# Patient Record
Sex: Male | Born: 1945 | Race: White | Hispanic: No | Smoking: Current every day smoker
Health system: Southern US, Community
[De-identification: ages and names within clinical notes are randomized; demographics above are authoritative.]

## PROBLEM LIST (undated history)

## (undated) DIAGNOSIS — R200 Anesthesia of skin: Secondary | ICD-10-CM

## (undated) DIAGNOSIS — I10 Essential (primary) hypertension: Secondary | ICD-10-CM

## (undated) DIAGNOSIS — F329 Major depressive disorder, single episode, unspecified: Secondary | ICD-10-CM

## (undated) DIAGNOSIS — I639 Cerebral infarction, unspecified: Secondary | ICD-10-CM

## (undated) DIAGNOSIS — J45909 Unspecified asthma, uncomplicated: Secondary | ICD-10-CM

## (undated) DIAGNOSIS — F32A Depression, unspecified: Secondary | ICD-10-CM

## (undated) DIAGNOSIS — X838XXA Intentional self-harm by other specified means, initial encounter: Secondary | ICD-10-CM

## (undated) HISTORY — PX: SHOULDER ARTHROSCOPY: SHX128

## (undated) HISTORY — PX: APPENDECTOMY: SHX54

## (undated) HISTORY — PX: TONSILLECTOMY: SUR1361

---

## 2005-05-19 ENCOUNTER — Ambulatory Visit (HOSPITAL_COMMUNITY): Admission: RE | Admit: 2005-05-19 | Discharge: 2005-05-19 | Payer: Self-pay | Admitting: Orthopedic Surgery

## 2005-05-19 ENCOUNTER — Ambulatory Visit (HOSPITAL_BASED_OUTPATIENT_CLINIC_OR_DEPARTMENT_OTHER): Admission: RE | Admit: 2005-05-19 | Discharge: 2005-05-19 | Payer: Self-pay | Admitting: Orthopedic Surgery

## 2010-07-29 ENCOUNTER — Inpatient Hospital Stay (HOSPITAL_COMMUNITY)
Admission: EM | Admit: 2010-07-29 | Discharge: 2010-08-04 | Disposition: A | Discharge status: Other Acute Inpatient Hospital | Payer: Self-pay | Source: Home / Self Care | Admitting: Emergency Medicine

## 2010-07-30 DIAGNOSIS — F329 Major depressive disorder, single episode, unspecified: Secondary | ICD-10-CM

## 2010-07-31 DIAGNOSIS — F329 Major depressive disorder, single episode, unspecified: Secondary | ICD-10-CM

## 2010-12-08 LAB — DIFFERENTIAL
Basophils Absolute: 0.1 10*3/uL (ref 0.0–0.1)
Eosinophils Relative: 3 % (ref 0–5)
Lymphocytes Relative: 27 % (ref 12–46)
Lymphs Abs: 3 10*3/uL (ref 0.7–4.0)
Neutro Abs: 7 10*3/uL (ref 1.7–7.7)
Neutrophils Relative %: 62 % (ref 43–77)

## 2010-12-08 LAB — MRSA CULTURE

## 2010-12-08 LAB — COMPREHENSIVE METABOLIC PANEL
ALT: 32 U/L (ref 0–53)
ALT: 33 U/L (ref 0–53)
ALT: 43 U/L (ref 0–53)
AST: 30 U/L (ref 0–37)
AST: 45 U/L — ABNORMAL HIGH (ref 0–37)
Albumin: 3.1 g/dL — ABNORMAL LOW (ref 3.5–5.2)
Alkaline Phosphatase: 67 U/L (ref 39–117)
Alkaline Phosphatase: 68 U/L (ref 39–117)
Alkaline Phosphatase: 80 U/L (ref 39–117)
BUN: 7 mg/dL (ref 6–23)
BUN: 9 mg/dL (ref 6–23)
BUN: 9 mg/dL (ref 6–23)
CO2: 24 mEq/L (ref 19–32)
CO2: 25 mEq/L (ref 19–32)
CO2: 29 mEq/L (ref 19–32)
CO2: 29 mEq/L (ref 19–32)
Calcium: 8.2 mg/dL — ABNORMAL LOW (ref 8.4–10.5)
Calcium: 8.9 mg/dL (ref 8.4–10.5)
Chloride: 102 mEq/L (ref 96–112)
Chloride: 104 mEq/L (ref 96–112)
Creatinine, Ser: 1.02 mg/dL (ref 0.4–1.5)
Creatinine, Ser: 1.05 mg/dL (ref 0.4–1.5)
GFR calc Af Amer: >60 mL/min (ref >60)
GFR calc Af Amer: >60 mL/min (ref >60)
GFR calc non Af Amer: >60 mL/min (ref >60)
GFR calc non Af Amer: >60 mL/min (ref >60)
GFR calc non Af Amer: >60 mL/min (ref >60)
Glucose, Bld: 141 mg/dL — ABNORMAL HIGH (ref 70–99)
Glucose, Bld: 168 mg/dL — ABNORMAL HIGH (ref 70–99)
Potassium: 3.8 mEq/L (ref 3.5–5.1)
Potassium: 4.5 mEq/L (ref 3.5–5.1)
Sodium: 137 mEq/L (ref 135–145)
Sodium: 138 mEq/L (ref 135–145)
Total Bilirubin: 0.4 mg/dL (ref 0.3–1.2)
Total Bilirubin: 0.8 mg/dL (ref 0.3–1.2)
Total Bilirubin: 0.9 mg/dL (ref 0.3–1.2)
Total Protein: 6 g/dL (ref 6.0–8.3)
Total Protein: 6.4 g/dL (ref 6.0–8.3)

## 2010-12-08 LAB — GLUCOSE, CAPILLARY
Glucose-Capillary: 154 mg/dL — ABNORMAL HIGH (ref 70–99)
Glucose-Capillary: 178 mg/dL — ABNORMAL HIGH (ref 70–99)
Glucose-Capillary: 215 mg/dL — ABNORMAL HIGH (ref 70–99)

## 2010-12-08 LAB — PROTIME-INR
INR: 0.97 (ref 0.00–1.49)
INR: 1.05 (ref 0.00–1.49)
INR: 1.13 (ref 0.00–1.49)
Prothrombin Time: 13 seconds (ref 11.6–15.2)
Prothrombin Time: 13.1 seconds (ref 11.6–15.2)
Prothrombin Time: 14.7 seconds (ref 11.6–15.2)

## 2010-12-08 LAB — HEPATIC FUNCTION PANEL
ALT: 35 U/L (ref 0–53)
AST: 28 U/L (ref 0–37)
Albumin: 3 g/dL — ABNORMAL LOW (ref 3.5–5.2)
Total Protein: 6.1 g/dL (ref 6.0–8.3)

## 2010-12-08 LAB — CBC
HCT: 45.5 % (ref 39.0–52.0)
Hemoglobin: 15.9 g/dL (ref 13.0–17.0)
Hemoglobin: 17.1 g/dL — ABNORMAL HIGH (ref 13.0–17.0)
MCH: 33.8 pg (ref 26.0–34.0)
MCHC: 35.3 g/dL (ref 30.0–36.0)
MCV: 95.9 fL (ref 78.0–100.0)
RBC: 4.76 MIL/uL (ref 4.22–5.81)
RDW: 12.2 % (ref 11.5–15.5)
WBC: 11.3 10*3/uL — ABNORMAL HIGH (ref 4.0–10.5)

## 2010-12-08 LAB — URINALYSIS, ROUTINE W REFLEX MICROSCOPIC
Bilirubin Urine: NEGATIVE
Glucose, UA: NEGATIVE mg/dL
Hgb urine dipstick: NEGATIVE
Specific Gravity, Urine: 1.019 (ref 1.005–1.030)
Urobilinogen, UA: 0.2 mg/dL (ref 0.0–1.0)
pH: 5.5 (ref 5.0–8.0)

## 2010-12-08 LAB — RAPID URINE DRUG SCREEN, HOSP PERFORMED
Barbiturates: NOT DETECTED
Opiates: POSITIVE — AB

## 2010-12-08 LAB — ACETAMINOPHEN LEVEL
Acetaminophen (Tylenol), Serum: 106.4 ug/mL — ABNORMAL HIGH (ref 10–30)
Acetaminophen (Tylenol), Serum: 90.9 ug/mL — ABNORMAL HIGH (ref 10–30)
Acetaminophen (Tylenol), Serum: <10 ug/mL — ABNORMAL LOW (ref 10–30)
Acetaminophen (Tylenol), Serum: <10 ug/mL — ABNORMAL LOW (ref 10–30)

## 2010-12-08 LAB — BASIC METABOLIC PANEL
CO2: 22 mEq/L (ref 19–32)
Calcium: 8.5 mg/dL (ref 8.4–10.5)
Creatinine, Ser: 1.03 mg/dL (ref 0.4–1.5)
GFR calc non Af Amer: >60 mL/min (ref >60)
Glucose, Bld: 157 mg/dL — ABNORMAL HIGH (ref 70–99)

## 2010-12-08 LAB — MRSA PCR SCREENING

## 2010-12-08 LAB — SALICYLATE LEVEL: Salicylate Lvl: <4 mg/dL (ref 2.8–20.0)

## 2010-12-08 LAB — TSH: TSH: 3.379 u[IU]/mL (ref 0.350–4.500)

## 2011-02-12 NOTE — Op Note (Signed)
NAMEDRAEDYN, WEIDINGER                ACCOUNT NO.:  [id number]   MEDICAL RECORD NO.:  [id number]          PATIENT TYPE:  AMB   LOCATION:  DSC                          FACILITY:  [facility]   PHYSICIAN:  Feliberto Gottron. Turner Daniels, M.D.   DATE OF BIRTH:  [birth year]   DATE OF PROCEDURE:  05/19/2005  DATE OF DISCHARGE:                                 OPERATIVE REPORT   PREOPERATIVE DIAGNOSIS:  Right shoulder impingement syndrome,  acromioclavicular joint arthritis, possible labral tear and possible  chondromalacia, also possible rotator cuff tear.   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   PROCEDURE:  Right shoulder arthroscopic anteroinferior acromioplasty, distal  clavicle excision, debridement of internal leaflet of rotator cuff tear,  debridement of partial biceps tear and labral tear, and debridement of  chondromalacia of the glenoid.   SURGEON:  Feliberto Gottron. Turner Daniels, M.D.   FIRST ASSISTANT:  Skip Mayer, PA-C.   ANESTHETIC:  Interscalene block with general endotracheal.   ESTIMATED BLOOD LOSS:  Minimal.   FLUID REPLACEMENT:  800 mL of crystalloid.   DRAINS PLACED:  None.   TOURNIQUET TIME:  None.   INDICATIONS FOR PROCEDURE:  [age]-year-old man who was injured at work  I believe a little over a year ago, did well initially with conservative  treatment for right shoulder impingement syndrome and unfortunately has had  a recurrence of the impingement pain over the last few months.  X-rays show  a type 3 subacromial spur about 8 mm in size, a subclavicular spur, also  quite sharp, AC joint arthritis and his symptoms are consistent with  impingement syndrome, AC joint arthritis, as well as possible labral tear or  articular cartilage tear of the right shoulder.   Risks and benefits of  surgical decompression are discussed with the patient as well as debridement  and he is prepared for surgical intervention.   DESCRIPTION OF PROCEDURE:  Patient identified by armband, taken the  operating room at [facility], appropriate anesthetic monitors  were attached and interscalene block anesthesia induced to the right upper  extremity.  This was followed by general endotracheal anesthesia and the  patient was placed in the beach chair position and the right upper extremity  prepped and draped in the usual sterile fashion from the wrist to the  hemithorax.  Using a #11 blade, we then made standard portals 1.5 cm  anterior to the [facility] joint, lateral to the junction of middle and posterior  thirds of the acromion and posterior to the posterolateral corner of the  acromion process.  The inflow was placed anteriorly, the arthroscope  laterally and a 4.2 great white sucker shaver posteriorly, allowing  subacromial bursectomy, debridement of some minor tearing of the insertion  of the rotator cuff.  Underwater hook Bovie was used to achieve coagulation  and we outlined the subacromial spur  after first releasing the CA ligament.  The spur was then removed  with a 4.5 hooded Vortex bur.  It was a sharp type  III  spur and required 2 full-thickness passes.  We also removed the  subclavicular spur, noted the [facility] joint was down to bare bone and proceeded  with a distal clavicle excision, bringing the hooded Vortex bur in  anteriorly, the scope posteriorly and the inflow laterally, allowing [place] to  remove the distal centimeter of the clavicle.  The arthroscope was then  repositioned into the glenohumeral joint using the posterior portal, where  we visualized some significant fraying and tearing of the superior labrum  and grade 3 chondromalacia with flap tears down to grade 4 in the inferior  glenoid; the labrum was intact down in this region.  The patient also  had  grade 2-3 humeral chondromalacia as well as flap tears that were removed.  The infraspinatus was noted to be intact.  The supraspinatus had some  internal leaflet fraying and was debrided, but no full-thickness tear was  noted.  At this point, the shoulder was irrigated out with normal saline  solution, the arthroscopic instruments were removed and a dressing of  Xeroform, 4x4 dressing sponges and Hypafix tape applied, followed by a  sling.  The patient was then laid supine, awakened and taken to the recovery  room without difficulty.      Feliberto Gottron. Turner Daniels, M.D.  Electronically Signed     FJR/MEDQ  D:  05/19/2005  T:  05/20/2005  Job:  [id number]

## 2011-03-15 ENCOUNTER — Emergency Department (HOSPITAL_COMMUNITY): Payer: Non-veteran care

## 2011-03-15 ENCOUNTER — Emergency Department (HOSPITAL_COMMUNITY)
Admission: EM | Admit: 2011-03-15 | Discharge: 2011-03-15 | Discharge status: Against Medical Advice | Payer: Non-veteran care | Attending: Emergency Medicine | Admitting: Emergency Medicine

## 2011-03-15 DIAGNOSIS — I10 Essential (primary) hypertension: Secondary | ICD-10-CM | POA: Insufficient documentation

## 2011-03-15 DIAGNOSIS — Z79899 Other long term (current) drug therapy: Secondary | ICD-10-CM | POA: Insufficient documentation

## 2011-03-15 DIAGNOSIS — F17200 Nicotine dependence, unspecified, uncomplicated: Secondary | ICD-10-CM | POA: Insufficient documentation

## 2011-03-15 DIAGNOSIS — R079 Chest pain, unspecified: Secondary | ICD-10-CM | POA: Insufficient documentation

## 2011-03-15 DIAGNOSIS — R209 Unspecified disturbances of skin sensation: Secondary | ICD-10-CM | POA: Insufficient documentation

## 2011-03-15 LAB — URINALYSIS, ROUTINE W REFLEX MICROSCOPIC
Hgb urine dipstick: NEGATIVE
Nitrite: NEGATIVE
Specific Gravity, Urine: 1.01 (ref 1.005–1.030)
Urobilinogen, UA: 0.2 mg/dL (ref 0.0–1.0)
pH: 5.5 (ref 5.0–8.0)

## 2011-03-15 LAB — PROTIME-INR: Prothrombin Time: 12.6 seconds (ref 11.6–15.2)

## 2011-03-15 LAB — COMPREHENSIVE METABOLIC PANEL
BUN: 12 mg/dL (ref 6–23)
Calcium: 9.2 mg/dL (ref 8.4–10.5)
Creatinine, Ser: 0.8 mg/dL (ref 0.50–1.35)
GFR calc Af Amer: >60 mL/min (ref >60)
Glucose, Bld: 93 mg/dL (ref 70–99)
Total Protein: 7.3 g/dL (ref 6.0–8.3)

## 2011-03-15 LAB — CBC
Hemoglobin: 18.3 g/dL — ABNORMAL HIGH (ref 13.0–17.0)
Platelets: 172 10*3/uL (ref 150–400)
RBC: 5.55 MIL/uL (ref 4.22–5.81)
WBC: 10 10*3/uL (ref 4.0–10.5)

## 2011-03-15 LAB — CK TOTAL AND CKMB (NOT AT ARMC)
CK, MB: 9.5 ng/mL (ref 0.3–4.0)
Total CK: 208 U/L (ref 7–232)

## 2011-04-19 NOTE — H&P (Signed)
Vincent [name], Vincent [name]                ACCOUNT NO.:  [id number]  MEDICAL RECORD NO.:  [id number]  LOCATION:  MCED                         FACILITY:  [facility]  PHYSICIAN:  Pearlean Brownie, M.D.DATE OF BIRTH:  28-Mar-1946  DATE OF ADMISSION:  03/15/2011 DATE OF DISCHARGE:  03/15/2011                             HISTORY & PHYSICAL   PRIMARY CARE PHYSICIAN:  [facility].  CHIEF COMPLAINTS:  Chest pain.  HISTORY OF PRESENT ILLNESS:  Left-sided stabbing chest pain started 1 week ago, nonradiating, nonexertional, or pleuritic or pleuritic associated with foods, typical, for 5-10 minutes, resolved spontaneously.  Also notes left arm decreased sensation from the elbow down.  No weakness, clumsiness.  No slurred speech, no tingling.  No radicular symptoms.  No cough, no fevers, no chills.  No belly pain.  No vomiting, no diarrhea.  Feels well otherwise.  PAST MEDICAL HISTORY:  Only notes hypertension and smoking.  ALLERGIES:  No known drug allergies.  MEDICATIONS:  Lisinopril, takes 20 mg a day.  SURGICAL HISTORY:  No chest surgeries.  SOCIAL HISTORY:  Smokes a pack a day.  Drinks a beer a day.  No drugs. Works as a Electrical engineer, was retired.  FAMILY HISTORY:  Father died at age 10 years' old due to kidney cancer. Mother died in her 32s due to ovarian cancer.  REVIEW OF SYSTEMS:  Please see HPI, otherwise normal.  PHYSICAL EXAMINATION:  VITAL SIGNS:  Temperature 98.5, heart rate 71, respiratory rate 18, blood pressure 140 to 158 over 78 to 88, satting 90% on room air. GENERAL:  No acute distress. HEENT:  Flat neck veins.  Extraocular motion intact.  Pupils equal, round, reactive to light. LUNGS:  Lungs are clear to auscultation bilaterally with normal work of breathing. HEART:  Regular rate and rhythm.  No murmurs, rubs, or gallops. ABDOMEN:  Soft, nontender, nondistended.  Normoactive bowel sounds. BACK:  Nontender. EXTREMITIES:  Nonedematous. NEURO:  Alert and  oriented x3.  Cranial nerves II-XII are normal. Sensation in the left arm from the elbow down is reduced compared to the right subjectively.  Strength is 5/5 bilaterally.  Reflexes are equal bilaterally.  Negative Romberg, negative pronator drift.  LAB AND STUDIES:  White count is 10, hemoglobin is 18.3, platelets are 172.  Basic metabolic panel is normal, normal values.  Troponin is negative.  Urinalysis is normal appearing.  Chest x-ray shows nothing acute.  EKG shows normal sinus rhythm at 96 with a partial fascicular block, no ST changes.  Not much change from prior study.  ASSESSMENT/PLAN:  This is a 65 year old smoker with chest pain and left arm numbness. 1. Chest pain, atypical.  EKG unchanged.  Cardiac enzymes are     negative.  I think acute coronary syndrome is unlikely, but we will     cycle enzymes and follow telemetry and order EKG in the morning.     We will obtain a fasting lipid panel and TSH as well.  We will     follow. 2. Arm numbness.  Think this may be a stroke as it has been present     inconsistent, not typical for radicular neck issues.  We will get  a     noncontrast head CT scan to rule out a bleed and evaluate for old     infarct.  We will also evaluate labs above and with carotid     Dopplers and an echocardiogram.  We will hold on an MRI at this     time.  We will decide tomorrow if we need it, may be able to follow     this as an outpatient. 3. Hypertension, elevated.  I doubt his home lisinopril is sufficient     to adequately control his blood pressure.  Plan to continue his     lisinopril and hydrochlorothiazide, 20 and 25 will be continued     will be a convenient dose with accommodation pill at discharge if     this blood pressure is doing okay on this combo.  We will  follow. 4. Polycythemia, likely due to smoking.  We advised tobacco cessation.     We will check liver panel to treat any liver disease, may be this     might be due to  hemochromatosis. 5. Smoking.  Tobacco cessation counseling ordered and the patient     declines nicotine patch at this time. 6. Prophylaxis use heparin and diet. 7. Code, full code. 8. Disposition.  When he is ruled out, he may go home.     Clementeen Graham, MD   ______________________________ Pearlean Brownie, M.D.    EC/MEDQ  D:  03/15/2011  T:  03/16/2011  Job:  [id number]  Electronically Signed by Clementeen Graham  on 04/05/2011 07:53:25 AM Electronically Signed by Pearlean Brownie M.D. on 04/19/2011 01:58:50 PM

## 2011-04-28 DIAGNOSIS — X838XXA Intentional self-harm by other specified means, initial encounter: Secondary | ICD-10-CM

## 2011-04-28 HISTORY — DX: Intentional self-harm by other specified means, initial encounter: X83.8XXA

## 2011-05-29 ENCOUNTER — Emergency Department (HOSPITAL_COMMUNITY)
Admission: EM | Admit: 2011-05-29 | Discharge: 2011-05-30 | Disposition: A | Discharge status: Other Acute Inpatient Hospital | Payer: Non-veteran care | Attending: Emergency Medicine | Admitting: Emergency Medicine

## 2011-05-29 DIAGNOSIS — S66909A Unspecified injury of unspecified muscle, fascia and tendon at wrist and hand level, unspecified hand, initial encounter: Secondary | ICD-10-CM | POA: Insufficient documentation

## 2011-05-29 DIAGNOSIS — F329 Major depressive disorder, single episode, unspecified: Secondary | ICD-10-CM | POA: Insufficient documentation

## 2011-05-29 DIAGNOSIS — I10 Essential (primary) hypertension: Secondary | ICD-10-CM | POA: Insufficient documentation

## 2011-05-29 DIAGNOSIS — Z8673 Personal history of transient ischemic attack (TIA), and cerebral infarction without residual deficits: Secondary | ICD-10-CM | POA: Insufficient documentation

## 2011-05-29 DIAGNOSIS — F3289 Other specified depressive episodes: Secondary | ICD-10-CM | POA: Insufficient documentation

## 2011-05-29 DIAGNOSIS — X789XXA Intentional self-harm by unspecified sharp object, initial encounter: Secondary | ICD-10-CM | POA: Insufficient documentation

## 2011-05-29 DIAGNOSIS — S61509A Unspecified open wound of unspecified wrist, initial encounter: Secondary | ICD-10-CM | POA: Insufficient documentation

## 2011-05-29 LAB — COMPREHENSIVE METABOLIC PANEL
ALT: 31 U/L (ref 0–53)
AST: 25 U/L (ref 0–37)
CO2: 25 mEq/L (ref 19–32)
Calcium: 9.1 mg/dL (ref 8.4–10.5)
Chloride: 99 mEq/L (ref 96–112)
GFR calc non Af Amer: >60 mL/min (ref >60)
Potassium: 3.6 mEq/L (ref 3.5–5.1)
Sodium: 136 mEq/L (ref 135–145)
Total Bilirubin: 0.5 mg/dL (ref 0.3–1.2)

## 2011-05-29 LAB — DIFFERENTIAL
Basophils Absolute: 0 10*3/uL (ref 0.0–0.1)
Eosinophils Absolute: 0.3 10*3/uL (ref 0.0–0.7)
Lymphocytes Relative: 25 % (ref 12–46)
Lymphs Abs: 2.5 10*3/uL (ref 0.7–4.0)
Neutrophils Relative %: 64 % (ref 43–77)

## 2011-05-29 LAB — CBC
HCT: 44.6 % (ref 39.0–52.0)
MCV: 90.1 fL (ref 78.0–100.0)
Platelets: 192 10*3/uL (ref 150–400)
RBC: 4.95 MIL/uL (ref 4.22–5.81)
RDW: 12.5 % (ref 11.5–15.5)
WBC: 9.9 10*3/uL (ref 4.0–10.5)

## 2011-05-29 LAB — RAPID URINE DRUG SCREEN, HOSP PERFORMED
Barbiturates: NOT DETECTED
Tetrahydrocannabinol: NOT DETECTED

## 2011-05-30 ENCOUNTER — Inpatient Hospital Stay (HOSPITAL_COMMUNITY)
Admission: RE | Admit: 2011-05-30 | Discharge: 2011-06-02 | DRG: 897 | Disposition: A | Discharge status: Home / Self Care | Payer: PRIVATE HEALTH INSURANCE | Source: Ambulatory Visit | Attending: Psychiatry | Admitting: Psychiatry

## 2011-05-30 DIAGNOSIS — S61509A Unspecified open wound of unspecified wrist, initial encounter: Secondary | ICD-10-CM

## 2011-05-30 DIAGNOSIS — F329 Major depressive disorder, single episode, unspecified: Secondary | ICD-10-CM

## 2011-05-30 DIAGNOSIS — Z5900 Homelessness unspecified: Secondary | ICD-10-CM

## 2011-05-30 DIAGNOSIS — F1994 Other psychoactive substance use, unspecified with psychoactive substance-induced mood disorder: Secondary | ICD-10-CM

## 2011-05-30 DIAGNOSIS — X838XXA Intentional self-harm by other specified means, initial encounter: Secondary | ICD-10-CM

## 2011-05-30 DIAGNOSIS — Z91012 Allergy to eggs: Secondary | ICD-10-CM

## 2011-05-30 DIAGNOSIS — F3289 Other specified depressive episodes: Secondary | ICD-10-CM

## 2011-05-30 DIAGNOSIS — R0789 Other chest pain: Secondary | ICD-10-CM

## 2011-05-30 DIAGNOSIS — F17200 Nicotine dependence, unspecified, uncomplicated: Secondary | ICD-10-CM

## 2011-05-30 DIAGNOSIS — F1020 Alcohol dependence, uncomplicated: Principal | ICD-10-CM

## 2011-05-30 DIAGNOSIS — I10 Essential (primary) hypertension: Secondary | ICD-10-CM

## 2011-05-31 DIAGNOSIS — F329 Major depressive disorder, single episode, unspecified: Secondary | ICD-10-CM

## 2011-06-03 NOTE — Assessment & Plan Note (Signed)
Vincent [name], Vincent [name]                ACCOUNT NO.:  [id number]  MEDICAL RECORD NO.:  [id number]  LOCATION:  0305                          [facility]ACILITY:  [facility]  PHYSICIAN:  Orson Aloe, MD       DATE OF BIRTH:  04-28-1946  DATE OF ADMISSION:  05/30/2011 DATE OF DISCHARGE:                      PSYCHIATRIC ADMISSION ASSESSMENT   IDENTIFICATION:  This is a 65 year old male who was voluntarily admitted on May 30, 2011.  HISTORY OF PRESENT ILLNESS:  The patient presents with a history of a self-inflicted injury.  He cut himself with a hunting knife.  Both lacerations did require suturing.  He states he did this due to an accumulation of events and was hoping to "end his problem," reporting "that was the plan".  He was at his ex-wife's home, and she had called EMS and the patient was further assessed in the ED.  The patient reports significant stressors of being homeless.  He has been recently living in his car.  He is having some medical issues and problems with employment and finances.  He has been sleeping well and his has been satisfactory. He denies any psychotic symptoms and reports he does drink, drinking some beers on a daily basis but does not feel alcohol is a problem.  PAST PSYCHIATRIC HISTORY:  First admission to the [facility].  He has overdosed about 6 months ago and was hospitalized at the [place].  He has no current outpatient mental health treatment.  SOCIAL HISTORY:  The patient has been residing in [place].  He has 3 adult children and one 47 year old and has been married 3 times.  FAMILY HISTORY:  None.  ALCOHOL/DRUG HISTORY:  The patient smokes and drinks beer most days and denies any seizures or withdrawal symptoms.  [facility]  MEDICAL PROBLEMS: 1. History of hypertension. 2. He recently was in the hospital for atypical chest pain.  MEDICATIONS:  The patient lists Proventil as needed for dyspnea and lisinopril 20 mg daily for  blood pressure.  DRUG ALLERGIES:  Flu shot.  The patient reports allergic to eggs.  PHYSICAL EXAM:  Physical exam was done at [facility].  This is a middle-aged male.  He appears well-nourished. His physical exam was reviewed.  The patient had 5 cm and 7 cm lacerations to both wrists that were sutured.  He received a tetanus injection.  LABORATORY DATA:  His glucose was 121.  His alcohol level at that time was 103.  Urine drug screen was negative.  MENTAL STATUS EXAM:  He is fully alert and cooperative.  He is dressed in his own clothing.  He is casually dressed.  Intermittent eye contact. His speech is clear, normal pace and tone, not rapid and not pressured. His mood is euthymic.  He has constricted affect and seems to have minimal insight in regards to alcohol use and medical issues.  His thought processes overall though are coherent and goal-directed.  No evidence any psychotic symptoms and does not appear to be responding to internal stimuli.  Cognitive function is intact.  Memory is intact. Judgment and insight are fair.  DIAGNOSES:  Axis I:  Depressive disorder, not otherwise specified.  Rule out alcohol abuse. Axis II:  Deferred. Axis III:  Hypertension and atypical chest pain. Axis IV:  Financial issues, occupational problems, and possible problems related to housing. Axis V:  35.  PLAN:  Our plan is to continue with the patient's lisinopril and his Proventil inhaler.  We will have Librium available on a p.r.n. basis. The patient is encouraged to attend groups.  We will continue to assess comorbidities and assist the patient with aftercare.  His tentative length of stay at this time is 3-4 days.     Landry Corporal, N.P.   ______________________________ Orson Aloe, MD    JO/MEDQ  D:  05/31/2011  T:  05/31/2011  Job:  [id number]  Electronically Signed by Limmie PatriciaP. on 05/31/2011 02:55:48 PM Electronically Signed by Orson Aloe  on  06/03/2011 04:31:51 PM

## 2011-06-04 ENCOUNTER — Emergency Department (HOSPITAL_COMMUNITY)
Admission: EM | Admit: 2011-06-04 | Discharge: 2011-06-05 | Disposition: A | Discharge status: Home / Self Care | Payer: Non-veteran care | Attending: Emergency Medicine | Admitting: Emergency Medicine

## 2011-06-04 DIAGNOSIS — Z5900 Homelessness unspecified: Secondary | ICD-10-CM | POA: Insufficient documentation

## 2011-06-04 DIAGNOSIS — Z8673 Personal history of transient ischemic attack (TIA), and cerebral infarction without residual deficits: Secondary | ICD-10-CM | POA: Insufficient documentation

## 2011-06-04 DIAGNOSIS — F329 Major depressive disorder, single episode, unspecified: Secondary | ICD-10-CM | POA: Insufficient documentation

## 2011-06-04 DIAGNOSIS — F3289 Other specified depressive episodes: Secondary | ICD-10-CM | POA: Insufficient documentation

## 2011-06-04 DIAGNOSIS — R45851 Suicidal ideations: Secondary | ICD-10-CM | POA: Insufficient documentation

## 2011-06-04 DIAGNOSIS — I10 Essential (primary) hypertension: Secondary | ICD-10-CM | POA: Insufficient documentation

## 2011-06-04 LAB — CBC
HCT: 46.2 % (ref 39.0–52.0)
Hemoglobin: 16.5 g/dL (ref 13.0–17.0)
MCV: 91.1 fL (ref 78.0–100.0)
RDW: 12.6 % (ref 11.5–15.5)
WBC: 10.4 10*3/uL (ref 4.0–10.5)

## 2011-06-04 LAB — URINALYSIS, ROUTINE W REFLEX MICROSCOPIC
Bilirubin Urine: NEGATIVE
Ketones, ur: NEGATIVE mg/dL
Nitrite: NEGATIVE
Protein, ur: NEGATIVE mg/dL
pH: 6 (ref 5.0–8.0)

## 2011-06-04 LAB — ETHANOL: Alcohol, Ethyl (B): <11 mg/dL (ref 0–11)

## 2011-06-04 LAB — COMPREHENSIVE METABOLIC PANEL
Alkaline Phosphatase: 98 U/L (ref 39–117)
BUN: 14 mg/dL (ref 6–23)
CO2: 29 mEq/L (ref 19–32)
Chloride: 102 mEq/L (ref 96–112)
Creatinine, Ser: 1.05 mg/dL (ref 0.50–1.35)
GFR calc Af Amer: >60 mL/min (ref >60)
GFR calc non Af Amer: >60 mL/min (ref >60)
Glucose, Bld: 162 mg/dL — ABNORMAL HIGH (ref 70–99)
Potassium: 4 mEq/L (ref 3.5–5.1)
Total Bilirubin: 0.4 mg/dL (ref 0.3–1.2)

## 2011-06-04 LAB — DIFFERENTIAL
Basophils Absolute: 0.1 10*3/uL (ref 0.0–0.1)
Eosinophils Relative: 3 % (ref 0–5)
Lymphocytes Relative: 24 % (ref 12–46)
Lymphs Abs: 2.4 10*3/uL (ref 0.7–4.0)
Neutro Abs: 6.8 10*3/uL (ref 1.7–7.7)

## 2011-06-04 LAB — RAPID URINE DRUG SCREEN, HOSP PERFORMED
Amphetamines: NOT DETECTED
Barbiturates: NOT DETECTED
Benzodiazepines: NOT DETECTED
Cocaine: NOT DETECTED

## 2011-06-04 LAB — URINE MICROSCOPIC-ADD ON

## 2011-06-05 DIAGNOSIS — F329 Major depressive disorder, single episode, unspecified: Secondary | ICD-10-CM

## 2011-06-07 NOTE — Consult Note (Signed)
  Vincent [name], Vincent [name]                ACCOUNT NO.:  [id number]  MEDICAL RECORD NO.:  [id number]  LOCATION:  WLED                         [facility]ACILITY:  [facility]  PHYSICIAN:  Eulogio Ditch, MD DATE OF BIRTH:  [birth year]  DATE OF CONSULTATION: DATE OF DISCHARGE:                                CONSULTATION   HISTORY OF PRESENT ILLNESS:  I saw the patient and reviewed the clinical assessment.  Briefly, 65 year old Caucasian male, who was recently discharged from [facility] was feeling frustrated as the plan where he was to go and made him fall through on the same day.  The patient also stated that they asked him for the credit card information and he has a zero history of credit card because he never borrowed any money.  He always buy things on the cash, but the patient now wants to go and stay with the aunt.  The patient denied any suicidal or homicidal ideations. He is very logical and goal directed, not hallucinating or delusional. Cognition:  Alert, awake, oriented x3.  Memory:  Immediate, recent, remote fair.  Attention and concentration good.  Abstraction ability good.  Insight and judgment intact.  The patient has appointment at [facility] on June 13, 2011.  I again asked the patient that if he need inpatient admission.  I can admit him, but the patient stated that he is feeling much better.  He is not feeling suicidal and he is in control of himself and can stay with the aunt for temporarily until he gets another place.  DIAGNOSES:  At the time of discharge: AXIS I:  Nondepressive disorder, not otherwise specified. AXIS II:  Deferred. AXIS III:  Hypertension. AXIS IV:  Psychosocial stressors. AXIS V:  60  RECOMMENDATIONS:  The patient can be discharged to follow up in the outpatient setting.  At this time, the patient does not meet criteria to be admitted on IVC.     Eulogio Ditch, MD     SA/MEDQ  D:  06/05/2011  T:  06/05/2011  Job:  [id number]  Electronically Signed by  Eulogio Ditch  on 06/07/2011 01:05:08 PM

## 2011-06-09 ENCOUNTER — Emergency Department (HOSPITAL_COMMUNITY)
Admission: EM | Admit: 2011-06-09 | Discharge: 2011-06-09 | Disposition: A | Discharge status: Home / Self Care | Payer: Non-veteran care | Attending: Emergency Medicine | Admitting: Emergency Medicine

## 2011-06-09 DIAGNOSIS — Z4802 Encounter for removal of sutures: Secondary | ICD-10-CM | POA: Insufficient documentation

## 2011-06-25 NOTE — Discharge Summary (Signed)
Vincent [name], Vincent [name]                ACCOUNT NO.:  [id number]  MEDICAL RECORD NO.:  [id number]  LOCATION:  0302                          [facility]ACILITY:  [facility]  PHYSICIAN:  Orson Aloe, MD       DATE OF BIRTH:  Feb 25, 1946  DATE OF ADMISSION:  05/30/2011 DATE OF DISCHARGE:  06/02/2011                              DISCHARGE SUMMARY   IDENTIFYING INFORMATION:  This is a 65 year old male.  This is a voluntary admission.  HISTORY OF PRESENT ILLNESS:  This was the first admission for Vincent [name] who presented with a history of cutting himself with a hunting knife and required suturing.  He said that it was due to an accumulation of events and was hoping to "end his problems."  He had been living out of his car, also had some medical issues and problems with employment and finances.  He reported that he had been sleeping well, was drinking beers on a daily basis but did not feel as alcohol was a problem.  He had a history of previous overdose 6 months prior and was hospitalized in [place] and was currently receiving no outpatient mental health treatment.  MEDICAL EVALUATION:  He was [place] in the [facility] Emergency Department.  He is a normally developed, well nourished male in no acute physical distress other than 5 cm and 7 cm lacerations to both wrists requiring sutures.  He was also given tetanus prophylaxis at that time.  Alcohol level was noted to be 103.  Other diagnostic studies were unremarkable.  COURSE OF HOSPITALIZATION:  He was admitted to our dual diagnosis unit given a provisional diagnosis of depressive disorder not otherwise specified, and rule out alcohol abuse.  We continued the patient's lisinopril and Proventil inhaler which he took at home.  He was also started on a Librium detox protocol due to his history of daily alcohol use.  In discharge planning, he reported that he was going to live with his brother when he left and would not return to the  [facility] where he had previously been staying.  He said his depression had gotten worse over the past week and that he had been self-medicating by drinking.  He was intoxicated when he attempted to cut himself.  He requested outpatient treatment so he could return to work.  By the 5th he was rating his depression and anxiety zero on a scale of 1-10 if 10 was the worst depression.  [facility].  DISCHARGE/PLAN:  Follow up at the [facility] on June 15, 2011 at 9:00 a.m.  DISCHARGE DIAGNOSES:  Axis I: 1. Alcohol dependence with recent relapse. 2. Substance-induced mood disorder. Axis II:  Deferred. Axis III:  Self-inflicted lacerations bilateral wrists, hypertension. Axis IV:  Moderate psychosocial stressors. Axis V:  Current 55.  DISCHARGE CONDITION:  Is improved.  DISCHARGE MEDICATIONS:  None.     Margaret A. Lorin Picket, N.P.   ______________________________ Orson Aloe, MD    MAS/MEDQ  D:  06/24/2011  T:  06/24/2011  Job:  [id number]  Electronically Signed by Kari Baars N.P. on 06/24/2011 04:54:09  PM Electronically Signed by Orson Aloe  on 06/25/2011 11:38:27 AM

## 2012-06-17 ENCOUNTER — Encounter (HOSPITAL_COMMUNITY): Payer: Self-pay | Admitting: Emergency Medicine

## 2012-06-17 ENCOUNTER — Emergency Department (HOSPITAL_COMMUNITY)
Admission: EM | Admit: 2012-06-17 | Discharge: 2012-06-18 | Disposition: A | Discharge status: Home / Self Care | Payer: Medicare Other | Attending: Emergency Medicine | Admitting: Emergency Medicine

## 2012-06-17 DIAGNOSIS — Z8673 Personal history of transient ischemic attack (TIA), and cerebral infarction without residual deficits: Secondary | ICD-10-CM | POA: Insufficient documentation

## 2012-06-17 DIAGNOSIS — F329 Major depressive disorder, single episode, unspecified: Secondary | ICD-10-CM | POA: Insufficient documentation

## 2012-06-17 DIAGNOSIS — E119 Type 2 diabetes mellitus without complications: Secondary | ICD-10-CM | POA: Insufficient documentation

## 2012-06-17 DIAGNOSIS — Z9089 Acquired absence of other organs: Secondary | ICD-10-CM | POA: Insufficient documentation

## 2012-06-17 DIAGNOSIS — I10 Essential (primary) hypertension: Secondary | ICD-10-CM | POA: Insufficient documentation

## 2012-06-17 DIAGNOSIS — F3289 Other specified depressive episodes: Secondary | ICD-10-CM | POA: Insufficient documentation

## 2012-06-17 HISTORY — DX: Cerebral infarction, unspecified: I63.9

## 2012-06-17 HISTORY — DX: Major depressive disorder, single episode, unspecified: F32.9

## 2012-06-17 HISTORY — DX: Essential (primary) hypertension: I10

## 2012-06-17 HISTORY — DX: Depression, unspecified: F32.A

## 2012-06-17 HISTORY — DX: Intentional self-harm by other specified means, initial encounter: X83.8XXA

## 2012-06-17 LAB — COMPREHENSIVE METABOLIC PANEL
AST: 29 U/L (ref 0–37)
Albumin: 4.2 g/dL (ref 3.5–5.2)
Calcium: 9.7 mg/dL (ref 8.4–10.5)
Creatinine, Ser: 1.04 mg/dL (ref 0.50–1.35)
Sodium: 137 mEq/L (ref 135–145)

## 2012-06-17 LAB — RAPID URINE DRUG SCREEN, HOSP PERFORMED
Amphetamines: NOT DETECTED
Benzodiazepines: NOT DETECTED
Cocaine: NOT DETECTED
Opiates: NOT DETECTED

## 2012-06-17 LAB — CBC WITH DIFFERENTIAL/PLATELET
Basophils Absolute: 0.1 10*3/uL (ref 0.0–0.1)
Basophils Relative: 0 % (ref 0–1)
Eosinophils Relative: 2 % (ref 0–5)
HCT: 52.7 % — ABNORMAL HIGH (ref 39.0–52.0)
MCHC: 34.9 g/dL (ref 30.0–36.0)
MCV: 93.8 fL (ref 78.0–100.0)
Monocytes Absolute: 0.9 10*3/uL (ref 0.1–1.0)
Neutro Abs: 12.1 10*3/uL — ABNORMAL HIGH (ref 1.7–7.7)
Platelets: 242 10*3/uL (ref 150–400)
RDW: 12.9 % (ref 11.5–15.5)

## 2012-06-17 LAB — ACETAMINOPHEN LEVEL: Acetaminophen (Tylenol), Serum: <15 ug/mL (ref 10–30)

## 2012-06-17 MED ORDER — ONDANSETRON HCL 4 MG PO TABS: 4.0000 mg | ORAL_TABLET | Freq: Three times a day (TID) | ORAL | Status: DC | PRN

## 2012-06-17 MED ORDER — LORAZEPAM 1 MG PO TABS: 1.0000 mg | ORAL_TABLET | Freq: Three times a day (TID) | ORAL | Status: DC | PRN

## 2012-06-17 MED ORDER — IBUPROFEN 600 MG PO TABS: 600.0000 mg | ORAL_TABLET | Freq: Three times a day (TID) | ORAL | Status: DC | PRN

## 2012-06-17 MED ORDER — ZOLPIDEM TARTRATE 5 MG PO TABS: 5.0000 mg | ORAL_TABLET | Freq: Every evening | ORAL | Status: DC | PRN

## 2012-06-17 MED ORDER — LISINOPRIL 20 MG PO TABS
20.0000 mg | ORAL_TABLET | Freq: Every day | ORAL | Status: DC
  Administered 2012-06-18: 20 mg via ORAL
  Filled 2012-06-17 (×3): qty 1

## 2012-06-17 MED ORDER — ALUM & MAG HYDROXIDE-SIMETH 200-200-20 MG/5ML PO SUSP: 30.0000 mL | ORAL | Status: DC | PRN

## 2012-06-17 MED ORDER — NICOTINE 21 MG/24HR TD PT24
21.0000 mg | MEDICATED_PATCH | Freq: Every day | TRANSDERMAL | Status: DC
  Filled 2012-06-17: qty 1

## 2012-06-17 NOTE — ED Notes (Signed)
Pt w/ hx of  Suicide attempts presents w/ 3 day hx of increased depression. [facility]. Pt states he is homeless again. Most recent suicide attempt August 2012

## 2012-06-17 NOTE — ED Notes (Signed)
ACT into see 

## 2012-06-17 NOTE — ED Provider Notes (Signed)
History     CSN: [id number]  Arrival date & time 06/17/12  1505   First MD Initiated Contact with Patient 06/17/12 1549      Chief Complaint  Patient presents with  . Medical Clearance    (Consider location/radiation/quality/duration/timing/severity/associated sxs/prior treatment) HPI  66 y.o. -year-old male in no acute distress accompanied by his brother sent here by [place] with increasing depression over the last 3 days. He states he is depressed because he is having trouble securing a place to live and finding a job. Patient denies any current suicidal or homicidal ideations, hallucinations. Patient denies any physical complaints including chest pain, shortness of breath, nausea vomiting. States that he does not take any illicit drugs however he does state that he drinks a variable amount per day. He only drinks beer. States that he has never had alcohol withdrawal. States that he can go for days to weeks without drinking without incident.  Past Medical History  Diagnosis Date  . Depression   . Suicide and self-inflicted injury Aug, 2012    cut wrist  . Diabetes mellitus   . Stroke   . Hypertension     Past Surgical History  Procedure Date  . Appendectomy   . Tonsillectomy   . Shoulder arthroscopy     No family history on file.  History  Substance Use Topics  . Smoking status: Current Every Day Smoker -- 1.0 packs/day    Types: Cigarettes  . Smokeless tobacco: Never Used  . Alcohol Use: 54.0 oz/week    90 Cans of beer per week      Review of Systems  Constitutional: Negative for fever.  Respiratory: Negative for shortness of breath.   Cardiovascular: Negative for chest pain.  Gastrointestinal: Negative for nausea, vomiting, abdominal pain and diarrhea.  All other systems reviewed and are negative.    Allergies  Fluzone  Home Medications   Current Outpatient Rx  Name Route Sig Dispense Refill  . LISINOPRIL 20 MG PO TABS Oral Take 20 mg by mouth daily.        BP 120/84  Pulse 111  Temp 98.9 F (37.2 C) (Oral)  Resp 20  SpO2 98%  Physical Exam  Nursing note and vitals reviewed. Constitutional: He is oriented to person, place, and time. He appears well-developed and well-nourished. No distress.  HENT:  Head: Normocephalic.  Mouth/Throat: No oropharyngeal exudate.       Dry mucous membranes.  Eyes: Conjunctivae normal and EOM are normal. Pupils are equal, round, and reactive to light.  Neck: Normal range of motion. No JVD present.  Cardiovascular: Regular rhythm, normal heart sounds and intact distal pulses.        Mild tachycardia.  Pulmonary/Chest: Effort normal and breath sounds normal. No stridor.  Abdominal: Soft. Bowel sounds are normal.  Musculoskeletal: Normal range of motion.  Neurological: He is alert and oriented to person, place, and time.       Cranial nerves III through XII intact, strength 5 out of 5x4 extremities, negative pronator drift, finger to nose and heel-to-shin coordinated, sensation intact to pinprick and light touch, gait is coordinated and Romberg is negative.    Psychiatric: He has a normal mood and affect. His behavior is normal. Judgment and thought content normal.    ED Course  Procedures (including critical care time)  Labs Reviewed  CBC WITH DIFFERENTIAL - Abnormal; Notable for the following:    WBC 16.0 (*)     Hemoglobin 18.4 (*)  HCT 52.7 (*)     Neutro Abs 12.1 (*)     All other components within normal limits  COMPREHENSIVE METABOLIC PANEL - Abnormal; Notable for the following:    Glucose, Bld 103 (*)     GFR calc non Af Amer 73 (*)     GFR calc Af Amer 85 (*)     All other components within normal limits  SALICYLATE LEVEL - Abnormal; Notable for the following:    Salicylate Lvl <2.0 (*)     All other components within normal limits  ETHANOL  ACETAMINOPHEN LEVEL  URINE RAPID DRUG SCREEN (HOSP PERFORMED)   No results found.   1. Depression       MDM  66 y.o. male in no  acute distress complaining of increasing depression over the last 2 days secondary to monetary and house and concerns. Patient denies any current suicidal or homicidal ideations positions, illicit drug use or alcohol abuse. However patient has a history of suicide attempt last one was within one year.  Patient slightly tachycardic and looking at his H&H is likely because he is dehydrated. I encouraged patient to hydrate by mouth: he is not nauseous or vomiting he does not require IV hydration at this time.  Patient is medically cleared for psychiatric evaluation.       Wynetta Emery, PA-C 06/17/12 1826

## 2012-06-17 NOTE — ED Notes (Signed)
Patient placed in paper scrubs and belongings placed in 1 bag. Patient and belongings checked by security.

## 2012-06-17 NOTE — ED Notes (Signed)
Pt arrived to unit ambulatory

## 2012-06-17 NOTE — ED Notes (Signed)
Pt denies SI at this time. 

## 2012-06-17 NOTE — BH Assessment (Signed)
Assessment Note   Vincent [name] is an 66 y.o. male who presents voluntarily to [facility] with chief complaint of depression and SI with fleeting plan. Pt has 2 prior suicide attempts (2011 overdose vicodin & Nov 2012 cut forearms w/ hunting knife). Pt sts he thought about walking into traffic today but decided to come to [facility] instead. Pt endorses depressive symptoms of despondency, worthlessness and loss of interest in usual pleasures. Pt sts, "I have bouts of depression. They come and go". He was [facility] once Nov 2012 and at the [place] for OD in 2011. Current stressors include being homeless (was living in [place] but now living on streets and not being able to find a job. Pt sts he had "mini stroke" last yr but says his MD didn't agree and subsequently that his left hand is often numb. He denies HI. He denies AVH and no delusions noted. Pt has been drinking for past 7 days with last use 06/16/12 - 10 12 oz beers. He denies substance abuse.  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 Axis II: Deferred Axis III:  Past Medical History  Diagnosis Date  . Depression   . Suicide and self-inflicted injury Aug, 2012    cut wrist  . Diabetes mellitus   . Stroke   . Hypertension    Axis IV: economic problems, housing problems, other psychosocial or environmental problems, problems related to social environment and problems with primary support group Axis V: 31-40 impairment in reality testing  Past Medical History:  Past Medical History  Diagnosis Date  . Depression   . Suicide and self-inflicted injury Aug, 2012    cut wrist  . Diabetes mellitus   . Stroke   . Hypertension     Past Surgical History  Procedure Date  . Appendectomy   . Tonsillectomy   . Shoulder arthroscopy     Family History: No family history on file.  Social History:  reports that he has been smoking Cigarettes.  He has been smoking about 1 pack per day. He has never used smokeless tobacco.  He reports that he drinks about 54 ounces of alcohol per week. He reports that he does not use illicit drugs.  Additional Social History:  Alcohol / Drug Use Pain Medications: n/a Prescriptions: see PTA meds Over the Counter:  n/a History of alcohol / drug use?: Yes Substance #1 Name of Substance 1: alcohol 1 - Age of First Use: teens 1 - Amount (size/oz): varies 1 - Duration: daily for 7 days until 06/16/12 1 - Last Use / Amount: 06/16/12 - Ten 12 oz beers  CIWA: CIWA-Ar BP: 147/89 mmHg Pulse Rate: 82  COWS:    Allergies:  Allergies  Allergen Reactions  . Fluzone (Flu Virus Vaccine) Other (See Comments)    Home Medications:  (Not in a hospital admission)  OB/GYN Status:  No LMP for male patient.  General Assessment Data Location of Assessment: [facility] Living Arrangements: Other (Comment) (homeless, on the street) Can pt return to current living arrangement?: Yes Admission Status: Voluntary Is patient capable of signing voluntary admission?: Yes Transfer from: Acute Hospital Referral Source: Self/Family/Friend  Education Status Is patient currently in school?: No Current Grade: na Highest grade of school patient has completed: 73 Name of school: [employer]  Risk to self Suicidal Ideation: Yes-Currently Present Suicidal Intent: No Is patient at risk for suicide?: Yes Suicidal Plan?: No (pt denies intent but sts he did consider walking into traffc) Access to  Means: Yes Specify Access to Suicidal Means: traffic What has been your use of drugs/alcohol within the last 12 months?: alchol for past 7 days Previous Attempts/Gestures: Yes How many times?: 2  (2011-overdose vicodin & 2012 - cut arms w/ hunting knife) Other Self Harm Risks: na Triggers for Past Attempts: Unpredictable Intentional Self Injurious Behavior: None Family Suicide History: Yes (uncle shot himself after cancer dx) Recent stressful life event(s): Other (Comment) (homelessness & lack of job) Persecutory  voices/beliefs?: No Depression: Yes Depression Symptoms: Despondent;Feeling worthless/self pity;Loss of interest in usual pleasures Substance abuse history and/or treatment for substance abuse?: No Suicide prevention information given to non-admitted patients: Not applicable  Risk to Others Homicidal Ideation: No Thoughts of Harm to Others: No Current Homicidal Intent: No Current Homicidal Plan: No Access to Homicidal Means: No Identified Victim: na History of harm to others?: No Assessment of Violence: None Noted Violent Behavior Description: pt calm Does patient have access to weapons?: No Criminal Charges Pending?: No Does patient have a court date: No  Psychosis Hallucinations: None noted Delusions: None noted  Mental Status Report Appear/Hygiene: Disheveled;Other (Comment) (poor dental hygiene) Eye Contact: Good Motor Activity: Unremarkable Speech: Logical/coherent;Soft Level of Consciousness: Alert Mood: Depressed;Sad;Apathetic Affect: Appropriate to circumstance;Sad Anxiety Level: None Thought Processes: Coherent;Relevant Judgement: Unimpaired Orientation: Situation;Time;Place;Person Obsessive Compulsive Thoughts/Behaviors: None  Cognitive Functioning Concentration: Normal Memory: Recent Intact;Remote Intact IQ: Average Insight: Good Impulse Control: Fair Appetite: Good Weight Loss: 0  Weight Gain: 0  Sleep: No Change Total Hours of Sleep: 7  Vegetative Symptoms: None  ADLScreening [facility] Assessment Services) Patient's cognitive ability adequate to safely complete daily activities?: Yes Patient able to express need for assistance with ADLs?: Yes Independently performs ADLs?: Yes (appropriate for developmental age)  Abuse/Neglect [facility]) Physical Abuse: Denies Verbal Abuse: Denies Sexual Abuse: Denies  Prior Inpatient Therapy Prior Inpatient Therapy: Yes Prior Therapy Dates: 2011 & Nov 2012 [facility] Reason for  Treatment: Depression & suicide attempts  Prior Outpatient Therapy Prior Outpatient Therapy: Yes Prior Therapy Dates: na Prior Therapy Facilty/Provider(s): na Reason for Treatment: na  ADL Screening (condition at time of admission) Patient's cognitive ability adequate to safely complete daily activities?: Yes Patient able to express need for assistance with ADLs?: Yes Independently performs ADLs?: Yes (appropriate for developmental age) Weakness of Legs: None Weakness of Arms/Hands: None (no weakness but does st L hand is numb after "ministroke")  Home Assistive Devices/Equipment Home Assistive Devices/Equipment: None    Abuse/Neglect Assessment (Assessment to be complete while patient is alone) Physical Abuse: Denies Verbal Abuse: Denies Sexual Abuse: Denies Exploitation of patient/patient's resources: Denies Self-Neglect: Denies Values / Beliefs Cultural Requests During Hospitalization: None Spiritual Requests During Hospitalization: None   Advance Directives (For Healthcare) Advance Directive: Patient does not have advance directive;Patient would not like information    Additional Information 1:1 In Past 12 Months?: No CIRT Risk: No Elopement Risk: No Does patient have medical clearance?: Yes     Disposition:  Disposition Disposition of Patient: Inpatient treatment program;Outpatient treatment Type of inpatient treatment program: Adult Type of outpatient treatment: Adult (med management)  On Site Evaluation by:   Reviewed with Physician:     Donnamarie Rossetti P 06/17/2012 9:56 PM

## 2012-06-18 MED ORDER — CITALOPRAM HYDROBROMIDE 20 MG PO TABS: 20.0000 mg | ORAL_TABLET | Freq: Every day | ORAL | Status: DC

## 2012-06-18 NOTE — ED Notes (Signed)
telepsych in progress 

## 2012-06-18 NOTE — ED Notes (Signed)
Pt noted ambulating to the restroom.

## 2012-06-18 NOTE — ED Notes (Signed)
ACT in w/ pt 

## 2012-06-18 NOTE — ED Notes (Signed)
ACT team in with patient 

## 2012-06-18 NOTE — ED Notes (Signed)
Dr knapp into see 

## 2012-06-18 NOTE — ED Notes (Signed)
Pt dc'd w/ written dc instructions and rx x 1.  Pt encouraged to seek help for any return of suicidal thoughts/urges, and to contact the [place] tomorrow as instructed.  Pt verbalized understanding and reported that he would do so.   Bag of belongings returned, bus pas given to pt by CSW.

## 2012-06-18 NOTE — ED Notes (Signed)
On the phone 

## 2012-06-18 NOTE — ED Notes (Signed)
Up to the desk on the phone 

## 2012-06-18 NOTE — ED Notes (Signed)
Telepsych recommends pt to be stable for d/c. CSW will discuss shelter information and any other concerns pt may have.  Janann Colonel., MSW, [facility] Clinical Social Worker [phone]

## 2012-06-18 NOTE — ED Provider Notes (Addendum)
Pt resting this am without complaints.   Vital signs stable.   Filed Vitals:   06/18/12 0545  BP: 123/72  Pulse: 75  Temp: 97.8 F (36.6 C)  Resp: 16    Pending psych disposition.   Celene Kras, MD 06/18/12 [phone]  Pt assessed by tele psychiatry.  Stable for outpatient follow up.  Will add celexa 20 mg po daily as recommended.  Celene Kras, MD 06/18/12 1131

## 2012-06-18 NOTE — ED Notes (Signed)
CSW telephoned [facility] to determine if pt is eligible to seek shelter at that facility. Per staff member Doristine Counter, pt may come this afternoon if he chooses to. CSW informed pt of the option of residing at the shelter. Pt informed CSW that he desires to go to his daughter's house in [place] upon d/c oppose to the shelter. CSW provided pt's RN with bus pass for transportation assistance. No other concerns verbalized by pt at this time.    Janann Colonel., MSW, [facility] Clinical Social Worker [phone]

## 2012-06-18 NOTE — ED Provider Notes (Signed)
Medical screening examination/treatment/procedure(s) were performed by non-physician practitioner and as supervising physician I was immediately available for consultation/collaboration.   Carleene Cooper III, MD 06/18/12 1323

## 2012-07-23 IMAGING — CR DG CHEST 1V PORT
1 series · 1 of 1 positions shown · non-contrast
Comparison: Portable exam 9999 hours without priors for comparison.

CLINICAL DATA: Overdose

PORTABLE CHEST - 1 VIEW

[series [date]]
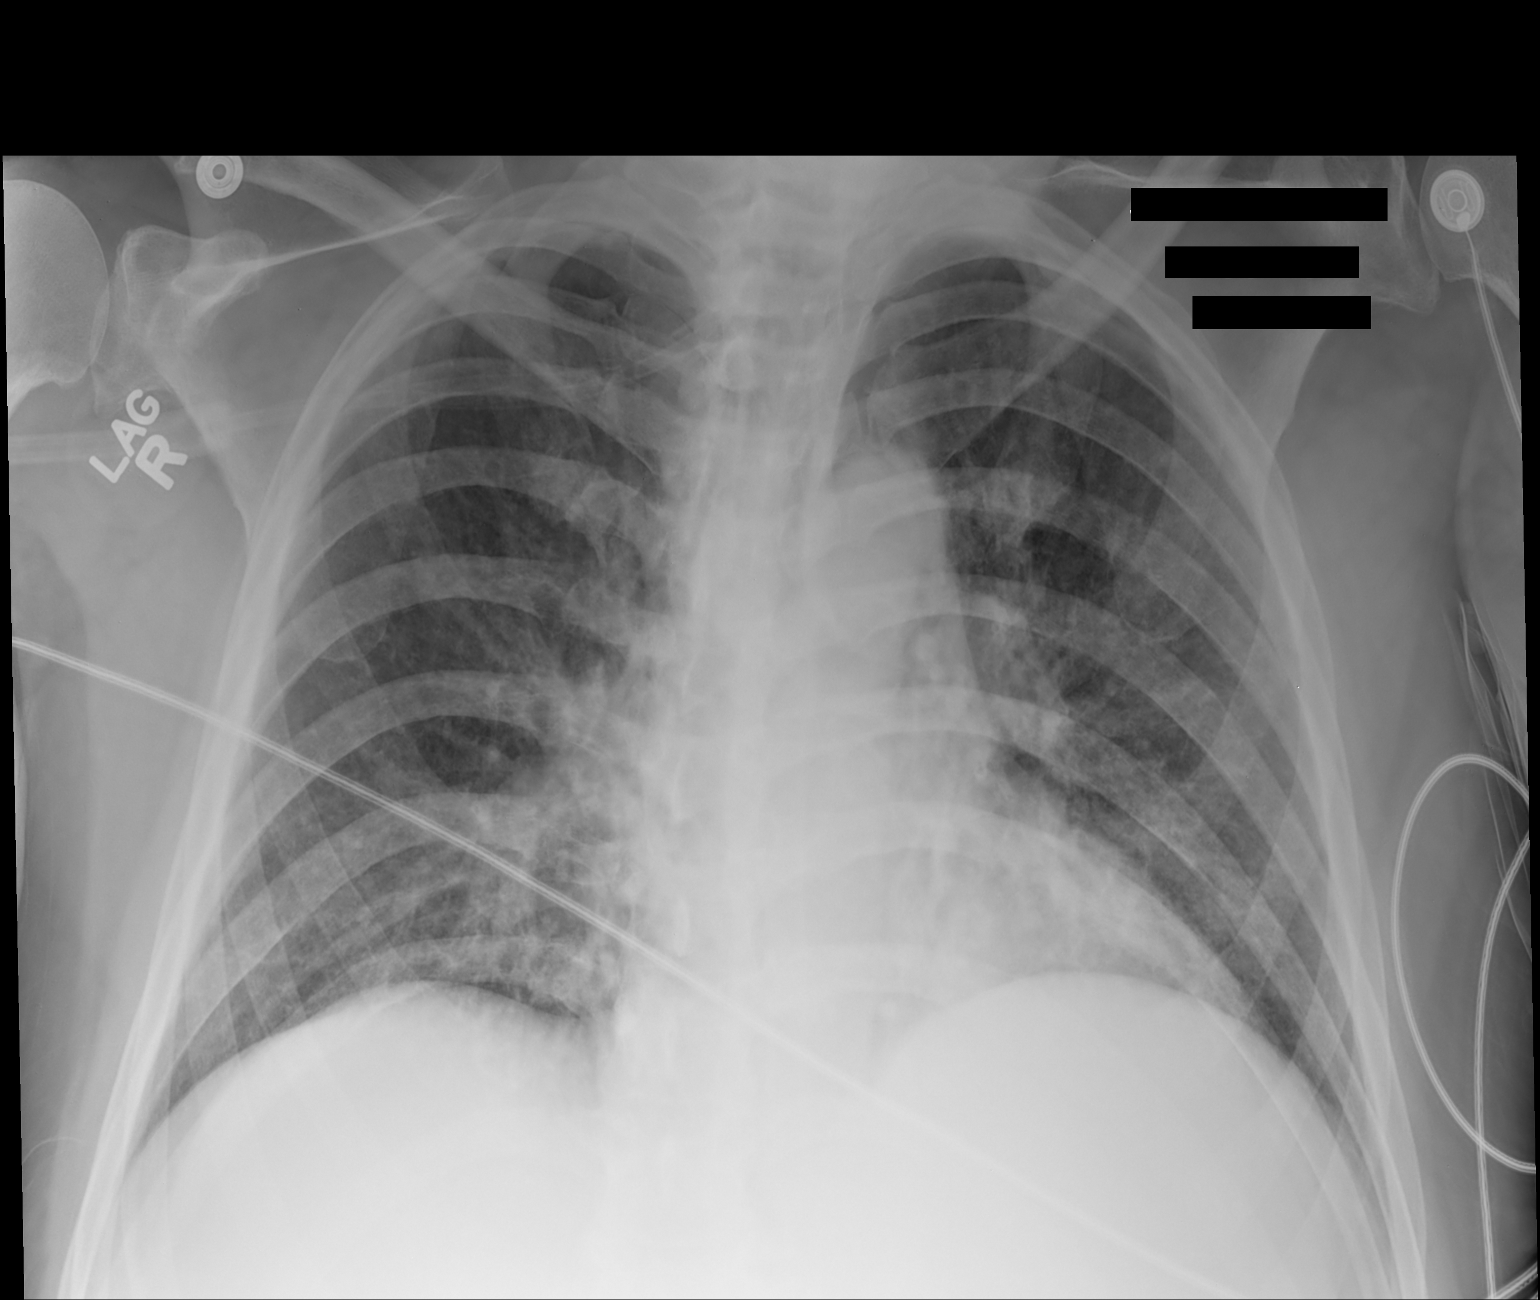

[1 of 1 positions shown; findings below may reference images not displayed]

FINDINGS: Normal heart size mediastinal contours.
Perihilar to basilar infiltrates, question infection versus
aspiration.
Upper lungs clear.
No pleural effusion or pneumothorax.
IMPRESSION: Perihilar to basilar infiltrates, which could represent infection
or aspiration.

## 2012-10-20 ENCOUNTER — Encounter (HOSPITAL_COMMUNITY): Payer: Self-pay

## 2012-10-20 ENCOUNTER — Emergency Department (HOSPITAL_COMMUNITY)
Admission: EM | Admit: 2012-10-20 | Discharge: 2012-10-21 | Disposition: A | Discharge status: Other Acute Inpatient Hospital | Payer: Medicare Other | Source: Home / Self Care | Attending: Emergency Medicine | Admitting: Emergency Medicine

## 2012-10-20 DIAGNOSIS — Z5900 Homelessness unspecified: Secondary | ICD-10-CM | POA: Insufficient documentation

## 2012-10-20 DIAGNOSIS — F17200 Nicotine dependence, unspecified, uncomplicated: Secondary | ICD-10-CM | POA: Insufficient documentation

## 2012-10-20 DIAGNOSIS — Z79899 Other long term (current) drug therapy: Secondary | ICD-10-CM | POA: Insufficient documentation

## 2012-10-20 DIAGNOSIS — R45851 Suicidal ideations: Secondary | ICD-10-CM

## 2012-10-20 DIAGNOSIS — E119 Type 2 diabetes mellitus without complications: Secondary | ICD-10-CM | POA: Insufficient documentation

## 2012-10-20 DIAGNOSIS — Z8679 Personal history of other diseases of the circulatory system: Secondary | ICD-10-CM | POA: Insufficient documentation

## 2012-10-20 DIAGNOSIS — Z87828 Personal history of other (healed) physical injury and trauma: Secondary | ICD-10-CM | POA: Insufficient documentation

## 2012-10-20 DIAGNOSIS — Z8673 Personal history of transient ischemic attack (TIA), and cerebral infarction without residual deficits: Secondary | ICD-10-CM | POA: Insufficient documentation

## 2012-10-20 DIAGNOSIS — Z915 Personal history of self-harm: Secondary | ICD-10-CM

## 2012-10-20 DIAGNOSIS — F3289 Other specified depressive episodes: Secondary | ICD-10-CM | POA: Insufficient documentation

## 2012-10-20 DIAGNOSIS — F329 Major depressive disorder, single episode, unspecified: Secondary | ICD-10-CM | POA: Insufficient documentation

## 2012-10-20 DIAGNOSIS — I10 Essential (primary) hypertension: Secondary | ICD-10-CM | POA: Insufficient documentation

## 2012-10-20 HISTORY — DX: Anesthesia of skin: R20.0

## 2012-10-20 LAB — CBC
HCT: 48.4 % (ref 39.0–52.0)
Hemoglobin: 17.3 g/dL — ABNORMAL HIGH (ref 13.0–17.0)
MCH: 32.4 pg (ref 26.0–34.0)
MCV: 90.6 fL (ref 78.0–100.0)
Platelets: 186 10*3/uL (ref 150–400)
RBC: 5.34 MIL/uL (ref 4.22–5.81)

## 2012-10-20 LAB — SALICYLATE LEVEL: Salicylate Lvl: <2 mg/dL — ABNORMAL LOW (ref 2.8–20.0)

## 2012-10-20 LAB — COMPREHENSIVE METABOLIC PANEL
BUN: 13 mg/dL (ref 6–23)
CO2: 23 mEq/L (ref 19–32)
Calcium: 8.8 mg/dL (ref 8.4–10.5)
Creatinine, Ser: 0.9 mg/dL (ref 0.50–1.35)
GFR calc Af Amer: >90 mL/min (ref >90)
GFR calc non Af Amer: 87 mL/min — ABNORMAL LOW (ref >90)
Glucose, Bld: 91 mg/dL (ref 70–99)
Total Bilirubin: 0.4 mg/dL (ref 0.3–1.2)

## 2012-10-20 LAB — ETHANOL: Alcohol, Ethyl (B): 165 mg/dL — ABNORMAL HIGH (ref 0–11)

## 2012-10-20 LAB — RAPID URINE DRUG SCREEN, HOSP PERFORMED: Opiates: NOT DETECTED

## 2012-10-20 MED ORDER — HYDROCHLOROTHIAZIDE 12.5 MG PO CAPS
12.5000 mg | ORAL_CAPSULE | Freq: Every day | ORAL | Status: DC
  Administered 2012-10-21: 12.5 mg via ORAL
  Filled 2012-10-20: qty 1

## 2012-10-20 MED ORDER — ZOLPIDEM TARTRATE 5 MG PO TABS: 5.0000 mg | ORAL_TABLET | Freq: Every evening | ORAL | Status: DC | PRN

## 2012-10-20 MED ORDER — LISINOPRIL-HYDROCHLOROTHIAZIDE 20-12.5 MG PO TABS: 1.0000 | ORAL_TABLET | Freq: Every day | ORAL | Status: DC

## 2012-10-20 MED ORDER — VITAMIN B-1 100 MG PO TABS
100.0000 mg | ORAL_TABLET | Freq: Every day | ORAL | Status: DC
  Administered 2012-10-21: 100 mg via ORAL
  Filled 2012-10-20: qty 1

## 2012-10-20 MED ORDER — ALUM & MAG HYDROXIDE-SIMETH 200-200-20 MG/5ML PO SUSP: 30.0000 mL | ORAL | Status: DC | PRN

## 2012-10-20 MED ORDER — ADULT MULTIVITAMIN W/MINERALS CH
1.0000 | ORAL_TABLET | Freq: Every day | ORAL | Status: DC
Start: 2012-10-21 — End: 2012-10-21
  Administered 2012-10-21: 1 via ORAL
  Filled 2012-10-20: qty 1

## 2012-10-20 MED ORDER — LORAZEPAM 1 MG PO TABS
1.0000 mg | ORAL_TABLET | Freq: Three times a day (TID) | ORAL | Status: DC | PRN
  Administered 2012-10-21: 1 mg via ORAL

## 2012-10-20 MED ORDER — LISINOPRIL 20 MG PO TABS
20.0000 mg | ORAL_TABLET | Freq: Every day | ORAL | Status: DC
  Administered 2012-10-21: 20 mg via ORAL
  Filled 2012-10-20: qty 1

## 2012-10-20 MED ORDER — LORAZEPAM 2 MG/ML IJ SOLN: 1.0000 mg | Freq: Four times a day (QID) | INTRAMUSCULAR | Status: DC | PRN

## 2012-10-20 MED ORDER — CITALOPRAM HYDROBROMIDE 20 MG PO TABS
20.0000 mg | ORAL_TABLET | Freq: Every day | ORAL | Status: DC
  Administered 2012-10-21: 20 mg via ORAL
  Filled 2012-10-20: qty 1

## 2012-10-20 MED ORDER — FOLIC ACID 1 MG PO TABS
1.0000 mg | ORAL_TABLET | Freq: Every day | ORAL | Status: DC
  Administered 2012-10-21: 1 mg via ORAL
  Filled 2012-10-20: qty 1

## 2012-10-20 MED ORDER — ALBUTEROL SULFATE HFA 108 (90 BASE) MCG/ACT IN AERS: 2.0000 | INHALATION_SPRAY | Freq: Four times a day (QID) | RESPIRATORY_TRACT | Status: DC | PRN

## 2012-10-20 MED ORDER — IBUPROFEN 600 MG PO TABS: 600.0000 mg | ORAL_TABLET | Freq: Three times a day (TID) | ORAL | Status: DC | PRN

## 2012-10-20 MED ORDER — THIAMINE HCL 100 MG/ML IJ SOLN: 100.0000 mg | Freq: Every day | INTRAMUSCULAR | Status: DC

## 2012-10-20 MED ORDER — ONDANSETRON HCL 4 MG PO TABS: 4.0000 mg | ORAL_TABLET | Freq: Three times a day (TID) | ORAL | Status: DC | PRN

## 2012-10-20 MED ORDER — NICOTINE 21 MG/24HR TD PT24
21.0000 mg | MEDICATED_PATCH | Freq: Every day | TRANSDERMAL | Status: DC
  Filled 2012-10-20: qty 1

## 2012-10-20 MED ORDER — LORAZEPAM 1 MG PO TABS: 1.0000 mg | ORAL_TABLET | Freq: Four times a day (QID) | ORAL | Status: DC | PRN

## 2012-10-20 MED ORDER — LORAZEPAM 1 MG PO TABS
1.0000 mg | ORAL_TABLET | Freq: Three times a day (TID) | ORAL | Status: DC
  Filled 2012-10-20: qty 1

## 2012-10-20 NOTE — ED Notes (Addendum)
Telepsych in process, tech at bedside.

## 2012-10-20 NOTE — ED Provider Notes (Signed)
History     CSN: [id number]  Arrival date & time 10/20/12  1540   First MD Initiated Contact with Patient 10/20/12 1634      Chief Complaint  Patient presents with  . Medical Clearance    (Consider location/radiation/quality/duration/timing/severity/associated sxs/prior treatment) HPI  Vincent [name] is a 67 y.o. male in no acute distress presenting with suicidal ideation with a plan. Patient has had 2 prior suicide attempts. One was overdose on narcotic pain control pills and a second was cutting his wrist. Today the patient plans to either cut his wrist or walk [place] traffic [place] [address]. Patient called the crisis line at the [place] who in turn called [facility] and brought him to the ED. Patient does not have access to any firearms. He is homeless at this time and staying in a motel. Patient is a Administrator, [employer]. He says he's been having issues with suicidal ideation for several years. He does not explain why he feels worse over the last several days. He denies any homicidal ideation, hallucinations, patient is a drinker he drinks daily 8-10 beers a day. He has had no prior history of DTs or seizure from alcohol withdrawal. He is also a smoker but he denies any illicit drug use. Patient denies any chest pain, shortness of breath, abdominal pain, nausea vomiting, fever or any other complaint. He does endorse a bilateral upper trauma he paresthesia which he has had for several years.   Past Medical History  Diagnosis Date  . Depression   . Suicide and self-inflicted injury Aug, 2012    cut wrist  . Diabetes mellitus   . Stroke   . Hypertension   . Numbness     Past Surgical History  Procedure Date  . Appendectomy   . Tonsillectomy   . Shoulder arthroscopy     No family history on file.  History  Substance Use Topics  . Smoking status: Current Every Day Smoker -- 1.0 packs/day    Types: Cigarettes  . Smokeless tobacco: Never Used  . Alcohol Use: 4.8 oz/week    8 Cans of beer per week    Comment: had 8 cans today.  states everyday is different      Review of Systems  Constitutional: Negative for fever.  Respiratory: Negative for shortness of breath.   Cardiovascular: Negative for chest pain.  Gastrointestinal: Negative for nausea, vomiting, abdominal pain and diarrhea.  Psychiatric/Behavioral: Positive for suicidal ideas. Negative for hallucinations, confusion and self-injury.  All other systems reviewed and are negative.    Allergies  Fluzone  Home Medications   Current Outpatient Rx  Name  Route  Sig  Dispense  Refill  . ALBUTEROL SULFATE HFA 108 (90 BASE) MCG/ACT IN AERS   Inhalation   Inhale 2 puffs into the lungs every 6 (six) hours as needed. Wheezing/shortness of breath.         Marland Kitchen LISINOPRIL-HYDROCHLOROTHIAZIDE 20-12.5 MG PO TABS   Oral   Take 1 tablet by mouth daily.           BP 142/90  Pulse 80  Temp 98 F (36.7 C) (Oral)  Resp 18  Ht 5\' 8"  (1.727 m)  Wt 172 lb (78.019 kg)  BMI 26.15 kg/m2  SpO2 100%  Physical Exam  Nursing note and vitals reviewed. Constitutional: He is oriented to person, place, and time. He appears well-developed and well-nourished. No distress.  HENT:  Head: Normocephalic.  Mouth/Throat: Oropharynx is clear and moist.  Eyes: Conjunctivae normal and  EOM are normal. Pupils are equal, round, and reactive to light.  Neck: Normal range of motion.  Cardiovascular: Normal rate, regular rhythm and intact distal pulses.   Pulmonary/Chest: Effort normal and breath sounds normal. No stridor. No respiratory distress. He has no wheezes. He has no rales. He exhibits no tenderness.  Abdominal: Soft. Bowel sounds are normal. He exhibits no distension and no mass. There is no tenderness. There is no rebound and no guarding.  Musculoskeletal: Normal range of motion.  Neurological: He is alert and oriented to person, place, and time.       Strength is 5 out of 5x4 extremities. Patient walks with a steady and coordinated gait.    Psychiatric: His speech is normal. Thought content normal. He is withdrawn. He exhibits a depressed mood.       Poor eye contact    ED Course  Procedures (including critical care time)  Labs Reviewed  CBC - Abnormal; Notable for the following:    WBC 11.9 (*)     Hemoglobin 17.3 (*)     All other components within normal limits  URINE RAPID DRUG SCREEN (HOSP PERFORMED)  ACETAMINOPHEN LEVEL  COMPREHENSIVE METABOLIC PANEL  ETHANOL  SALICYLATE LEVEL   No results found.   1. Suicidal ideation   2. Homelessness   3. History of suicide attempt       MDM  Patient with suicidal ideation and a plan with a prior attempt.  VSS, normal PE. Normal salicylate and acetaminophen level. Ethanol level is 165 patient is a mild leukocytosis of 11.9 CMP is not grossly abnormal.  Consulted Rita from ACT team, and ordered telepsych.  Pt is medically cleared.       Wynetta Emery, PA-C 10/20/12 [phone]

## 2012-10-20 NOTE — ED Notes (Signed)
Pt ambulatory to bathroom without assistance, no distress noted.  

## 2012-10-20 NOTE — ED Provider Notes (Signed)
Medical screening examination/treatment/procedure(s) were conducted as a shared visit with non-physician practitioner(s) and myself.  I personally evaluated the patient during the encounter.  Pt seen by Telepsych who recs admit, Pt still with SI in ED. 2340  Hurman Horn, MD 10/23/12 [phone]

## 2012-10-20 NOTE — ED Notes (Addendum)
[facility] b/c of suicidal ideation, "contemplating slitting wrists or going out on [address] and stepping in front of a truck."  States he has been here a few times for the same.  Denies being on any antidepressants.  States he's on Lisinopril and albuterol.  He is currently only out of albuterol.  Has an RX for antidepressant that he has to get filled at the [place] but hasn't been able to get there to get it.

## 2012-10-21 ENCOUNTER — Encounter (HOSPITAL_COMMUNITY): Payer: Self-pay

## 2012-10-21 ENCOUNTER — Inpatient Hospital Stay (HOSPITAL_COMMUNITY)
Admission: AD | Admit: 2012-10-21 | Discharge: 2012-10-26 | DRG: 897 | Disposition: A | Discharge status: Home / Self Care | Payer: Medicare Other | Source: Ambulatory Visit | Attending: Psychiatry | Admitting: Psychiatry

## 2012-10-21 DIAGNOSIS — I10 Essential (primary) hypertension: Secondary | ICD-10-CM | POA: Diagnosis present

## 2012-10-21 DIAGNOSIS — Z8673 Personal history of transient ischemic attack (TIA), and cerebral infarction without residual deficits: Secondary | ICD-10-CM

## 2012-10-21 DIAGNOSIS — Z79899 Other long term (current) drug therapy: Secondary | ICD-10-CM

## 2012-10-21 DIAGNOSIS — R209 Unspecified disturbances of skin sensation: Secondary | ICD-10-CM | POA: Diagnosis present

## 2012-10-21 DIAGNOSIS — E119 Type 2 diabetes mellitus without complications: Secondary | ICD-10-CM | POA: Diagnosis present

## 2012-10-21 DIAGNOSIS — R45851 Suicidal ideations: Secondary | ICD-10-CM

## 2012-10-21 DIAGNOSIS — F329 Major depressive disorder, single episode, unspecified: Secondary | ICD-10-CM

## 2012-10-21 DIAGNOSIS — R29898 Other symptoms and signs involving the musculoskeletal system: Secondary | ICD-10-CM | POA: Diagnosis present

## 2012-10-21 DIAGNOSIS — F1010 Alcohol abuse, uncomplicated: Secondary | ICD-10-CM

## 2012-10-21 DIAGNOSIS — F1020 Alcohol dependence, uncomplicated: Principal | ICD-10-CM | POA: Diagnosis present

## 2012-10-21 DIAGNOSIS — J45909 Unspecified asthma, uncomplicated: Secondary | ICD-10-CM | POA: Diagnosis present

## 2012-10-21 HISTORY — DX: Unspecified asthma, uncomplicated: J45.909

## 2012-10-21 LAB — GLUCOSE, CAPILLARY: Glucose-Capillary: 157 mg/dL — ABNORMAL HIGH (ref 70–99)

## 2012-10-21 MED ORDER — GABAPENTIN 100 MG PO CAPS
100.0000 mg | ORAL_CAPSULE | Freq: Three times a day (TID) | ORAL | Status: DC
  Administered 2012-10-21 – 2012-10-23 (×7): 100 mg via ORAL
  Filled 2012-10-21 (×12): qty 1
  Filled 2012-10-21: qty 12
  Filled 2012-10-21 (×6): qty 1
  Filled 2012-10-21 (×2): qty 12
  Filled 2012-10-21 (×4): qty 1

## 2012-10-21 MED ORDER — TRAZODONE HCL 50 MG PO TABS
50.0000 mg | ORAL_TABLET | Freq: Every evening | ORAL | Status: DC | PRN
  Filled 2012-10-21: qty 4

## 2012-10-21 MED ORDER — LISINOPRIL 20 MG PO TABS
20.0000 mg | ORAL_TABLET | Freq: Every day | ORAL | Status: DC
  Administered 2012-10-22 – 2012-10-26 (×5): 20 mg via ORAL
  Filled 2012-10-21 (×8): qty 1

## 2012-10-21 MED ORDER — HYDROCHLOROTHIAZIDE 12.5 MG PO CAPS
12.5000 mg | ORAL_CAPSULE | Freq: Every day | ORAL | Status: DC
  Administered 2012-10-22 – 2012-10-26 (×5): 12.5 mg via ORAL
  Filled 2012-10-21 (×8): qty 1

## 2012-10-21 MED ORDER — MAGNESIUM HYDROXIDE 400 MG/5ML PO SUSP: 30.0000 mL | Freq: Every day | ORAL | Status: DC | PRN

## 2012-10-21 MED ORDER — LISINOPRIL-HYDROCHLOROTHIAZIDE 20-12.5 MG PO TABS: 1.0000 | ORAL_TABLET | Freq: Every day | ORAL | Status: DC

## 2012-10-21 MED ORDER — ALBUTEROL SULFATE HFA 108 (90 BASE) MCG/ACT IN AERS
2.0000 | INHALATION_SPRAY | Freq: Four times a day (QID) | RESPIRATORY_TRACT | Status: DC | PRN
  Administered 2012-10-21 (×2): 2 via RESPIRATORY_TRACT
  Filled 2012-10-21: qty 6.7

## 2012-10-21 MED ORDER — ALUM & MAG HYDROXIDE-SIMETH 200-200-20 MG/5ML PO SUSP: 30.0000 mL | ORAL | Status: DC | PRN

## 2012-10-21 MED ORDER — ACETAMINOPHEN 325 MG PO TABS: 650.0000 mg | ORAL_TABLET | Freq: Four times a day (QID) | ORAL | Status: DC | PRN

## 2012-10-21 NOTE — BH Assessment (Signed)
Assessment Note   Vincent [name] is a 67 y.o. male brought in [employer], presents to [facility] with SI/Depression/SA.  Pt denies HI/Psych.  [facility], stating that he was thinking about cutting his wrists or walking in front a truck on the highway.  Pt is currently homeless.  Pt has recently been residing at [facility] until 10/20/12--"This was my last day".  Pt has tried to harm self 2x's in the past by overdosing on 40 Vicodin tabs and slitting wrists.  The triggers for current episode: (1) Homeless since 2012, (2) Financial Issues, (3) Poor Health( pt is diabetic and takes no meds, says is experiencing neuropathy in bilateral arms/hands) and (4) Being Alone.  Pt is also abusing alcohol, consuming 7-12 beers daily, last drink was 10/20/12, no current w/d sxs reported.  Pt has had no prior detox/rehab treatment for alcohol problem. Pt endorses anhedonia, poor sleep, isolation, flat affect, tearfulness and hopeless--"I can't see anything positive ahead, I'm just getting old".      Axis I: Alcohol Abuse and Major Depression, Recurrent severe Axis II: Deferred Axis III:  Past Medical History  Diagnosis Date  . Depression   . Suicide and self-inflicted injury Aug, 2012    cut wrist  . Diabetes mellitus   . Stroke   . Hypertension   . Numbness    Axis IV: economic problems, housing problems, other psychosocial or environmental problems, problems related to social environment and problems with primary support group Axis V: 31-40 impairment in reality testing  Past Medical History:  Past Medical History  Diagnosis Date  . Depression   . Suicide and self-inflicted injury Aug, 2012    cut wrist  . Diabetes mellitus   . Stroke   . Hypertension   . Numbness     Past Surgical History  Procedure Date  . Appendectomy   . Tonsillectomy   . Shoulder arthroscopy     Family History: History reviewed. No pertinent family history.  Social History:  reports that he has  been smoking Cigarettes.  He has been smoking about 1 pack per day. He has never used smokeless tobacco. He reports that he drinks about 4.8 ounces of alcohol per week. He reports that he does not use illicit drugs.  Additional Social History:  Alcohol / Drug Use Pain Medications: See MAR  Prescriptions: See MAR  Over the Counter: See MAR  History of alcohol / drug use?: Yes Longest period of sobriety (when/how long): None  Negative Consequences of Use: Financial;Personal relationships Withdrawal Symptoms: Other (Comment) (No current w/d sxs) Substance #1 Name of Substance 1: Alcohol--Beer 1 - Age of First Use: Teens  1 - Amount (size/oz): 7-12 beers  1 - Frequency: Daily  1 - Duration: On-going  1 - Last Use / Amount: 10/20/12  CIWA: CIWA-Ar BP: 158/86 mmHg Pulse Rate: 77  Nausea and Vomiting: no nausea and no vomiting Tactile Disturbances: none Tremor: no tremor Auditory Disturbances: not present Paroxysmal Sweats: no sweat visible Visual Disturbances: not present Anxiety: no anxiety, at ease Headache, Fullness in Head: none present Agitation: normal activity Orientation and Clouding of Sensorium: oriented and can do serial additions CIWA-Ar Total: 0  COWS:    Allergies:  Allergies  Allergen Reactions  . Fluzone (Flu Virus Vaccine) Other (See Comments)    Home Medications:  (Not in a hospital admission)  OB/GYN Status:  No LMP for male patient.  General Assessment Data [facility] of [facility]: [facility] Living  Arrangements: Other (Comment) (Homeless ) Can pt return to current living arrangement?: Yes Admission Status: Voluntary Is patient capable of signing voluntary admission?: Yes Transfer from: Acute Hospital Referral Source: MD  Education Status Is patient currently in school?: No Current Grade: None  Highest grade of school patient has completed: None  Name of school: None  Contact person: None   Risk to self Suicidal Ideation: Yes-Currently  Present Suicidal Intent: Yes-Currently Present Is patient at risk for suicide?: No Suicidal Plan?: Yes-Currently Present Specify Current Suicidal Plan: Stab Self or Walk in front of Truck  Access to Means: Yes Specify Access to Suicidal Means: Sharps, knives, vehicles  What has been your use of drugs/alcohol within the last 12 months?: Abusing: Alcohol  Previous Attempts/Gestures: Yes How many times?: 2  Other Self Harm Risks: None  Triggers for Past Attempts: Unpredictable Intentional Self Injurious Behavior: None Family Suicide History: No Recent stressful life event(s): Other (Comment) (Homeless, Being Alone, Health Problems) Persecutory voices/beliefs?: No Depression: Yes Depression Symptoms: Despondent;Isolating;Fatigue;Loss of interest in usual pleasures;Feeling worthless/self pity;Feeling angry/irritable Substance abuse history and/or treatment for substance abuse?: Yes Suicide prevention information given to non-admitted patients: Not applicable  Risk to Others Homicidal Ideation: No Thoughts of Harm to Others: No Current Homicidal Intent: No Current Homicidal Plan: No Access to Homicidal Means: No Identified Victim: None  History of harm to others?: No Assessment of Violence: None Noted Violent Behavior Description: None  Does patient have access to weapons?: No Criminal Charges Pending?: No Does patient have a court date: No  Psychosis Hallucinations: None noted Delusions: None noted  Mental Status Report Appear/Hygiene: Disheveled;Poor hygiene Eye Contact: Poor Motor Activity: Unremarkable Speech: Logical/coherent Level of Consciousness: Alert Mood: Depressed;Anhedonia;Despair;Sad Affect: Depressed;Sad Anxiety Level: None Thought Processes: Coherent;Relevant Judgement: Impaired Orientation: Person;Place;Time;Situation Obsessive Compulsive Thoughts/Behaviors: None  Cognitive Functioning Concentration: Normal Memory: Recent Intact;Remote Intact IQ:  Average Insight: Poor Impulse Control: Poor Appetite: Fair Weight Loss: 0  Weight Gain: 0  Sleep: Decreased Total Hours of Sleep: 3  Vegetative Symptoms: None  ADLScreening [facility] Assessment Services) Patient's cognitive ability adequate to safely complete daily activities?: Yes Patient able to express need for assistance with ADLs?: Yes Independently performs ADLs?: Yes (appropriate for developmental age)  Abuse/Neglect [facility]) Physical Abuse: Denies Verbal Abuse: Denies Sexual Abuse: Denies  Prior Inpatient Therapy Prior Inpatient Therapy: Yes Prior Therapy Dates: 2011, 2012 Prior Therapy Facilty/Provider([facility]  Reason for Treatment: Depression/SI   Prior Outpatient Therapy Prior Outpatient Therapy: No Prior Therapy Dates: None  Prior Therapy Facilty/Provider(s): None  Reason for Treatment: None   ADL Screening (condition at time of admission) Patient's cognitive ability adequate to safely complete daily activities?: Yes Patient able to express need for assistance with ADLs?: Yes Independently performs ADLs?: Yes (appropriate for developmental age) Weakness of Legs: None Weakness of Arms/Hands: None  Home Assistive Devices/Equipment Home Assistive Devices/Equipment: None  Therapy Consults (therapy consults require a physician order) PT Evaluation Needed: No OT Evalulation Needed: No SLP Evaluation Needed: No Abuse/Neglect Assessment (Assessment to be complete while patient is alone) Physical Abuse: Denies Verbal Abuse: Denies Sexual Abuse: Denies Exploitation of patient/patient's resources: Denies Self-Neglect: Denies Values / Beliefs Cultural Requests During Hospitalization: None Spiritual Requests During Hospitalization: None Consults Spiritual Care Consult Needed: No Social Work Consult Needed: No Merchant navy officer (For Healthcare) Advance Directive: Patient does not have advance directive;Patient would not like information Pre-existing  out of facility DNR order (yellow form or pink MOST form): No Nutrition Screen- [facility] Patient's home diet: Regular  Have you recently lost weight without trying?: No Have you been eating poorly because of a decreased appetite?: No Malnutrition Screening Tool Score: 0   Additional Information 1:1 In Past 12 Months?: No CIRT Risk: No Elopement Risk: No Does patient have medical clearance?: Yes     Disposition:  Disposition Disposition of Patient: Inpatient treatment program;Referred to [facility] ) Type of inpatient treatment program: Adult Patient referred to: Other (Comment) [facility] )  On Site Evaluation by:   Reviewed with Physician:     Murrell Redden 10/21/2012 2:34 AM

## 2012-10-21 NOTE — H&P (Signed)
Psychiatric Admission Assessment Adult  Patient Identification:  Vincent [name] Date of Evaluation:  10/21/2012 Chief Complaint:  Major Depressive Disorder Recurrent Severe Alcohol Abuse History of Present Illness:: Vincent [name] is a 67 year old white male who was taken to the emergency department by the police department after he called the [facility] crisis line trying to get help for his numbness in his extremities. He reports that this has been a problem for approximately 2 years, and he can't seem to get the [place] to help him. While on the crisis line he reported that he had suicidal thoughts of cutting himself or walking out in front of a truck on the highway. He also reports that he drinks frequently, and states that he drinks typically 4-5 beers daily.  Vincent [name] has been hospitalized in this facility in August of 2013 after having cut his wrists with a knife in a suicidal gesture. These wounds required sutures. He has one prior suicide attempt by overdosing on opiate pain medication.  Vincent [name] cites multiple psychosocial stressors including homelessness, financial difficulties, and poor health. He is reluctant to accept any medication for depression, but he is willing to take medication that will help alleviate the numbness in his extremities. He denies any withdrawal symptoms or cravings for alcohol.  Elements:  Location:  [facility] adult inpatient unit. Quality:  Affects patient's ability to function. Severity:  Drives patient to thoughts of suicide. Timing:  Chronic. Duration:  2 years. Context:  Affects all areas of patient's life. Associated Signs/Synptoms: Depression Symptoms:  depressed mood, insomnia, hopelessness, suicidal thoughts with specific plan, suicidal attempt, (Hypo) Manic Symptoms:  Irritable Mood, Anxiety Symptoms:  Excessive Worry, Psychotic Symptoms:  None PTSD Symptoms: NA  Psychiatric Specialty Exam: Physical Exam  Nursing note and vitals  reviewed.  I have met face-to-face with this patient, and have reviewed the medical history and physical exam performed by Wynetta Emery in the emergency department at [facility] on 10/20/12 at 1804, and I agree with the findings of that exam.   Review of Systems  Constitutional: Negative.   HENT: Positive for neck pain. Negative for hearing loss, ear pain, congestion, sore throat and tinnitus.   Eyes: Negative for blurred vision, double vision and photophobia.  Respiratory: Negative.   Cardiovascular: Negative.   Gastrointestinal: Negative.   Genitourinary: Negative.   Musculoskeletal: Positive for back pain. Negative for myalgias. Joint pain: Knees and shoulders.  Skin: Positive for rash.  Neurological: Negative for dizziness, tingling, tremors, seizures, loss of consciousness and headaches.  Endo/Heme/Allergies: Positive for environmental allergies (Pollen, dags, cats). Does not bruise/bleed easily.  Psychiatric/Behavioral: Positive for depression, suicidal ideas and substance abuse. Negative for hallucinations and memory loss. The patient is nervous/anxious and has insomnia.     Blood pressure 138/85, pulse 90, temperature 98.1 F (36.7 C), temperature source Oral, resp. rate 16, height 5\' 8"  (1.727 m), weight 76.204 kg (168 lb).Body mass index is 25.54 kg/(m^2).  General Appearance: Disheveled  Eye Solicitor::  Fair  Speech:  Clear and Coherent  Volume:  Normal  Mood:  Dysphoric and Irritable  Affect:  Congruent  Thought Process:  Circumstantial and Tangential  Orientation:  Full (Time, Place, and Person)  Thought Content:  Obsessions  Suicidal Thoughts:  Yes.  without intent/plan  Homicidal Thoughts:  No  Memory:  Immediate;   Fair Recent;   Fair Remote;   Fair  Judgement:  Impaired  Insight:  Lacking  Psychomotor Activity:  Normal  Concentration:  Fair  Recall:  Good  Akathisia:  No  Handed:  Right  AIMS (if indicated):     Assets:  Desire for Improvement    Sleep:       Past Psychiatric History: Diagnosis:  Hospitalizations:  Outpatient Care:  Substance Abuse Care:  Self-Mutilation:  Suicidal Attempts:  Violent Behaviors:   Past Medical History:   Past Medical History  Diagnosis Date  . Depression   . Suicide and self-inflicted injury Aug, 2012    cut wrist  . Diabetes mellitus   . Stroke   . Hypertension   . Numbness    None. Allergies:   Allergies  Allergen Reactions  . Fluzone (Flu Virus Vaccine) Other (See Comments)   PTA Medications: Prescriptions prior to admission  Medication Sig Dispense Refill  . albuterol (PROVENTIL HFA;VENTOLIN HFA) 108 (90 BASE) MCG/ACT inhaler Inhale 2 puffs into the lungs every 6 (six) hours as needed. Wheezing/shortness of breath.      . lisinopril-hydrochlorothiazide (PRINZIDE,ZESTORETIC) 20-12.5 MG per tablet Take 1 tablet by mouth daily.        Previous Psychotropic Medications:  Medication/Dose                 Substance Abuse History in the last 12 months:  yes  Consequences of Substance Abuse: Medical Consequences:    Social History:  reports that he has been smoking Cigarettes.  He has a 50 pack-year smoking history. He has never used smokeless tobacco. He reports that he drinks about 4.8 ounces of alcohol per week. He reports that he does not use illicit drugs. Additional Social History: History of alcohol / drug use?: No history of alcohol / drug abuse Withdrawal Symptoms: Other (Comment) (none)                    Current Place of Residence:  Homeless, has been living in hotels  Family History:  History reviewed. No pertinent family history.  Results for orders placed during the hospital encounter of 10/21/12 (from the past 72 hour(s))  GLUCOSE, CAPILLARY     Status: Abnormal   Collection Time   10/21/12 11:12 AM      Component Value Range Comment   Glucose-Capillary 157 (*) 70 - 99 mg/dL    Psychological Evaluations:  Assessment:   AXIS I:  Alcohol  Abuse and Depressive Disorder secondary to general medical condition AXIS II:  Deferred AXIS III:   Past Medical History  Diagnosis Date  . Depression   . Suicide and self-inflicted injury Aug, 2012    cut wrist  . Diabetes mellitus   . Stroke   . Hypertension   . Numbness    AXIS IV:  economic problems, housing problems, occupational problems and problems with access to health care services AXIS V:  11-20 some danger of hurting self or others possible OR occasionally fails to maintain minimal personal hygiene OR gross impairment in communication  Treatment Plan/Recommendations:  We will admit Vincent [name] for purposes of safety and stabilization. He will attend group therapy sessions to gain insight and build healthy coping mechanisms. He refuses offers for antidepressant medication, but we will start him on Neurontin for his peripheral neuropathies. We will attempt to assist him and his receiving 8 from the [facility] for his medical problems.  Treatment Plan Summary: Daily contact with patient to assess and evaluate symptoms and progress in treatment Medication management Refer for proper followup care Current Medications:  Current Facility-Administered Medications  Medication Dose Route Frequency Provider Last Rate Last Dose  .  albuterol (PROVENTIL HFA;VENTOLIN HFA) 108 (90 BASE) MCG/ACT inhaler 2 puff  2 puff Inhalation Q6H PRN Jorje Guild, PA-C      . lisinopril (PRINIVIL,ZESTRIL) tablet 20 mg  20 mg Oral Daily Rachael Fee, MD       And  . hydrochlorothiazide (MICROZIDE) capsule 12.5 mg  12.5 mg Oral Daily Rachael Fee, MD        Observation Level/Precautions:  15 minute checks  Laboratory:  Per emergency department  Psychotherapy:  Attend groups   Medications:  Neurontin, began with 100 mg 3 times daily   Consultations:    Discharge Concerns:  History of suicide attempts   Estimated LOS: 5-10 days   Other:     I certify that inpatient services furnished can  reasonably be expected to improve the patient's condition.   Vincent [name] 1/25/20141:15 PM

## 2012-10-21 NOTE — ED Provider Notes (Addendum)
  Physical Exam  BP 149/83  Pulse 69  Temp 98.2 F (36.8 C) (Oral)  Resp 16  Ht 5\' 8"  (1.727 m)  Wt 172 lb (78.019 kg)  BMI 26.15 kg/m2  SpO2 97%  Physical Exam  ED Course  Procedures  MDM This morning the patient is awake, alert, continuing to request inpatient management. He states that his depression is slightly improved this morning, the patient has been accepted at behavioral health pending paperwork to be finished. Accepting doctor was Dr. Dub Mikes.  He does not appear to be responding to internal stimuli, has a clear sensorium and is answering questions appropriately. There is no respiratory distress, clear conjunctiva, no tremors or signs of substance withdrawal.  Transfer paperwork completed - pt to [facility]      Vida Roller, MD 10/21/12 1610  Vida Roller, MD 10/21/12 [phone]

## 2012-10-21 NOTE — BHH Counselor (Signed)
Per [place] @ [facility], pt accepted for treatment Verne Spurr, PA to Geoffery Lyons, MD. Patients room # is 305-2. Writer notified EDP (Dr. Jeraldine Loots) of patients disposition. Also will notify patients nurse. The call report # is [phone]. Patient is currently voluntary and will be transported via security to [facility] for inpatient treatment. All support paperwork completed and faxed to [facility].

## 2012-10-21 NOTE — Progress Notes (Signed)
BHH Group Notes:  (Nursing/MHT/Case Management/Adjunct)  Date:  10/21/2012  Time:  7:05 PM  Type of Therapy:  Therapeutic Activity  Participation Level:  Did Not Attend  Dalia Heading 10/21/2012, 7:05 PM

## 2012-10-21 NOTE — BHH Counselor (Signed)
Patient has been accepted at [facility] by Verne Spurr PA to the services of Dr. Dub Mikes

## 2012-10-21 NOTE — Progress Notes (Addendum)
Patient ID: Vincent [name], male   DOB: 1946/02/10, 67 y.o.   MRN: [id number]\ D)  Admission Note: This is a 67 year old male who is admitted voluntarily to the service of Dr. Dub Mikes. Pt states that he has been living in a [employer] for awhile now and states  he has been living in motels for several years. Pt goes to the [place] for medical treatment. Pt states he has been having difficulty for two year with his left arm. States that it goes numb. [facility] (according to the Pt) that he had had a stoke. Pt is a poor historian. States he is also a diabetic but has not been put on anything for this.  CBG on admission 157. Pat history of 2 suicide attempts in the past.   Denies SI and HI. Denies pain. States "[facility] and told them I had had it. I have had this problem with my arms for awhile. I told them that I was depressed because of it and I wanted something done about it. They must have called the police. At the hospital they asked me if I was suicidal or if I have every thought of it and I told them yes. All I want is someone to look at my arms.   Pt also has a history of drinking beer "I never drink hard liquor". Drinks frequently but denies a pattern of X number per day. Can drink up to "6 or not at all". Denies any trouble with detox.   Pt came to [place] with a rash on his lower body. There is no pattern to the rash. States that it itches at times. Shavone NP came and looked at the rash.  A) Pt given support, reassurance and praise. Oriented to the unit and to the rules. Given encouragement to work the program while he is here.  R) Denies SI and HI. Frustrated that he is here and not at the [place].

## 2012-10-21 NOTE — ED Notes (Signed)
Report received-no s/s's of distress-will continue to monitor

## 2012-10-21 NOTE — BHH Suicide Risk Assessment (Signed)
Suicide Risk Assessment  Admission Assessment     Demographic factors:  Assessment Details Time of Assessment: Admission Information Obtained From: Patient Current Mental Status:  Current Mental Status: NA Loss Factors:  Loss Factors: Decline in physical health Historical Factors:  Historical Factors: Prior suicide attempts Risk Reduction Factors:  Risk Reduction Factors: NA  CLINICAL FACTORS:   Alcohol/Substance Abuse/Dependencies Medical Diagnoses and Treatments/Surgeries  COGNITIVE FEATURES THAT CONTRIBUTE TO RISK:  Closed-mindedness    SUICIDE RISK:   Minimal: No identifiable suicidal ideation.  Patients presenting with no risk factors but with morbid ruminations; may be classified as minimal risk based on the severity of the depressive symptoms Has made one attempt 2012 due to arm numbness and inability to function; cut his wrist PLAN OF CARE: He has tried to get his arm numbness and weakness - now involving his neck- at the [facility].  He was referred tp [place] [facility].   He drinks on ave. 4-5 can. 12 oz. Beer. He drinks his evening.  He may not drink every day.Marland Kitchen  He has not gone to AA.  He has no interest om going to AA.  He has been drinking since teenager.  He has never lost a job due to drinking.  He denies suicidal thoughts.    He will participate in group discussion.  He will report any suicidal thoughts or plans to staff, especially two days before discharge.   Yanai Hobson 10/21/2012, 3:33 PM

## 2012-10-22 DIAGNOSIS — F1994 Other psychoactive substance use, unspecified with psychoactive substance-induced mood disorder: Secondary | ICD-10-CM

## 2012-10-22 MED ORDER — ONDANSETRON 4 MG PO TBDP: 4.0000 mg | ORAL_TABLET | Freq: Four times a day (QID) | ORAL | Status: AC | PRN

## 2012-10-22 MED ORDER — HYDROXYZINE HCL 25 MG PO TABS: 25.0000 mg | ORAL_TABLET | Freq: Four times a day (QID) | ORAL | Status: AC | PRN

## 2012-10-22 MED ORDER — ADULT MULTIVITAMIN W/MINERALS CH
1.0000 | ORAL_TABLET | Freq: Every day | ORAL | Status: DC
  Administered 2012-10-22 – 2012-10-26 (×5): 1 via ORAL
  Filled 2012-10-22 (×7): qty 1

## 2012-10-22 MED ORDER — CHLORDIAZEPOXIDE HCL 25 MG PO CAPS: 50.0000 mg | ORAL_CAPSULE | Freq: Once | ORAL | Status: DC

## 2012-10-22 MED ORDER — LOPERAMIDE HCL 2 MG PO CAPS: 2.0000 mg | ORAL_CAPSULE | ORAL | Status: AC | PRN

## 2012-10-22 MED ORDER — THIAMINE HCL 100 MG/ML IJ SOLN: 100.0000 mg | Freq: Once | INTRAMUSCULAR | Status: DC

## 2012-10-22 MED ORDER — VITAMIN B-1 100 MG PO TABS
100.0000 mg | ORAL_TABLET | Freq: Every day | ORAL | Status: DC
  Administered 2012-10-23 – 2012-10-26 (×4): 100 mg via ORAL
  Filled 2012-10-22 (×6): qty 1

## 2012-10-22 MED ORDER — CHLORDIAZEPOXIDE HCL 25 MG PO CAPS: 25.0000 mg | ORAL_CAPSULE | Freq: Four times a day (QID) | ORAL | Status: AC | PRN
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
## 2012-10-22 NOTE — Clinical Social Work Psychosocial (Signed)
[facility] Group Notes:  (Clinical Social Work)  10/22/2012   10-11AM  Summary of Progress/Problems:  The main focus of today's process group was to listen to a variety of genres of music and to identify that different types of music provoke different responses.  The patient then was able to identify personally what was soothing for them, as well as energizing.  Examples were given of how to use this knowledge in sleep habits, with depression, and with other symptoms.  The patient expressed good understanding of the purpose of the group.  He stated that music does not really invoke emotion in him, but rather he either "likes it" or "does not like it."  Type of Therapy:  Music Therapy with processing done  Participation Level:  Active  Participation Quality:  Attentive and Sharing  Affect:  Blunted  Cognitive:  Appropriate and Oriented  Insight:  Engaged  Engagement in Therapy:  Engaged  Modes of Intervention:   Socialization, Teacher, English as a foreign language, Exploration, Education, Rapport Building   [employer], LCSW 10/22/2012, 12:22 PM

## 2012-10-22 NOTE — Progress Notes (Signed)
D) Pt rates his depression and hopelessness at a 1. Denies SI and HI. States that he is feeling fine. Doesn't think that the neurontin  is helping him at all. Wants to know "when is it going to start working". Continues to feel that it is his arms that are feeling numb that are causing the problem for him.  A) Given support and reassurance along with praise. [facility] to [facility]. R) Denies SI and HI.

## 2012-10-22 NOTE — Progress Notes (Signed)
Adult Psychoeducational Group Note  Date:  10/22/2012 Time:  6:56 PM  Group Topic/Focus:  Different perspectives group   Participation Level:  Active  Participation Quality:  Appropriate, Attentive, Resistant and Supportive  Affect:  Appropriate and Flat  Cognitive:  Alert and Appropriate  Insight: Appropriate and Good  Engagement in Group:  Resistant  Modes of Intervention:  Discussion, Education and Problem-solving  Additional Comments:  Pt was resistant to participating in group activity/discussion in the beginning of group but became more involved by the end, as he showed good insight into the different perspectives group.    Dalia Heading 10/22/2012, 6:56 PM

## 2012-10-22 NOTE — Progress Notes (Signed)
Patient ID: Vincent [name], male   DOB: September 24, 1946, 67 y.o.   MRN: [id number] D)  Didn't attend AA group this evening,  Was taking a nap.  When asked about not attending, he stated he didn't have a drinking problem, and didn't need to go.  Stated he had been trying to get seen at the [place] for numbness in his arms, and has an appt 10/31/12 with a neurologist, and had admitted he had had some beer.  States the next thing he knew, he was here for detox.  States feels some anxiety at times, but feels he's doing OK. A)  Will continue to monitor for safety R)  Safety maintained

## 2012-10-22 NOTE — Progress Notes (Signed)
Adult Psychoeducational Group Note  Date:  10/22/2012 Time:  8:51 PM  Group Topic/Focus: AA Meeting  Participation Level:  Active  Additional Comments:  Patient did attend meeting tonight.  Tiaria Biby, Newton Pigg 10/22/2012, 8:51 PM
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
## 2012-10-23 ENCOUNTER — Encounter (HOSPITAL_COMMUNITY): Payer: Self-pay | Admitting: Psychiatry

## 2012-10-23 DIAGNOSIS — F329 Major depressive disorder, single episode, unspecified: Secondary | ICD-10-CM

## 2012-10-23 NOTE — Progress Notes (Signed)
[facility] LCSW Group Therapy  10/23/2012 2:26 PM  Type of Therapy:  Group Therapy  Participation Level:  Minimal  Participation Quality:  Attentive, Resistant and Sharing  Affect:  Resistant  Cognitive:  Alert  Insight:  Distracting  Engagement in Therapy:  Limited and Resistant  Modes of Intervention:  Activity and Discussion  Summary of Progress/Problems: He did not participate in relaxation activities but sat quietly while others participated.  Shared his thoughts on some of the skills but did not seem to buy into the ideas.  Olivia Mackie 10/23/2012, 2:26 PM

## 2012-10-23 NOTE — Progress Notes (Signed)
[facility] [place] LCSW Aftercare Discharge Planning Group Note  10/23/2012 5:13 PM  Participation Quality:  Attentive and Sharing  Affect:  Flat  Cognitive:  Alert and Oriented  Insight:  Limited  Engagement in Group:  Limited  Modes of Intervention:  Exploration, Rapport Building and Support  Summary of Progress/Problems:  Patient reports that he does not need to be here. "[facility], I am so frustrated trying to get my surgery.  [facility] ever [facility] and I said yes and this is where I end up."  Patient reports no desire for substance abuse or mental health treatment.   [name] 10/23/2012, 5:13 PM

## 2012-10-23 NOTE — Progress Notes (Signed)
Adult Psychoeducational Group Note  Date:  10/23/2012 Time:  10:33 PM  Group Topic/Focus:  Wrap-Up Group:   The focus of this group is to help patients review their daily goal of treatment and discuss progress on daily workbooks.  Participation Level:  Active  Participation Quality:  Appropriate, Sharing and Supportive  Affect:  Appropriate  Cognitive:  Appropriate  Insight: Appropriate  Engagement in Group:  Supportive  Modes of Intervention:  Support  Additional Comments:  Pt attended AA tonight.  Isla Pence M 10/23/2012, 10:33 PM

## 2012-10-23 NOTE — Progress Notes (Signed)
Patient ID: Vincent [name], male   DOB: [birth year], 67 y.o.   MRN: [id number] D)  Has been out on hall and in dayroom, attended group tonight.  Denies having w/d sx,  No hs meds given tonight.   A)  Will continue to monitor for safety, continue POC. R)  Safety maintained.

## 2012-10-23 NOTE — Progress Notes (Signed)
Patient ID: Vincent [name], male   DOB: [birth year], 67 y.o.   MRN: [id number] PER STATE REGULATIONS 482.30  THIS CHART WAS REVIEWED FOR MEDICAL NECESSITY WITH RESPECT TO THE PATIENT'S ADMISSION/ DURATION OF STAY.  NEXT REVIEW DATE: 10/24/2012 Willa Rough, RN, BSN CASE MANAGER

## 2012-10-23 NOTE — Progress Notes (Signed)
[facility] MD Progress Note  10/23/2012 10:51 AM Vincent [name]  MRN:  [id number] Subjective:  Was frustrated because of the pain, the numbness, inability to do the things he used to do before.. Got depressed, did say he has had thoughts about hurting himself. Does drink but minimizes his use. (Had a suicidal gesture after he had "the stroke." ) Waiting to get the [place] to help him. The symptoms are getting worst.  Lives by himself. Has four kids and a grand kid.( 3 ex wives) Designer, fashion/clothing.  Diagnosis:  Major Depression, Anxiety Disorder NO, Alcohol Abuse R/O Dependence  ADL's:  Intact  Sleep: four hours straight, usual  Appetite:  Fair  Suicidal Ideation:  Plan:  Denies Intent:  Denies Means:  Denies Homicidal Ideation:  Plan:  Denies Intent:  Denies Means:  Denies AEB (as evidenced by):  Psychiatric Specialty Exam: Review of Systems  Constitutional: Negative.   HENT: Positive for neck pain.   Eyes: Negative.   Respiratory: Positive for shortness of breath and wheezing.        One pack a day, (50 years)  Cardiovascular: Negative.   Gastrointestinal: Negative.   Genitourinary: Negative.   Musculoskeletal: Positive for myalgias.  Skin: Negative.   Neurological: Positive for sensory change.       Both hands numb, pain left shoulder, numbness on the side of the neck ( 2 years in July)  Endo/Heme/Allergies: Negative.   Psychiatric/Behavioral: Positive for depression.    Blood pressure 148/92, pulse 88, temperature 97.7 F (36.5 C), temperature source Oral, resp. rate 18, height 5\' 8"  (1.727 m), weight 76.204 kg (168 lb).Body mass index is 25.54 kg/(m^2).  General Appearance: Fairly Groomed  Patent attorney::  Fair  Speech:  Clear and Coherent, Slow and not spontaneous  Volume:  Decreased  Mood:  Anxious, Depressed and worrried  Affect:  Restricted  Thought Process:  Coherent and Goal Directed  Orientation:  Full (Time, Place, and Person)  Thought Content:  WDL and Worries,  concerns, somatically focused  Suicidal Thoughts:  No  Homicidal Thoughts:  No  Memory:  Immediate;   Fair Recent;   Fair Remote;   Fair  Judgement:  Fair  Insight:  Present  Psychomotor Activity:  Decreased  Concentration:  Fair  Recall:  Fair  Akathisia:  No  Handed:  Right  AIMS (if indicated):     Assets:  Desire for Improvement Housing  Sleep:  Number of Hours: 4.25    Current Medications: Current Facility-Administered Medications  Medication Dose Route Frequency Provider Last Rate Last Dose  . acetaminophen (TYLENOL) tablet 650 mg  650 mg Oral Q6H PRN Verne Spurr, PA-C      . albuterol (PROVENTIL HFA;VENTOLIN HFA) 108 (90 BASE) MCG/ACT inhaler 2 puff  2 puff Inhalation Q6H PRN Jorje Guild, PA-C   2 puff at 10/21/12 2202  . alum & mag hydroxide-simeth (MAALOX/MYLANTA) 200-200-20 MG/5ML suspension 30 mL  30 mL Oral Q4H PRN Verne Spurr, PA-C      . chlordiazePOXIDE (LIBRIUM) capsule 25 mg  25 mg Oral Q6H PRN Verne Spurr, PA-C      . chlordiazePOXIDE (LIBRIUM) capsule 50 mg  50 mg Oral Once Verne Spurr, PA-C      . gabapentin (NEURONTIN) capsule 100 mg  100 mg Oral TID Jorje Guild, PA-C   100 mg at 10/23/12 0842  . lisinopril (PRINIVIL,ZESTRIL) tablet 20 mg  20 mg Oral Daily Rachael Fee, MD   20 mg at 10/23/12 4098   And  .  hydrochlorothiazide (MICROZIDE) capsule 12.5 mg  12.5 mg Oral Daily Rachael Fee, MD   12.5 mg at 10/23/12 1610  . hydrOXYzine (ATARAX/VISTARIL) tablet 25 mg  25 mg Oral Q6H PRN Verne Spurr, PA-C      . loperamide (IMODIUM) capsule 2-4 mg  2-4 mg Oral PRN Verne Spurr, PA-C      . magnesium hydroxide (MILK OF MAGNESIA) suspension 30 mL  30 mL Oral Daily PRN Verne Spurr, PA-C      . multivitamin with minerals tablet 1 tablet  1 tablet Oral Daily Verne Spurr, PA-C   1 tablet at 10/23/12 0843  . ondansetron (ZOFRAN-ODT) disintegrating tablet 4 mg  4 mg Oral Q6H PRN Verne Spurr, PA-C      . thiamine (B-1) injection 100 mg  100 mg Intramuscular Once  [employer], PA-C      . thiamine (VITAMIN B-1) tablet 100 mg  100 mg Oral Daily Verne Spurr, PA-C   100 mg at 10/23/12 0843  . traZODone (DESYREL) tablet 50 mg  50 mg Oral QHS PRN Jorje Guild, PA-C        Lab Results:  Results for orders placed during the hospital encounter of 10/21/12 (from the past 48 hour(s))  GLUCOSE, CAPILLARY     Status: Abnormal   Collection Time   10/21/12 11:12 AM      Component Value Range Comment   Glucose-Capillary 157 (*) 70 - 99 mg/dL     Physical Findings: AIMS: Facial and Oral Movements Muscles of Facial Expression: None, normal Lips and Perioral Area: None, normal Jaw: None, normal Tongue: None, normal,Extremity Movements Upper (arms, wrists, hands, fingers): None, normal Lower (legs, knees, ankles, toes): None, normal, Trunk Movements Neck, shoulders, hips: None, normal, Overall Severity Severity of abnormal movements (highest score from questions above): None, normal Incapacitation due to abnormal movements: None, normal Patient's awareness of abnormal movements (rate only patient's report): No Awareness, Dental Status Current problems with teeth and/or dentures?: No Does patient usually wear dentures?: No  CIWA:  CIWA-Ar Total: 0  COWS:  COWS Total Score: 0   Treatment Plan Summary: Daily contact with patient to assess and evaluate symptoms and progress in treatment Medication management  Plan: Coping skills/Supportive approach/relapse prevention           Pursue Detox           Address the depression Medical Decision Making Problem Points:  Review of psycho-social stressors (1) Data Points:  Review of medication regiment & side effects (2)  I certify that inpatient services furnished can reasonably be expected to improve the patient's condition.   Shaelynn Dragos A 10/23/2012, 10:51 AM

## 2012-10-23 NOTE — Tx Team (Signed)
Interdisciplinary Treatment Plan Update (Adult)  Date: 10/23/2012  Time Reviewed: 9:44 AM  Progress in Treatment:  Attending groups: Yes Participating in groups: Yes Taking medication as prescribed: Yes  Tolerating medication: Yes  Family/Significant other contact made: No Patient understands diagnosis: Yes  Discussing patient identified problems/goals with staff: Yes  Medical problems stabilized or resolved: Yes  Denies suicidal/homicidal ideation: Yes  Issues/concerns per patient self-inventory:  Patient wants to discharge Other: N/A   New problem(s) identified: None Identified   Reason for Continuation of Hospitalization:  Medication stabilization Assess for Withdrawal symptoms   Interventions implemented related to continuation of hospitalization: mood stabilization, medication monitoring and adjustment, group therapy and psycho education, suicide risk assessment, collateral contact, aftercare planning, ongoing physician assessments and safety checks q 15 mins   Additional comments: N/A   Estimated length of stay: 1-2 days   Discharge Plan: CSW is assessing for appropriate referrals.   New goal(s): N/A  Review of initial/current patient goals per problem list:  1. Goal: Patient will be able to identify effective and ineffective coping patterns   Met: No   Target Date: DC  As evidenced by: Patient report and staff observation  2. Goal (s): Reduce depressive symptoms from a 10 to a 3  Met: Yes  As evidenced by: Pt rates at a 1 3. Goal(s) Address suicidal Ideation  Met: yes & no  Target Date 1/29  As evidenced by: Patient denies SI; CSW will provide SPE to pt's brother 4. Goal (s): Stabilize patient in regard to pain he is experiencing  Met: No  Target date: 2 days  As evidenced by: Pt reports neurontin is not helping and also reports [facility]   Attendees:  Patient:    Family:    Physician: Geoffery Lyons  10/23/2012 9:44  AM   Nursing: Roswell Miners, RN  10/23/2012 9:44 AM   Clinical Social Worker Ronda Fairly  10/23/2012 9:44 AM   Other: Robbie Louis, RN  10/23/2012 9:44 AM   Other: Olivia Mackie, Psych Intern  10/23/2012 9:44 AM   Other: Serena Colonel, NP  10/23/2012 9:44 AM   Other:  10/23/2012 9:44 AM   Scribe for Treatment Team:  Carney Bern, LCSWA 10/23/2012 9:44 AM

## 2012-10-23 NOTE — Progress Notes (Signed)
Patient ID: Vincent [name], male   DOB: [birth year], 67 y.o.   MRN: [id number] He has been up and to groups interacting with peers and staff. Self inventory: depression 1, hopelessness 1, denies withdrawal symptoms and SI thoughts.  Says that he does not need here that he was never sucidial. He signed the 72 hour request for discharge today  At 11:25.

## 2012-10-23 NOTE — Progress Notes (Signed)
Adult Psychoeducational Group Note  Date:  10/23/2012 Time:  1:40 PM  Group Topic/Focus:  Wellness Toolbox:   The focus of this group is to discuss various aspects of wellness, balancing those aspects and exploring ways to increase the ability to experience wellness.  Patients will create a wellness toolbox for use upon discharge.  Participation Level:  Did Not Attend  Participation Quality:    Affect:    Cognitive:    Insight:   Engagement in Group:    Modes of Intervention:    Additional Comments:  Pt refused to attend group, pt was in the room.  Isla Pence M 10/23/2012, 1:40 PM

## 2012-10-24 DIAGNOSIS — F411 Generalized anxiety disorder: Secondary | ICD-10-CM

## 2012-10-24 NOTE — Clinical Social Work Note (Addendum)
[facility] LCSW Group Therapy  10/24/2012  1:15 PM   Type of Therapy:  Group Therapy  Participation Level: Did Not Attend  Catherine Harrill, LCSWA 10/24/2012 1:49 PM   

## 2012-10-24 NOTE — Progress Notes (Signed)
Patient denied SI and HI.  Denied A/V hallucinations.  Denied pain.  Stated groups are "fine".   Is feeling better today.

## 2012-10-24 NOTE — Progress Notes (Signed)
[facility] Group Notes:  (Counselor/Nursing/MHT/Case Management/Adjunct)  Type of Therapy:  Psychoeducational Skills  Participation Level:  Minimal  Participation Quality:  Inattentive  Affect:  Blunted  Cognitive:  Alert, Appropriate and Oriented  Insight:  Appropriate  Engagement in Group:  Limited  Modes of Intervention:  Activity, Discussion, Education, Problem-solving, Rapport Building, Socialization and Support  Summary of Progress/Problems: Vincent [name] attended psychoeducational group that focused on using quality time with support systems/individuals to engage in health coping skills. Vincent [name] participated in activity guessing about self and peers. Vincent [name] was quiet and spoke only when called on while group discussed who their support systems are, how they can spend positive quality time with them as a coping skills and a way to strengthen their relationship. Vincent [name] was given a homework assignment to find two ways to improve his support systems and twenty activities he can do to spend quality time with his supports.   Wandra Scot 10/24/2012 1:08 PM

## 2012-10-24 NOTE — Progress Notes (Signed)
[facility] LCSW Aftercare Discharge Planning Group Note  10/24/2012 9:38 AM  Participation Quality:  Sharing  Affect:  Flat  Cognitive:  Oriented  Insight:  Limited  Engagement in Group:  Lacking  Modes of Intervention:  Exploration, Socialization and Support  Summary of Progress/Problems:  Vincent [name] continues to report he is ready to go and is turning down all medications offered and does not need not want detox.  Patient reports that he has followup appointment in [place] at [place] on 10/31/12 at 1 pm with neurologist.   [name] 10/24/2012, 9:38 AM

## 2012-10-24 NOTE — Progress Notes (Signed)
Patient ID: Vincent [name], male   DOB: [birth year], 67 y.o.   MRN: [id number] [facility] MD Progress Note  10/24/2012 12:28 PM Vincent [name]  MRN:  [id number]  Subjective: "I'm doing okay. I agreed to come here because I thought that I will get to the [place] much quicker this way. I have numbness to my left arm that travels to my lower jaw. [facility] to [facility] out. I was asked if I was drinking alcohol, I said yes. The person asked me again if I was depressed, I answered yes again. They called the cops and here I am. Now I am depressed for all the ignorance and the run around I am being given. I have an appointment with a neurologist next Tuesday. I would like to know what is going on with me. I drank alcohol the day that I called the [place], that is how I cope".  Diagnosis:  Major Depression, Anxiety Disorder NO, Alcohol Abuse R/O Dependence  ADL's:  Intact  Sleep: four hours straight, usual  Appetite:  Fair  Suicidal Ideation:  Plan:  Denies Intent:  Denies Means:  Denies Homicidal Ideation:  Plan:  Denies Intent:  Denies Means:  Denies AEB (as evidenced by):  Psychiatric Specialty Exam: Review of Systems  Constitutional: Negative.   HENT: Positive for neck pain.   Eyes: Negative.   Respiratory: Positive for shortness of breath and wheezing.        One pack a day, (50 years)  Cardiovascular: Negative.   Gastrointestinal: Negative.   Genitourinary: Negative.   Musculoskeletal: Positive for myalgias.  Skin: Negative.   Neurological: Positive for sensory change.       Both hands numb, pain left shoulder, numbness on the side of the neck ( 2 years in July)  Endo/Heme/Allergies: Negative.   Psychiatric/Behavioral: Positive for depression.    Blood pressure 132/77, pulse 87, temperature 98.2 F (36.8 C), temperature source Oral, resp. rate 18, height 5\' 8"  (1.727 m), weight 76.204 kg (168 lb).Body mass index is 25.54 kg/(m^2).  General  Appearance: Fairly Groomed  Patent attorney::  Fair  Speech:  Clear and Coherent, Slow and not spontaneous  Volume:  Decreased  Mood:  Anxious, Depressed and worrried  Affect:  Restricted  Thought Process:  Coherent and Goal Directed  Orientation:  Full (Time, Place, and Person)  Thought Content:  WDL and Worries, concerns, somatically focused  Suicidal Thoughts:  No  Homicidal Thoughts:  No  Memory:  Immediate;   Fair Recent;   Fair Remote;   Fair  Judgement:  Fair  Insight:  Present  Psychomotor Activity:  Decreased  Concentration:  Fair  Recall:  Fair  Akathisia:  No  Handed:  Right  AIMS (if indicated):     Assets:  Desire for Improvement Housing  Sleep:  Number of Hours: 5.75    Current Medications: Current Facility-Administered Medications  Medication Dose Route Frequency Provider Last Rate Last Dose  . acetaminophen (TYLENOL) tablet 650 mg  650 mg Oral Q6H PRN Verne Spurr, PA-C      . albuterol (PROVENTIL HFA;VENTOLIN HFA) 108 (90 BASE) MCG/ACT inhaler 2 puff  2 puff Inhalation Q6H PRN Jorje Guild, PA-C   2 puff at 10/21/12 2202  . alum & mag hydroxide-simeth (MAALOX/MYLANTA) 200-200-20 MG/5ML suspension 30 mL  30 mL Oral Q4H PRN Verne Spurr, PA-C      . chlordiazePOXIDE (LIBRIUM) capsule 25 mg  25  mg Oral Q6H PRN Verne Spurr, PA-C      . chlordiazePOXIDE (LIBRIUM) capsule 50 mg  50 mg Oral Once Verne Spurr, PA-C      . gabapentin (NEURONTIN) capsule 100 mg  100 mg Oral TID Jorje Guild, PA-C   100 mg at 10/23/12 0842  . lisinopril (PRINIVIL,ZESTRIL) tablet 20 mg  20 mg Oral Daily Rachael Fee, MD   20 mg at 10/24/12 1610   And  . hydrochlorothiazide (MICROZIDE) capsule 12.5 mg  12.5 mg Oral Daily Rachael Fee, MD   12.5 mg at 10/24/12 9604  . hydrOXYzine (ATARAX/VISTARIL) tablet 25 mg  25 mg Oral Q6H PRN Verne Spurr, PA-C      . loperamide (IMODIUM) capsule 2-4 mg  2-4 mg Oral PRN Verne Spurr, PA-C      . magnesium hydroxide (MILK OF MAGNESIA) suspension 30 mL  30  mL Oral Daily PRN Verne Spurr, PA-C      . multivitamin with minerals tablet 1 tablet  1 tablet Oral Daily Verne Spurr, PA-C   1 tablet at 10/24/12 [phone]  . ondansetron (ZOFRAN-ODT) disintegrating tablet 4 mg  4 mg Oral Q6H PRN Verne Spurr, PA-C      . thiamine (B-1) injection 100 mg  100 mg Intramuscular Once [employer], PA-C      . thiamine (VITAMIN B-1) tablet 100 mg  100 mg Oral Daily Verne Spurr, PA-C   100 mg at 10/24/12 8119  . traZODone (DESYREL) tablet 50 mg  50 mg Oral QHS PRN Jorje Guild, PA-C        Lab Results:  No results found for this or any previous visit (from the past 48 hour(s)).  Physical Findings: AIMS: Facial and Oral Movements Muscles of Facial Expression: None, normal Lips and Perioral Area: None, normal Jaw: None, normal Tongue: None, normal,Extremity Movements Upper (arms, wrists, hands, fingers): None, normal Lower (legs, knees, ankles, toes): None, normal, Trunk Movements Neck, shoulders, hips: None, normal, Overall Severity Severity of abnormal movements (highest score from questions above): None, normal Incapacitation due to abnormal movements: None, normal Patient's awareness of abnormal movements (rate only patient's report): No Awareness, Dental Status Current problems with teeth and/or dentures?: No Does patient usually wear dentures?: No  CIWA:  CIWA-Ar Total: 0  COWS:  COWS Total Score: 0   Treatment Plan Summary: Daily contact with patient to assess and evaluate symptoms and progress in treatment Medication management  Plan: Supportive approach/coping skills, relapse prevention. Encouraged out of room, participation in group sessions and application of coping skills when distressed. Will continue to monitor response to/adverse effects of medications in use to assure effectiveness. Continue to monitor mood, behavior and interaction with staff and other patients. Continue current plan of care.   Medical Decision Making Problem Points:   Review of psycho-social stressors (1) Data Points:  Review of medication regiment & side effects (2)  I certify that inpatient services furnished can reasonably be expected to improve the patient's condition.   Armandina Stammer I 10/24/2012, 12:28 PM

## 2012-10-24 NOTE — Progress Notes (Signed)
Pt observed in the dayroom watching TV and talking with other patients.  Interaction with this writer was minimal.  Pt said he was fine and had no complaints.  Pt denies SI/HI/AV.  Encouraged pt to make his needs known to staff.  Pt voiced understanding.  Support/encouragement given.  Safety maintained with q15 minute checks.

## 2012-10-24 NOTE — Progress Notes (Signed)
Patient ID: Vincent [name], male   DOB: 12/06/1945, 67 y.o.   MRN: [id number] He has been up  And to groups interacting with peers and staff. Elf inventory: depression and hopelessness at 1 denies w/d symptoms and SI. Tthe rash he has on arms, trunk and legs  Has been there several months and that [facility] .

## 2012-10-24 NOTE — Progress Notes (Signed)
Patient ID: Vincent [name], male   DOB: 03-16-1946, 67 y.o.   MRN: [id number] PER STATE REGULATIONS 482.30  THIS CHART WAS REVIEWED FOR MEDICAL NECESSITY WITH RESPECT TO THE PATIENT'S ADMISSION/ DURATION OF STAY.  NEXT REVIEW DATE: 10/27/2012  Willa Rough, RN, BSN CASE MANAGER

## 2012-10-25 NOTE — Progress Notes (Signed)
[facility] LCSW Aftercare Discharge Planning Group Note  10/25/2012 10:31 AM  Participation Quality:  Minimal  Affect:  Flat  Cognitive:  Alert and Oriented  Insight:  Limited  Engagement in Group:  Limited  Modes of Intervention:  Exploration, Rapport Building and Support  Summary of Progress/Problems:  Vincent [name] continues to report he is "waiting for 72 hour release, I don't need anything here, I just want to go home."  Patient later shared "I do not have a problem with alcohol, that is how I cope."  [name] 10/25/2012, 10:31 AM

## 2012-10-25 NOTE — Progress Notes (Signed)
Adult Psychoeducational Group Note  Date:  10/25/2012 Time:  1:26 PM  Group Topic/Focus:  Personal Choices and Values:   The focus of this group is to help patients assess and explore the importance of values in their lives, how their values affect their decisions, how they express their values and what opposes their expression.  Participation Level:  Active  Participation Quality:  Appropriate, Sharing and Supportive  Affect:  Appropriate  Cognitive:  Appropriate  Insight: Appropriate  Engagement in Group:  Supportive  Modes of Intervention:  Support  Additional Comments:  Pt attended group.  Isla Pence M 10/25/2012, 1:26 PM

## 2012-10-25 NOTE — Progress Notes (Signed)
[facility] LCSW Group Therapy  10/25/2012 2:59 PM  Type of Therapy:  Group Therapy  Participation Level:  Minimal, Patient came in for the last ten minutes of group and listened to others who were sharing. CSW acknowledged his presence and said it was good to see him in group.   [name] 10/25/2012, 2:59 PM

## 2012-10-25 NOTE — Progress Notes (Addendum)
D: Patient denies SI/HI and auditory and visual hallucinations. The patient has an irritable mood and affect. The patient rates his depression and hopelessness both a 1 out 10 (1 low/10 high). The patient reports sleeping well and states that his appetite is good and that his energy level is normal.  A: Patient given emotional support from RN. Patient encouraged to come to staff with concerns and/or questions. Patient's medication routine continued. Patient's orders and plan of care reviewed.  R: Patient remains cooperative. Will continue to monitor patient q15 minutes for safety.

## 2012-10-25 NOTE — Progress Notes (Signed)
[facility] INPATIENT:  Family/Significant Other Suicide Prevention Education  Suicide Prevention Education:  Education Completed; Jared Whorley pt's brother at [phone] has been identified as the person(s) who will aid the patient in the event of a mental health crisis (suicidal ideations/suicide attempt).  With written consent from the patient, the family member/significant other has been provided the following suicide prevention education, prior to the and/or following the discharge of the patient.  The suicide prevention education provided includes the following:  Suicide risk factors  Suicide prevention and interventions  National Suicide Hotline telephone number  [facility] assessment telephone number  [facility] Emergency Assistance 911  [facility] and/or Residential Mobile Crisis Unit telephone number  Request made of family/significant other to:  Remove weapons (e.g., guns, rifles, knives), all items previously/currently identified as safety concern.    Remove drugs/medications (over-the-counter, prescriptions, illicit drugs), all items previously/currently identified as a safety concern.  The family member/significant other verbalizes understanding of the suicide prevention education information provided.  The family member/significant other agrees to remove the items of safety concern listed above.  [name] 10/25/2012, 2:57 PM

## 2012-10-25 NOTE — Progress Notes (Signed)
[facility] MD Progress Note  10/25/2012 6:32 PM Vincent [name]  MRN:  [id number] Subjective:  Vincent [name] continues to endorse that he drank trying to cope with the frustration he was feeling about his on going pain, numbness of his arms and the fact that it has taken so long for him to have the [place] do something to help him. Continues to deny that he has a problem with alcohol. He is going to go back to the motel he was staying. [facility] Diagnosis:   Axis I: Alcohol Abuse R/O Dependence, Depressive Disorder NOS Axis II: Deferred Axis III:  Past Medical History  Diagnosis Date  . Depression   . Suicide and self-inflicted injury Aug, 2012    cut wrist  . Diabetes mellitus   . Stroke   . Hypertension   . Numbness   . Asthma    Axis IV: other psychosocial or environmental problems and problems with access to health care services Axis V: 51-60 moderate symptoms  ADL's:  Intact  Sleep: Fair  Appetite:  Fair  Suicidal Ideation:  Plan:  Denies Intent:  Denies Means:  Denies Homicidal Ideation:  Plan:  Denies Intent:  Denies Means:  Denies AEB (as evidenced by):  Psychiatric Specialty Exam: Review of Systems  Constitutional: Negative.   HENT: Negative.   Eyes: Negative.   Respiratory: Negative.   Cardiovascular: Negative.   Gastrointestinal: Negative.   Genitourinary: Negative.   Musculoskeletal:       Bilateral arm pain,numbness  Skin: Negative.   Neurological: Positive for sensory change.  Endo/Heme/Allergies: Negative.   Psychiatric/Behavioral: Positive for depression.    Blood pressure 136/78, pulse 89, temperature 98.3 F (36.8 C), temperature source Oral, resp. rate 18, height 5\' 8"  (1.727 m), weight 76.204 kg (168 lb).Body mass index is 25.54 kg/(m^2).  General Appearance: Fairly Groomed  Patent attorney::  Fair  Speech:  Clear and Coherent and Slow  Volume:  Decreased  Mood:  worried  Affect:  Restricted  Thought Process:  Coherent and Goal  Directed  Orientation:  Full (Time, Place, and Person)  Thought Content:  WDL and physical condition  Suicidal Thoughts:  No  Homicidal Thoughts:  No  Memory:  Immediate;   Fair Recent;   Fair Remote;   Fair  Judgement:  Fair  Insight:  Shallow  Psychomotor Activity:  Decreased  Concentration:  Fair  Recall:  Fair  Akathisia:  No  Handed:  Right  AIMS (if indicated):     Assets:  Desire for Improvement  Sleep:  Number of Hours: 5.5    Current Medications: Current Facility-Administered Medications  Medication Dose Route Frequency Provider Last Rate Last Dose  . acetaminophen (TYLENOL) tablet 650 mg  650 mg Oral Q6H PRN Verne Spurr, PA-C      . albuterol (PROVENTIL HFA;VENTOLIN HFA) 108 (90 BASE) MCG/ACT inhaler 2 puff  2 puff Inhalation Q6H PRN Jorje Guild, PA-C   2 puff at 10/21/12 2202  . alum & mag hydroxide-simeth (MAALOX/MYLANTA) 200-200-20 MG/5ML suspension 30 mL  30 mL Oral Q4H PRN Verne Spurr, PA-C      . chlordiazePOXIDE (LIBRIUM) capsule 50 mg  50 mg Oral Once [employer], PA-C      . gabapentin (NEURONTIN) capsule 100 mg  100 mg Oral TID Jorje Guild, PA-C   100 mg at 10/23/12 0842  . lisinopril (PRINIVIL,ZESTRIL) tablet 20 mg  20 mg Oral Daily Rachael Fee, MD   20 mg at 10/25/12 [phone]  And  . hydrochlorothiazide (MICROZIDE) capsule 12.5 mg  12.5 mg Oral Daily Rachael Fee, MD   12.5 mg at 10/25/12 0743  . magnesium hydroxide (MILK OF MAGNESIA) suspension 30 mL  30 mL Oral Daily PRN Verne Spurr, PA-C      . multivitamin with minerals tablet 1 tablet  1 tablet Oral Daily Verne Spurr, PA-C   1 tablet at 10/25/12 0743  . thiamine (B-1) injection 100 mg  100 mg Intramuscular Once [employer], PA-C      . thiamine (VITAMIN B-1) tablet 100 mg  100 mg Oral Daily Verne Spurr, PA-C   100 mg at 10/25/12 0743  . traZODone (DESYREL) tablet 50 mg  50 mg Oral QHS PRN Jorje Guild, PA-C        Lab Results: No results found for this or any previous visit (from the past 48  hour(s)).  Physical Findings: AIMS: Facial and Oral Movements Muscles of Facial Expression: None, normal Lips and Perioral Area: None, normal Jaw: None, normal Tongue: None, normal,Extremity Movements Upper (arms, wrists, hands, fingers): None, normal Lower (legs, knees, ankles, toes): None, normal, Trunk Movements Neck, shoulders, hips: None, normal, Overall Severity Severity of abnormal movements (highest score from questions above): None, normal Incapacitation due to abnormal movements: None, normal Patient's awareness of abnormal movements (rate only patient's report): No Awareness, Dental Status Current problems with teeth and/or dentures?: No Does patient usually wear dentures?: No  CIWA:  CIWA-Ar Total: 0  COWS:  COWS Total Score: 0   Treatment Plan Summary: Daily contact with patient to assess and evaluate symptoms and progress in treatment Medication management  Plan: Supportive approach/coping skills/relapse prevention      Medical Decision Making Problem Points:  Review of psycho-social stressors (1) Data Points:  Review of medication regiment & side effects (2)  I certify that inpatient services furnished can reasonably be expected to improve the patient's condition.   Andrey Mccaskill A 10/25/2012, 6:32 PM

## 2012-10-26 DIAGNOSIS — F1020 Alcohol dependence, uncomplicated: Principal | ICD-10-CM

## 2012-10-26 MED ORDER — ALBUTEROL SULFATE HFA 108 (90 BASE) MCG/ACT IN AERS: 2.0000 | INHALATION_SPRAY | Freq: Four times a day (QID) | RESPIRATORY_TRACT | Status: DC | PRN

## 2012-10-26 MED ORDER — TRAZODONE HCL 50 MG PO TABS: 50.0000 mg | ORAL_TABLET | Freq: Every evening | ORAL | Status: DC | PRN

## 2012-10-26 MED ORDER — GABAPENTIN 100 MG PO CAPS: 100.0000 mg | ORAL_CAPSULE | Freq: Three times a day (TID) | ORAL | Status: DC

## 2012-10-26 MED ORDER — LISINOPRIL-HYDROCHLOROTHIAZIDE 20-12.5 MG PO TABS: 1.0000 | ORAL_TABLET | Freq: Every day | ORAL | Status: DC

## 2012-10-26 NOTE — Discharge Summary (Signed)
Physician Discharge Summary Note  Patient:  Vincent [name] is an 67 y.o., male MRN:  [id number] DOB:  02-12-1946 Patient phone:  [phone] (home)  Patient address:   [address] [place] [address],   Date of Admission:  10/21/2012  Date of Discharge: 10/26/12  Reason for Admission:  Suicidal ideations/alcohol intoxication.  Discharge Diagnoses: Active Problems:  Bilateral arm weakness  Alcohol dependence  Review of Systems  Constitutional: Negative.   HENT: Negative.   Eyes: Negative.   Respiratory: Negative.   Cardiovascular: Negative.   Gastrointestinal: Negative.   Genitourinary: Negative.   Musculoskeletal: Negative.   Skin: Negative.   Neurological: Negative.   Endo/Heme/Allergies: Negative.   Psychiatric/Behavioral: Positive for substance abuse (Hx of). Negative for depression, suicidal ideas, hallucinations and memory loss. The patient is nervous/anxious (Stabilized with medication prior to discharge.) and has insomnia (Stabilized with medication prior to discharge.).    Axis Diagnosis:   AXIS I:  Alcohol dependence AXIS II:  Deferred AXIS III:   Past Medical History  Diagnosis Date  . Depression   . Suicide and self-inflicted injury Aug, 2012    cut wrist  . Diabetes mellitus   . Stroke   . Hypertension   . Numbness   . Asthma    AXIS IV:  other psychosocial or environmental problems AXIS V:  63  Level of Care:  OP  Hospital Course:  Vincent [name] is a 67 year old white male who was taken to the emergency department by the police department after he called the [facility] crisis line trying to get help for his numbness in his extremities. He reports that this has been a problem for approximately 2 years, and he can't seem to get the [place] to help him. While on the crisis line he reported that he had suicidal thoughts of cutting himself or walking out in front of a truck on the highway. He also reports that he drinks frequently, and states that  he drinks typically 4-5 beers daily.  Vincent [name] has been hospitalized in this facility in August of 2013 after having cut his wrists with a knife in a suicidal gesture. These wounds required sutures. He has one prior suicide attempt by overdosing on opiate pain medication.  Vincent [name] cites multiple psychosocial stressors including homelessness, financial difficulties, and poor health. He is reluctant to accept any medication for depression, but he is willing to take medication that will help alleviate the numbness in his extremities. He denies any withdrawal symptoms or cravings for alcohol.   Upon admission in this hospital, Vincent [name] was started on Librium protocol for his alcohol detoxification and to stabilize his body from alcohol intoxication. He was also enrolled in group counseling sessions and activities to learn coping skills that should help him after discharge to cope better and manage his substance abuse issues to maintain a much longer sobriety. He also attended AA/NA meetings being offered and held on this unit. He has some previous and or identifiable medical conditions that required treatment and or monitoring. He received medication management for all those health issues as well. He was monitored closely response and for any potential problems that may arise as a result of and or during detoxification treatment. Patient tolerated his treatment regimen and detoxification treatment without any significant adverse effects and or reactions presented.  Patient attended treatment team meeting this am and met with the team. His symptoms, substance abuse issues, response to to treatment and discharge plans discussed. Patient endorsed that he is doing well and  stable for discharge to pursue the next phase of his substance abuse treatment.  He was encouraged to join/attend AA/NA meetings being offered within his community. He is encouraged to get a trusted sponsor from the advise of others or from whomever  within the AA meetings seems to make sense, and has a proven track record, and will hold him responsible for his sobriety, and both expects and insists on his total abstinence from alcohol.  It then was agreed upon between patient and the team that he will be discharged to his home.  However, to maintain stability, patient will follow-up care at the [facility] in [place] on either 10/29/12 or 10/30/12. Vincent [name] is instructed and informed that this is a walk-in appointment between the hours of 08:00 am and 03:00 pm, and should make every effort to make this appointment timely.   Upon discharge, patient adamantly denies suicidal, homicidal ideations, auditory, visual hallucinations, delusional thinking and or withdrawal symptoms. Patient left [facility] with all personal belongings in no apparent distress. He received 4 days worth samples of his discharge medications. Transportation per patient's friend.   Consults:  None  Significant Diagnostic Studies:  labs: CBC with diff, CMP, UDS, Toxicology tests.  Discharge Vitals:   Blood pressure 161/74, pulse 91, temperature 98 F (36.7 C), temperature source Oral, resp. rate 18, height 5\' 8"  (1.727 m), weight 76.204 kg (168 lb). Body mass index is 25.54 kg/(m^2). Lab Results:   No results found for this or any previous visit (from the past 72 hour(s)).  Physical Findings: AIMS: Facial and Oral Movements Muscles of Facial Expression: None, normal Lips and Perioral Area: None, normal Jaw: None, normal Tongue: None, normal,Extremity Movements Upper (arms, wrists, hands, fingers): None, normal Lower (legs, knees, ankles, toes): None, normal, Trunk Movements Neck, shoulders, hips: None, normal, Overall Severity Severity of abnormal movements (highest score from questions above): None, normal Incapacitation due to abnormal movements: None, normal Patient's awareness of abnormal movements (rate only patient's report): No Awareness, Dental  Status Current problems with teeth and/or dentures?: No Does patient usually wear dentures?: No  CIWA:  CIWA-Ar Total: 0  COWS:  COWS Total Score: 0   Psychiatric Specialty Exam: See Psychiatric Specialty Exam and Suicide Risk Assessment completed by Attending Physician prior to discharge.  Discharge destination:  Home  Is patient on multiple antipsychotic therapies at discharge:  No   Has Patient had three or more failed trials of antipsychotic monotherapy by history:  No  Recommended Plan for Multiple Antipsychotic Therapies: NA     Medication List     As of 10/26/2012  3:46 PM    TAKE these medications      Indication    albuterol 108 (90 BASE) MCG/ACT inhaler   Commonly known as: PROVENTIL HFA;VENTOLIN HFA   Inhale 2 puffs into the lungs every 6 (six) hours as needed for shortness of breath. For: Wheezing/shortness of breath.    Indication: Asthma      gabapentin 100 MG capsule   Commonly known as: NEURONTIN   Take 1 capsule (100 mg total) by mouth 3 (three) times daily. For anxiety/pain control    Indication: Agitation, Pain      lisinopril-hydrochlorothiazide 20-12.5 MG per tablet   Commonly known as: PRINZIDE,ZESTORETIC   Take 1 tablet by mouth daily. For high blood pressure control    Indication: High Blood Pressure      traZODone 50 MG tablet   Commonly known as: DESYREL   Take 1 tablet (50 mg  total) by mouth at bedtime as needed for sleep. For depression/sleep    Indication: Trouble Sleeping, Major Depressive Disorder         Follow-up Information    Follow up with [facility]. Today. (Go to [facility] walkin clinic next Monday 2/3 or Tuesday 2/4 between 8AM and 3PM f)    Contact information:   [address]  [place], [place]  [address]  [facility] [phone]  FAX: [phone]         Follow-up recommendations:  Activity:  as tolerated Other:  Keep all scheduled follow-up appointments as recommended.    Comments:  Take all your medications as prescribed by  your mental healthcare provider. Report any adverse effects and or reactions from your medicines to your outpatient provider promptly. Patient is instructed and cautioned to not engage in alcohol and or illegal drug use while on prescription medicines. In the event of worsening symptoms, patient is instructed to call the crisis hotline, 911 and or go to the nearest ED for appropriate evaluation and treatment of symptoms. Follow-up with your primary care provider for your other medical issues, concerns and or health care needs.     Total Discharge Time:  Greater than 30 minutes.  SignedArmandina Stammer I 10/26/2012, 3:46 PM

## 2012-10-26 NOTE — Progress Notes (Signed)
Patient ID: Vincent [name], male   DOB: 1945-10-06, 67 y.o.   MRN: [id number] He has been discharged , stated that he had a place to go and that he was going to his brothers tomorrow. He refused to take his prescription and sample medications with him. Said" I didn't  Need them here and I don't  need them now. He voiced understanding of discharge instruction and of follow up. All belonging taken home with him.

## 2012-10-26 NOTE — Progress Notes (Signed)
[facility] LCSW Aftercare Discharge Planning Group Note   10/26/2012 10:42 AM  Participation Quality:  Appropriate  Affect:  Appropriate  Cognitive:  Appropriate  Insight:  Improving  Engagement in Group:  Engaged  Modes of Intervention:  Clarification, Education, Rapport Building and Support  Summary of Progress/Problems:  Pt denies both suicidal ideation and homicidal ideation.  Vincent [name] feels ready for discharge which is planned today and will be contacting friend for ride.  Although patient has refused detox protocol and signed 72 hour release request, Vincent [name] feels the hospitalization has been helpful and reports "It has given me time to plan for the future."    [name] 10/26/2012, 10:42 AM

## 2012-10-26 NOTE — Progress Notes (Addendum)
Patient ID: Vincent [name], male   DOB: 26-Jul-1946, 67 y.o.   MRN: [id number] He has been up and to groups interacting with peers and staff. Stated that he was going to leave today.

## 2012-10-26 NOTE — [facility] Suicide Risk Assessment (Signed)
Suicide Risk Assessment  Discharge Assessment     Demographic Factors:  Male, Age 66 or older, Caucasian and Living alone  Mental Status Per Nursing Assessment::   On Admission:  NA  Current Mental Status by Physician: In full contact with reality. There are no suicidal ideas, plans or intent. His mood is euthymic his affect is appropriate. He is encouraged by the fact [facility]. He is looking forward to get the problem in his arms taking care of. He still minimizes his use of alcohol, but states he is not going to be drinking.     Loss Factors: Decline in physical health  Historical Factors: NA  Risk Reduction Factors:   wanting to feel better/hope  Continued Clinical Symptoms:  Alcohol/Substance Abuse/Dependencies  Cognitive Features That Contribute To Risk:  Closed-mindedness    Suicide Risk:  Minimal: No identifiable suicidal ideation.  Patients presenting with no risk factors but with morbid ruminations; may be classified as minimal risk based on the severity of the depressive symptoms  Discharge Diagnoses:   AXIS I:  Alcohol Dependence, Adjustment disorder with depressed mood AXIS II:  Deferred AXIS III:   Past Medical History  Diagnosis Date  . Depression   . Suicide and self-inflicted injury Aug, 2012    cut wrist  . Diabetes mellitus   . Stroke   . Hypertension   . Numbness   . Asthma    AXIS IV:  other psychosocial or environmental problems and problems with access to health care services AXIS V:  61-70 mild symptoms  Plan Of Care/Follow-up recommendations:  Activity:  As tolerated Diet:  regular Pursue outpatient follow up Encourage abstinence Is patient on multiple antipsychotic therapies at discharge:  No   Has Patient had three or more failed trials of antipsychotic monotherapy by history:  No  Recommended Plan for Multiple Antipsychotic Therapies: N/A   Fontaine Kossman A 10/26/2012, 12:06 PM

## 2012-11-02 NOTE — Discharge Summary (Signed)
Agree with assessment and plan Adelfa Lozito A. Araeya Lamb, M.D. 

## 2012-11-04 ENCOUNTER — Emergency Department (HOSPITAL_COMMUNITY)
Admission: EM | Admit: 2012-11-04 | Discharge: 2012-11-04 | Disposition: A | Discharge status: Home / Self Care | Payer: Non-veteran care | Attending: Emergency Medicine | Admitting: Emergency Medicine

## 2012-11-04 ENCOUNTER — Encounter (HOSPITAL_COMMUNITY): Payer: Self-pay | Admitting: Emergency Medicine

## 2012-11-04 DIAGNOSIS — Z79899 Other long term (current) drug therapy: Secondary | ICD-10-CM | POA: Insufficient documentation

## 2012-11-04 DIAGNOSIS — Z8659 Personal history of other mental and behavioral disorders: Secondary | ICD-10-CM | POA: Insufficient documentation

## 2012-11-04 DIAGNOSIS — F17200 Nicotine dependence, unspecified, uncomplicated: Secondary | ICD-10-CM | POA: Insufficient documentation

## 2012-11-04 DIAGNOSIS — E860 Dehydration: Secondary | ICD-10-CM | POA: Insufficient documentation

## 2012-11-04 DIAGNOSIS — Z8673 Personal history of transient ischemic attack (TIA), and cerebral infarction without residual deficits: Secondary | ICD-10-CM | POA: Insufficient documentation

## 2012-11-04 DIAGNOSIS — R42 Dizziness and giddiness: Secondary | ICD-10-CM

## 2012-11-04 DIAGNOSIS — J45909 Unspecified asthma, uncomplicated: Secondary | ICD-10-CM | POA: Insufficient documentation

## 2012-11-04 DIAGNOSIS — I10 Essential (primary) hypertension: Secondary | ICD-10-CM | POA: Insufficient documentation

## 2012-11-04 DIAGNOSIS — E119 Type 2 diabetes mellitus without complications: Secondary | ICD-10-CM | POA: Insufficient documentation

## 2012-11-04 LAB — CBC WITH DIFFERENTIAL/PLATELET
Eosinophils Absolute: 0.2 10*3/uL (ref 0.0–0.7)
Eosinophils Relative: 1 % (ref 0–5)
Lymphs Abs: 2.3 10*3/uL (ref 0.7–4.0)
MCH: 32.1 pg (ref 26.0–34.0)
MCV: 92 fL (ref 78.0–100.0)
Platelets: 163 10*3/uL (ref 150–400)
RBC: 5.26 MIL/uL (ref 4.22–5.81)
RDW: 12.4 % (ref 11.5–15.5)

## 2012-11-04 LAB — COMPREHENSIVE METABOLIC PANEL
ALT: 28 U/L (ref 0–53)
Calcium: 9.3 mg/dL (ref 8.4–10.5)
GFR calc Af Amer: 77 mL/min — ABNORMAL LOW (ref >90)
Glucose, Bld: 101 mg/dL — ABNORMAL HIGH (ref 70–99)
Sodium: 137 mEq/L (ref 135–145)
Total Protein: 7.6 g/dL (ref 6.0–8.3)

## 2012-11-04 LAB — ETHANOL: Alcohol, Ethyl (B): <11 mg/dL (ref 0–11)

## 2012-11-04 MED ORDER — SODIUM CHLORIDE 0.9 % IV BOLUS (SEPSIS)
1000.0000 mL | Freq: Once | INTRAVENOUS | Status: AC
  Administered 2012-11-04: 1000 mL via INTRAVENOUS

## 2012-11-04 NOTE — ED Provider Notes (Signed)
History     CSN: [id number]  Arrival date & time 11/04/12  1446   First MD Initiated Contact with Patient 11/04/12 1533      Chief Complaint  Patient presents with  . Dizziness    (Consider location/radiation/quality/duration/timing/severity/associated sxs/prior treatment) Patient is a 67 y.o. male presenting with neurologic complaint. The history is provided by the patient.  Neurologic Problem The primary symptoms include dizziness. Primary symptoms do not include headaches, fever, nausea or vomiting. Episode onset: 4-6 hrs ago. The symptoms are improving. The neurological symptoms are diffuse. Context: while walking.  He describes the dizziness as lightheadedness. The dizziness began today. The dizziness has been gradually improving since its onset. Dizziness is a chronic problem. Associated with: walking. Dizziness also occurs with weakness (diffuse). Dizziness does not occur with nausea or vomiting.  Additional symptoms include weakness (diffuse). Medical issues also include cerebral vascular accident, diabetes and hypertension.    Past Medical History  Diagnosis Date  . Depression   . Suicide and self-inflicted injury Aug, 2012    cut wrist  . Diabetes mellitus   . Stroke   . Hypertension   . Numbness   . Asthma     Past Surgical History  Procedure Laterality Date  . Appendectomy    . Tonsillectomy    . Shoulder arthroscopy      No family history on file.  History  Substance Use Topics  . Smoking status: Current Every Day Smoker -- 1.00 packs/day for 50 years    Types: Cigarettes  . Smokeless tobacco: Never Used  . Alcohol Use: 4.8 oz/week    8 Cans of beer per week     Comment: had 8 cans today.  states everyday is different      Review of Systems  Constitutional: Negative for fever.  HENT: Negative for rhinorrhea, drooling and neck pain.   Eyes: Negative for pain.  Respiratory: Negative for cough and shortness of breath.   Cardiovascular: Negative for  chest pain and leg swelling.  Gastrointestinal: Negative for nausea, vomiting, abdominal pain and diarrhea.  Genitourinary: Negative for dysuria and hematuria.  Musculoskeletal: Negative for gait problem.  Skin: Negative for color change.  Neurological: Positive for dizziness and weakness (diffuse). Negative for numbness and headaches.  Hematological: Negative for adenopathy.  Psychiatric/Behavioral: Negative for behavioral problems.  All other systems reviewed and are negative.    Allergies  Fluzone  Home Medications   Current Outpatient Rx  Name  Route  Sig  Dispense  Refill  . albuterol (PROVENTIL HFA;VENTOLIN HFA) 108 (90 BASE) MCG/ACT inhaler   Inhalation   Inhale 2 puffs into the lungs every 6 (six) hours as needed for shortness of breath. For: Wheezing/shortness of breath.         . lisinopril-hydrochlorothiazide (PRINZIDE,ZESTORETIC) 20-12.5 MG per tablet   Oral   Take 1 tablet by mouth daily. For high blood pressure control           BP 137/68  Pulse 73  Temp(Src) 98.1 F (36.7 C) (Oral)  Resp 14  SpO2 99%  Physical Exam  Nursing note and vitals reviewed. Constitutional: He is oriented to person, place, and time. He appears well-developed and well-nourished.  HENT:  Head: Normocephalic and atraumatic.  Right Ear: External ear normal.  Left Ear: External ear normal.  Nose: Nose normal.  Mouth/Throat: Oropharynx is clear and moist. No oropharyngeal exudate.  Eyes: Conjunctivae and EOM are normal. Pupils are equal, round, and reactive to light.  Neck: Normal range  of motion. Neck supple.  Cardiovascular: Normal rate, regular rhythm, normal heart sounds and intact distal pulses.  Exam reveals no gallop and no friction rub.   No murmur heard. Pulmonary/Chest: Effort normal and breath sounds normal. No respiratory distress. He has no wheezes.  Abdominal: Soft. Bowel sounds are normal. He exhibits no distension. There is no tenderness. There is no rebound and  no guarding.  Musculoskeletal: Normal range of motion. He exhibits no edema and no tenderness.  Neurological: He is alert and oriented to person, place, and time. He has normal strength. No cranial nerve deficit or sensory deficit. He displays a negative Romberg sign. Coordination and gait normal.  Negative HINTS exam. Pt has altered sensation to light touch in bilateral UE's which is at baseline per pt.   Skin: Skin is warm and dry.  Psychiatric: He has a normal mood and affect. His behavior is normal.    ED Course  Procedures (including critical care time)  Labs Reviewed  CBC WITH DIFFERENTIAL - Abnormal; Notable for the following:    WBC 13.3 (*)    Neutro Abs 9.9 (*)    All other components within normal limits  COMPREHENSIVE METABOLIC PANEL - Abnormal; Notable for the following:    Glucose, Bld 101 (*)    GFR calc non Af Amer 66 (*)    GFR calc Af Amer 77 (*)    All other components within normal limits  ETHANOL   No results found.   1. Mild dehydration   2. Dizziness      Date: 11/04/2012  Rate: 79  Rhythm: normal sinus rhythm  QRS Axis: left  Intervals: normal  ST/T Wave abnormalities: normal  Conduction Disutrbances:none  Narrative Interpretation: Suspect artifact rather than paced rhythm. No new ST or T wave changes consistent with ischemia.   Old EKG Reviewed: changes noted    MDM  10:05 PM 67 y.o. male w hx of DM, HTN, depression who pw dizziness that began while ambulating at noon today. Pt notes he felt presyncopal, now feeling better, sx recur w/ standing. Pt AFVSS here, appears well on exam, sensory deficits from cervical spine disease are at baseline, pt is ambulatory w/out assistance. Low suspicion for CVA. Will check routine labs, ecg. IVF hydration as pt has had dec po intake d/t inability to get food/drink as he is homeless.   10:05 PM: Pt continues to appear well, labs non-contrib. Suspect mild dehydration. I have discussed the  diagnosis/risks/treatment options with the patient and believe the pt to be eligible for discharge home to follow-up with his pcp. We also discussed returning to the ED immediately if new or worsening sx occur. We discussed the sx which are most concerning (e.g., worsening dizziness) that necessitate immediate return. Any new prescriptions provided to the patient are listed below.  Discharge Medication List as of 11/04/2012  5:45 PM     Clinical Impression 1. Mild dehydration   2. Dizziness          Purvis Sheffield, MD 11/04/12 2205

## 2012-11-04 NOTE — ED Notes (Addendum)
Pt reports experiencing dizziness that began today at around 1200. Pt stated he felt lightheaded and sat down, then walked to the police station, and EMS was called from the police station. Neuro check unremarkable. Pt denies HI, SI, recent drug use.

## 2012-11-06 NOTE — ED Provider Notes (Signed)
I have supervised the resident on the management of this patient and agree with the note above. I personally interviewed and examined the patient and my addendum is below.   Vincent [name] is a 67 y.o. male hx of DM, HTN, here with dizziness. Felt presyncopal while standing up. Mildly dehydrated on exam. EKG unremarkable. Nonfocal neuro exam. Patient is homeless. Patient hydrated and felt better. D/c home.    Richardean Canal, MD 11/06/12 [phone]

## 2013-03-01 NOTE — Progress Notes (Signed)
Patient Discharge Instructions:  After Visit Summary (AVS):   Faxed to:  10/31/12 Discharge Summary Note:   Faxed to:  10/31/12 Psychiatric Admission Assessment Note:   Faxed to:  10/31/12 Suicide Risk Assessment - Discharge Assessment:   Faxed to:  10/31/12 Faxed/Sent to the Next Level Care provider:  10/31/12 Faxed to [facility] @ [phone]  Vincent [name], 03/01/2013, 12:41 PM

## 2013-03-08 IMAGING — CR DG CHEST 2V
2 series · 2 of 2 positions shown · non-contrast
Comparison: 07/30/2010

CLINICAL DATA: None of the chest pain radiating into left arm

CHEST - 2 VIEW

[view not recorded (1 of 2)]
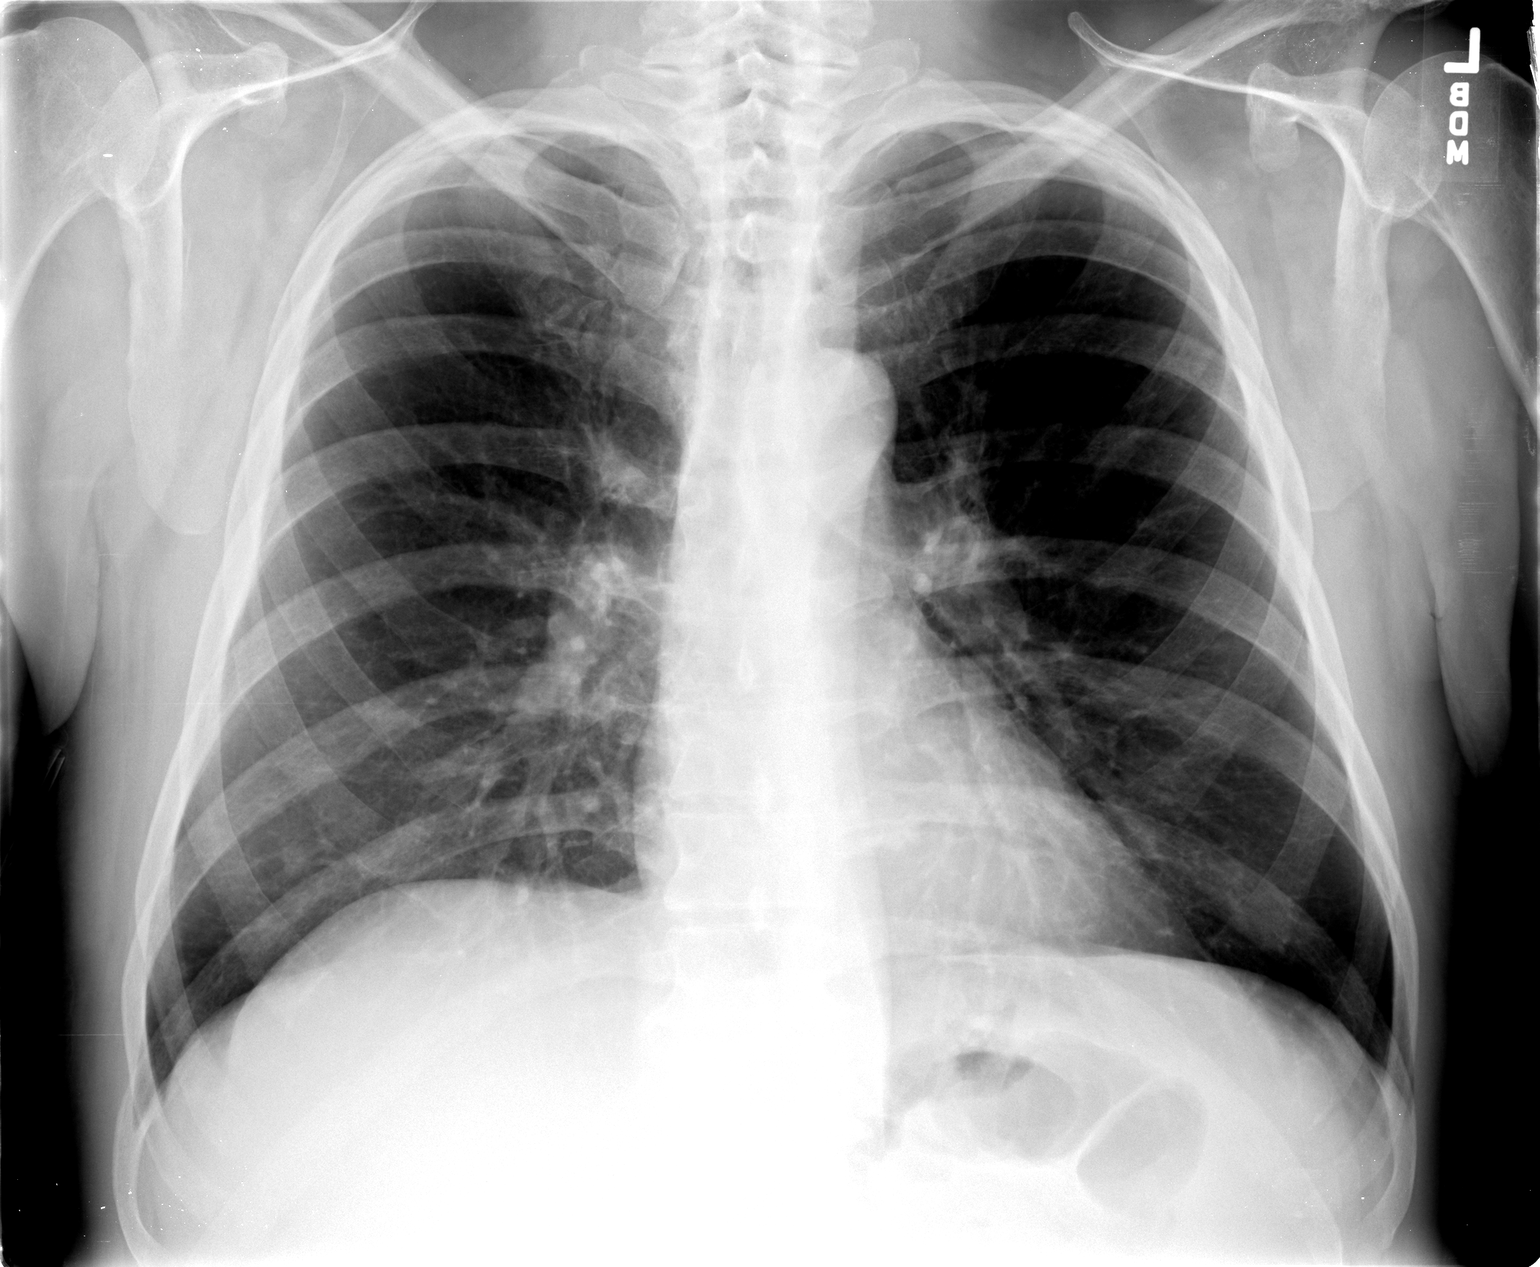

[view not recorded (2 of 2)]
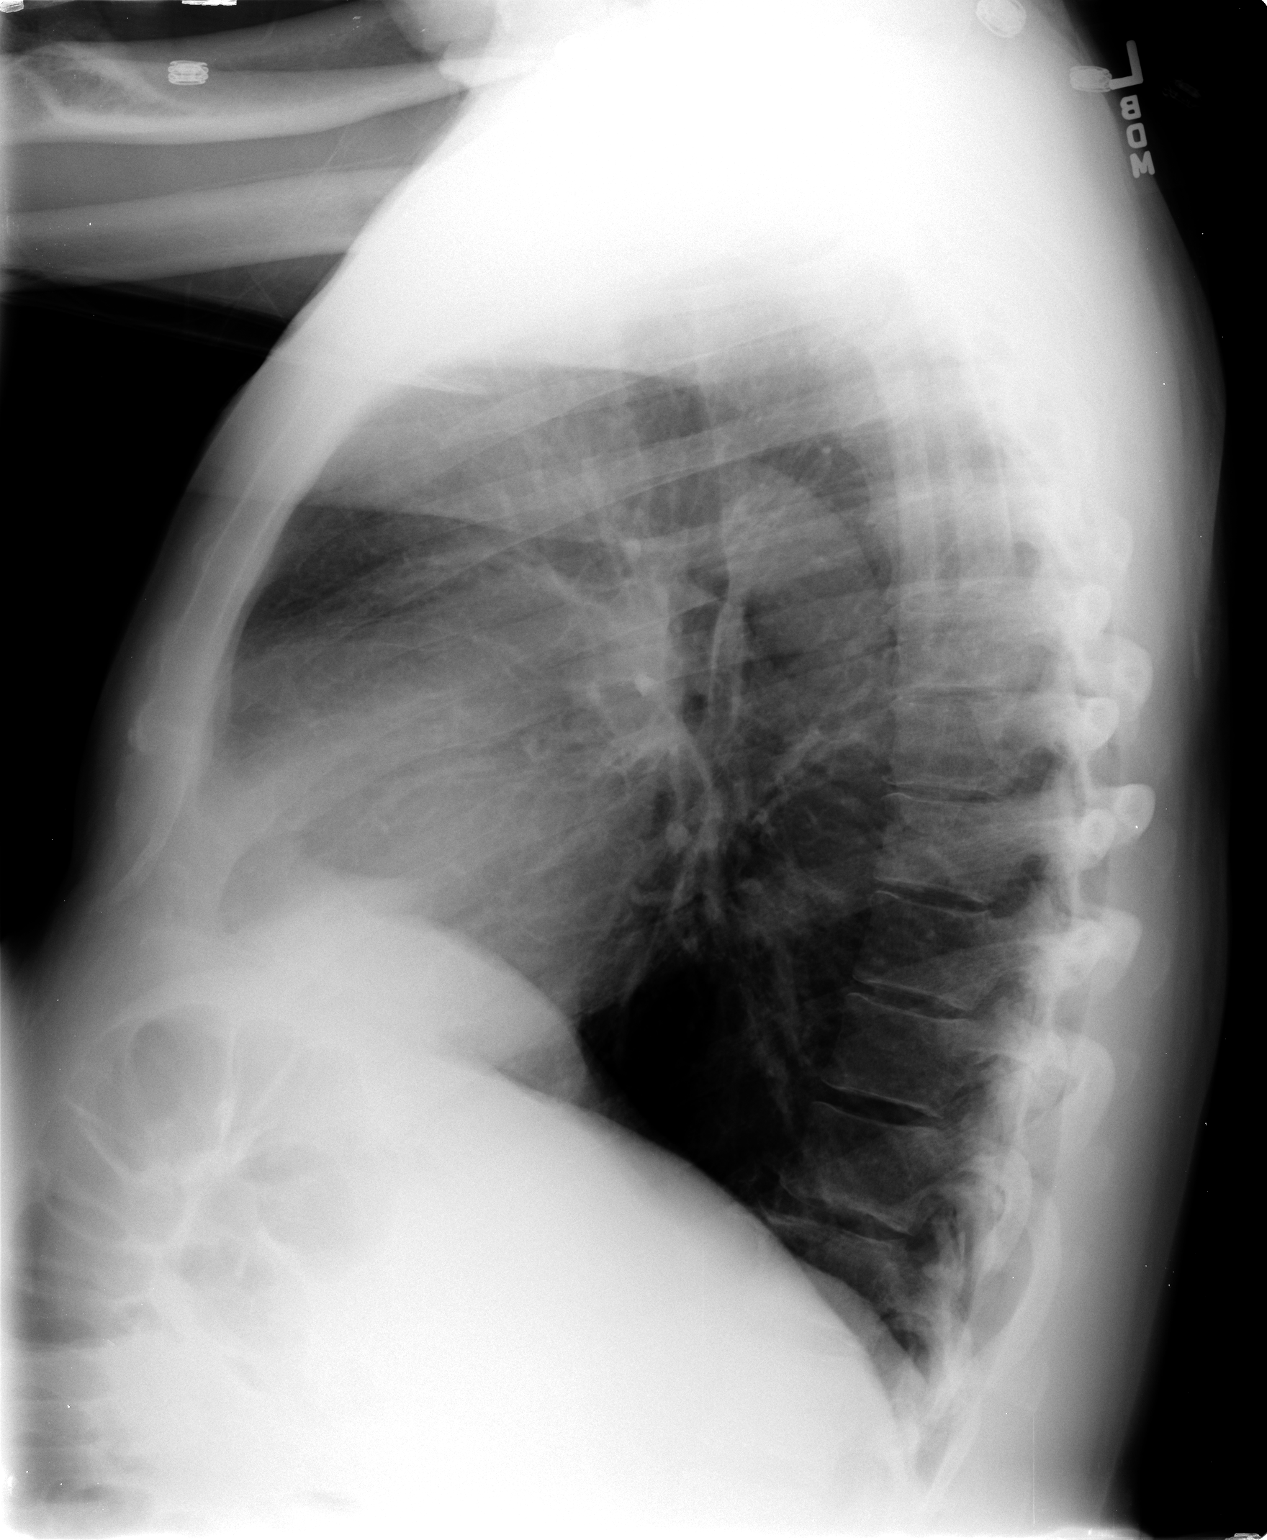

[2 of 2 positions shown; findings below may reference images not displayed]

FINDINGS: Heart and mediastinal contours normal.  Lungs clear.  No
pleural fluid.  Osseous structures and soft tissues unremarkable.
IMPRESSION: No acute or significant findings.

## 2022-07-08 ENCOUNTER — Other Ambulatory Visit: Payer: Self-pay

## 2022-07-08 ENCOUNTER — Emergency Department (HOSPITAL_COMMUNITY): Payer: No Typology Code available for payment source

## 2022-07-08 ENCOUNTER — Emergency Department (HOSPITAL_COMMUNITY)
Admission: EM | Admit: 2022-07-08 | Discharge: 2022-07-08 | Discharge status: Against Medical Advice | Payer: No Typology Code available for payment source | Source: Home / Self Care | Attending: Emergency Medicine | Admitting: Emergency Medicine

## 2022-07-08 ENCOUNTER — Encounter (HOSPITAL_COMMUNITY): Payer: Self-pay

## 2022-07-08 DIAGNOSIS — E119 Type 2 diabetes mellitus without complications: Secondary | ICD-10-CM | POA: Insufficient documentation

## 2022-07-08 DIAGNOSIS — R600 Localized edema: Secondary | ICD-10-CM | POA: Insufficient documentation

## 2022-07-08 DIAGNOSIS — Z7982 Long term (current) use of aspirin: Secondary | ICD-10-CM | POA: Insufficient documentation

## 2022-07-08 DIAGNOSIS — R262 Difficulty in walking, not elsewhere classified: Secondary | ICD-10-CM | POA: Insufficient documentation

## 2022-07-08 DIAGNOSIS — Z79899 Other long term (current) drug therapy: Secondary | ICD-10-CM | POA: Insufficient documentation

## 2022-07-08 DIAGNOSIS — I639 Cerebral infarction, unspecified: Secondary | ICD-10-CM | POA: Diagnosis not present

## 2022-07-08 DIAGNOSIS — I10 Essential (primary) hypertension: Secondary | ICD-10-CM | POA: Insufficient documentation

## 2022-07-08 DIAGNOSIS — Z7984 Long term (current) use of oral hypoglycemic drugs: Secondary | ICD-10-CM | POA: Insufficient documentation

## 2022-07-08 DIAGNOSIS — I6389 Other cerebral infarction: Secondary | ICD-10-CM | POA: Diagnosis not present

## 2022-07-08 DIAGNOSIS — R531 Weakness: Secondary | ICD-10-CM | POA: Insufficient documentation

## 2022-07-08 LAB — I-STAT CHEM 8, ED
BUN: 12 mg/dL (ref 8–23)
Calcium, Ion: 1.15 mmol/L (ref 1.15–1.40)
Chloride: 91 mmol/L — ABNORMAL LOW (ref 98–111)
Creatinine, Ser: 0.8 mg/dL (ref 0.61–1.24)
Glucose, Bld: 204 mg/dL — ABNORMAL HIGH (ref 70–99)
HCT: 51 % (ref 39.0–52.0)
Hemoglobin: 17.3 g/dL — ABNORMAL HIGH (ref 13.0–17.0)
Potassium: 4.3 mmol/L (ref 3.5–5.1)
Sodium: 131 mmol/L — ABNORMAL LOW (ref 135–145)
TCO2: 27 mmol/L (ref 22–32)

## 2022-07-08 LAB — COMPREHENSIVE METABOLIC PANEL
ALT: 29 U/L (ref 0–44)
AST: 26 U/L (ref 15–41)
Albumin: 4.2 g/dL (ref 3.5–5.0)
Alkaline Phosphatase: 69 U/L (ref 38–126)
Anion gap: 9 (ref 5–15)
BUN: 13 mg/dL (ref 8–23)
CO2: 27 mmol/L (ref 22–32)
Calcium: 9.3 mg/dL (ref 8.9–10.3)
Chloride: 94 mmol/L — ABNORMAL LOW (ref 98–111)
Creatinine, Ser: 0.78 mg/dL (ref 0.61–1.24)
GFR, Estimated: >60 mL/min (ref >60)
Glucose, Bld: 203 mg/dL — ABNORMAL HIGH (ref 70–99)
Potassium: 4.3 mmol/L (ref 3.5–5.1)
Sodium: 130 mmol/L — ABNORMAL LOW (ref 135–145)
Total Bilirubin: 0.8 mg/dL (ref 0.3–1.2)
Total Protein: 7.5 g/dL (ref 6.5–8.1)

## 2022-07-08 LAB — DIFFERENTIAL
Abs Immature Granulocytes: 0.04 10*3/uL (ref 0.00–0.07)
Basophils Absolute: 0.1 10*3/uL (ref 0.0–0.1)
Basophils Relative: 1 %
Eosinophils Absolute: 0.3 10*3/uL (ref 0.0–0.5)
Eosinophils Relative: 2 %
Immature Granulocytes: 0 %
Lymphocytes Relative: 25 %
Lymphs Abs: 3.1 10*3/uL (ref 0.7–4.0)
Monocytes Absolute: 0.9 10*3/uL (ref 0.1–1.0)
Monocytes Relative: 7 %
Neutro Abs: 7.9 10*3/uL — ABNORMAL HIGH (ref 1.7–7.7)
Neutrophils Relative %: 65 %

## 2022-07-08 LAB — URINALYSIS, ROUTINE W REFLEX MICROSCOPIC
Bilirubin Urine: NEGATIVE
Glucose, UA: NEGATIVE mg/dL
Hgb urine dipstick: NEGATIVE
Ketones, ur: NEGATIVE mg/dL
Leukocytes,Ua: NEGATIVE
Nitrite: NEGATIVE
Protein, ur: NEGATIVE mg/dL
Specific Gravity, Urine: 1.013 (ref 1.005–1.030)
pH: 8 (ref 5.0–8.0)

## 2022-07-08 LAB — CBC
HCT: 49.4 % (ref 39.0–52.0)
Hemoglobin: 17.6 g/dL — ABNORMAL HIGH (ref 13.0–17.0)
MCH: 31.9 pg (ref 26.0–34.0)
MCHC: 35.6 g/dL (ref 30.0–36.0)
MCV: 89.5 fL (ref 80.0–100.0)
Platelets: 259 10*3/uL (ref 150–400)
RBC: 5.52 MIL/uL (ref 4.22–5.81)
RDW: 12.3 % (ref 11.5–15.5)
WBC: 12.3 10*3/uL — ABNORMAL HIGH (ref 4.0–10.5)
nRBC: 0 % (ref 0.0–0.2)

## 2022-07-08 LAB — RAPID URINE DRUG SCREEN, HOSP PERFORMED
Amphetamines: NOT DETECTED
Barbiturates: NOT DETECTED
Benzodiazepines: NOT DETECTED
Cocaine: NOT DETECTED
Opiates: NOT DETECTED
Tetrahydrocannabinol: NOT DETECTED

## 2022-07-08 LAB — CBG MONITORING, ED: Glucose-Capillary: 235 mg/dL — ABNORMAL HIGH (ref 70–99)

## 2022-07-08 LAB — ETHANOL: Alcohol, Ethyl (B): <10 mg/dL (ref <10)

## 2022-07-08 LAB — PROTIME-INR
INR: 1 (ref 0.8–1.2)
Prothrombin Time: 13.1 seconds (ref 11.4–15.2)

## 2022-07-08 LAB — APTT: aPTT: 28 seconds (ref 24–36)

## 2022-07-08 MED ORDER — LISINOPRIL 10 MG PO TABS: 20.0000 mg | ORAL_TABLET | Freq: Once | ORAL | Status: DC

## 2022-07-08 MED ORDER — HYDROCHLOROTHIAZIDE 12.5 MG PO TABS: 12.5000 mg | ORAL_TABLET | Freq: Once | ORAL | Status: DC

## 2022-07-08 MED ORDER — IOHEXOL 350 MG/ML SOLN
75.0000 mL | Freq: Once | INTRAVENOUS | Status: AC | PRN
  Administered 2022-07-08: 75 mL via INTRAVENOUS

## 2022-07-08 NOTE — ED Notes (Signed)
Helped pt up to urinate, pt states that he would like to go home, states that he doesn't want further testing, md notified.

## 2022-07-08 NOTE — ED Triage Notes (Signed)
Pt back from ct, pt on with tele neuro

## 2022-07-08 NOTE — ED Triage Notes (Signed)
Pt in bed, pt states that he is here for L sided weakness, states that he was normal at 2am when he got up to urinate, md at bedside, ivs placed, blood sample obtained, pt to ct with RN

## 2022-07-08 NOTE — ED Notes (Signed)
[id number] Pt returned from CT.

## 2022-07-08 NOTE — Discharge Instructions (Signed)
You were seen in the emergency department for your weakness.  We are concerned that you may be having a stroke and recommended that you stay in the emergency department for further work-up and being admitted.  He decided to leave [place].  You are at risk for worsening stroke that could lead to permanent disability or even death.  You can return to the emergency department at any time if you would like reevaluation or would like further work-up.  You should follow-up with your primary doctor and neurology within the next 24 hours to have your symptoms rechecked.

## 2022-07-08 NOTE — ED Notes (Signed)
Sent down green top for ethanol

## 2022-07-08 NOTE — ED Notes (Signed)
Ransom activated.  Pt reports waking up at 200 am and using bathroom without difficulty and when awakening again around 0330 noted left side weakness. [id number] Pt to CT 0949 [facility] paged

## 2022-07-08 NOTE — ED Notes (Signed)
[id number] Dr Corlis Leak joins cart

## 2022-07-08 NOTE — ED Provider Notes (Signed)
Vincent [name]: [id number] Arrival date & time: 07/08/22  3810     History  Chief Complaint  Patient presents with   Weakness    Vincent [name] is a 76 y.o. male.  Patient is a 76 year old male with a past medical history of hypertension and diabetes presenting to the emergency department with left-sided weakness.  The patient states that he woke up to use the bathroom at 2 AM and felt normal and when he woke up again at 3:30 in the morning he had weakness in his left arm and left leg and felt like he was having difficulty walking.  He denies any numbness or difficulty with his speech.  He states that prior to this morning he was feeling well without any recent chest pain, fevers, nausea, vomiting or diarrhea.  The history is provided by the patient.  Weakness      Home Medications Prior to Admission medications   Medication Sig Start Date End Date Taking? Authorizing Provider  albuterol (PROVENTIL HFA;VENTOLIN HFA) 108 (90 BASE) MCG/ACT inhaler Inhale 2 puffs into the lungs every 6 (six) hours as needed for shortness of breath. For: Wheezing/shortness of breath. 10/26/12  Yes Lindell Spar I, NP  amLODipine (NORVASC) 10 MG tablet Take 10 mg by mouth daily.   Yes [provider]  aspirin EC 81 MG tablet Take 81 mg by mouth daily.   Yes [provider]  levothyroxine (SYNTHROID) 125 MCG tablet Take 125 mcg by mouth daily.   Yes [provider]  lisinopril-hydrochlorothiazide (PRINZIDE,ZESTORETIC) 20-12.5 MG per tablet Take 1 tablet by mouth daily. For high blood pressure control Patient taking differently: Take 1 tablet by mouth daily. 10/26/12  Yes Lindell Spar I, NP  metFORMIN (GLUCOPHAGE) 500 MG tablet Take 500 mg by mouth 2 (two) times daily.   Yes [provider]  Multiple Vitamins-Minerals (MULTIVITAMIN ADULTS 50+) TABS Take 1 tablet by mouth daily.   Yes [provider]       Allergies    Chocolate, Influenza vaccines, Prilosec [omeprazole], and Wellbutrin [bupropion]    Review of Systems   Review of Systems  Neurological:  Positive for weakness.    Physical Exam Updated Vital Signs BP (!) 161/91   Pulse 61   Temp 97.9 F (36.6 C) (Oral)   Resp 17   Ht 5\' 8"  (1.727 m)   Wt 74.8 kg   SpO2 91%   BMI 25.09 kg/m  Physical Exam Vitals and nursing [name] reviewed.  Constitutional:      General: He is not in acute distress.    Appearance: Normal appearance.  HENT:     Head: Normocephalic and atraumatic.     Mouth/Throat:     Mouth: Mucous membranes are moist.     Pharynx: Oropharynx is clear.  Eyes:     Extraocular Movements: Extraocular movements intact.     Conjunctiva/sclera: Conjunctivae normal.     Pupils: Pupils are equal, round, and reactive to light.  Cardiovascular:     Rate and Rhythm: Normal rate and regular rhythm.     Pulses: Normal pulses.     Heart sounds: Normal heart sounds.  Pulmonary:     Effort: Pulmonary effort is normal.     Breath sounds: Normal breath sounds.  Abdominal:     General: Abdomen is flat.     Palpations: Abdomen is soft.     Tenderness: There is no abdominal tenderness.  Musculoskeletal:  General: Normal range of motion.     Cervical back: Normal range of motion and neck supple.     Right lower leg: Edema (1+) present.     Left lower leg: Edema (1+) present.  Skin:    General: Skin is warm and dry.  Neurological:     Mental Status: He is alert and oriented to person, place, and time.     Cranial Nerves: No cranial nerve deficit.     Comments: Mild drift in left upper and left lower extremity No drift in right upper or right lower extremities Sensation intact Finger-to-nose bilaterally Speech  Psychiatric:        Mood and Affect: Mood normal.        Behavior: Behavior normal.     ED Results / Procedures / Treatments   Labs (all labs ordered are listed, but only abnormal results are  displayed) Labs Reviewed  CBC - Abnormal; Notable for the following components:      Result Value   WBC 12.3 (*)    Hemoglobin 17.6 (*)    All other components within normal limits  DIFFERENTIAL - Abnormal; Notable for the following components:   Neutro Abs 7.9 (*)    All other components within normal limits  COMPREHENSIVE METABOLIC PANEL - Abnormal; Notable for the following components:   Sodium 130 (*)    Chloride 94 (*)    Glucose, Bld 203 (*)    All other components within normal limits  URINALYSIS, ROUTINE W REFLEX MICROSCOPIC - Abnormal; Notable for the following components:   Color, Urine STRAW (*)    All other components within normal limits  CBG MONITORING, ED - Abnormal; Notable for the following components:   Glucose-Capillary 235 (*)    All other components within normal limits  I-STAT CHEM 8, ED - Abnormal; Notable for the following components:   Sodium 131 (*)    Chloride 91 (*)    Glucose, Bld 204 (*)    Hemoglobin 17.3 (*)    All other components within normal limits  PROTIME-INR  APTT  ETHANOL  RAPID URINE DRUG SCREEN, HOSP PERFORMED    EKG EKG Interpretation  Date/Time:  Thursday July 08 2022 09:32:39 EDT Ventricular Rate:  95 PR Interval:  162 QRS Duration: 92 QT Interval:  351 QTC Calculation: 439 R Axis:   261 Text Interpretation: Sinus rhythm Left anterior fascicular block Anterior infarct, old Artifact in lead(s) I II III aVR aVL aVF V1 V2 No significant change since last tracing Confirmed by Vincent Snare ([id number]) on 07/08/2022 10:49:21 AM  Radiology CT ANGIO HEAD NECK W WO CM  Result Date: 07/08/2022 CLINICAL DATA:  Left-sided weakness. EXAM: CT ANGIOGRAPHY HEAD AND NECK TECHNIQUE: Multidetector CT imaging of the head and neck was performed using the standard protocol during bolus administration of intravenous contrast. Multiplanar CT image reconstructions and MIPs were obtained to evaluate the vascular anatomy. Carotid stenosis  measurements (when applicable) are obtained utilizing NASCET criteria, using the distal internal carotid diameter as the denominator. RADIATION DOSE REDUCTION: This exam was performed according to the departmental dose-optimization program which includes automated exposure control, adjustment of the mA and/or kV according to patient size and/or use of iterative reconstruction technique. CONTRAST:  74mL OMNIPAQUE IOHEXOL 350 MG/ML SOLN COMPARISON:  None Available. FINDINGS: CTA NECK FINDINGS Aortic arch: Standard 3 vessel aortic arch with mild atherosclerotic plaque. No stenosis of the arch vessel origins. Right carotid system: Patent with a small amount of calcified and soft plaque in  the carotid bulb. No evidence of a significant stenosis or dissection. Tortuous mid cervical ICA. Left carotid system: Patent with a small amount of predominantly calcified plaque in the carotid bulb. No evidence of a significant stenosis or dissection. Tortuous mid cervical ICA. Vertebral arteries: Patent and codominant without evidence of a significant stenosis or dissection. Skeleton: C3-C7 ACDF widespread cervical facet arthrosis which is asymmetrically severe on the right. Other neck: No evidence of cervical lymphadenopathy or mass. Diminutive or absent right thyroid lobe. Upper chest: 6 mm subpleural nodule in the left upper lobe (series 5, image 19). Additional 3 mm nodule in the left upper lobe (series 5, image 26). Review of the MIP images confirms the above findings CTA HEAD FINDINGS Anterior circulation: The internal carotid arteries are patent from skull base to carotid termini with atherosclerosis resulting in mild bilateral cavernous and mild right and moderate left proximal supraclinoid stenoses. ACAs and MCAs are patent without evidence of a proximal branch occlusion or significant proximal stenosis. No aneurysm is identified. Posterior circulation: The intracranial vertebral arteries are patent to the basilar with  atherosclerotic irregularity and up to mild stenosis bilaterally. The basilar artery is patent with a mild stenosis in its midportion. There is a small left posterior communicating artery. Both PCAs are patent without evidence of a significant proximal stenosis on the right. There is a severe proximal left P2 stenosis. No aneurysm is identified. Venous sinuses: Patent. Anatomic variants: None. Review of the MIP images confirms the above findings These results were called by telephone at the time of interpretation on 07/08/2022 at 10:10 am to Dr. Elayne Snare, who verbally acknowledged these results. IMPRESSION: 1. No emergent large vessel occlusion. 2. Intracranial atherosclerosis including moderate left and mild right ICA stenoses, mild distal vertebral and basilar artery stenoses, and a severe left P2 stenosis. 3. Widely patent cervical carotid arteries. 4. Two left upper lobe pulmonary nodules measuring up to 6 mm. Per Fleischner Society Guidelines, recommend a non-contrast Chest CT at 3-6 months, then consider another non-contrast Chest CT at 18-24 months. If patient is low risk for malignancy, non-contrast Chest CT at 18-24 months is optional. These guidelines do not apply to immunocompromised patients and patients with cancer. Follow up in patients with significant comorbidities as clinically warranted. For lung cancer screening, adhere to Lung-RADS guidelines. Reference: Radiology. 2017; 284(1):228-43. 5.  Aortic Atherosclerosis (ICD10-I70.0). Electronically Signed   By: Sebastian Ache M.D.   On: 07/08/2022 10:26   CT HEAD CODE STROKE WO CONTRAST  Result Date: 07/08/2022 CLINICAL DATA:  Code stroke.  Left-sided weakness. EXAM: CT HEAD WITHOUT CONTRAST TECHNIQUE: Contiguous axial images were obtained from the base of the skull through the vertex without intravenous contrast. RADIATION DOSE REDUCTION: This exam was performed according to the departmental dose-optimization program which includes  automated exposure control, adjustment of the mA and/or kV according to patient size and/or use of iterative reconstruction technique. COMPARISON:  None Available. FINDINGS: Brain: There is no evidence of an acute infarct, intracranial hemorrhage, mass, midline shift, or extra-axial fluid collection. The ventricles and sulci are within normal limits for age. Hypodensities in the cerebral white matter bilaterally are nonspecific but compatible with mild chronic small vessel ischemic disease. Vascular: Calcified atherosclerosis at the skull base. No hyperdense vessel. Skull: No fracture. Prominent periapical lucency involving a right maxillary molar tooth. Sinuses/Orbits: Mild mucosal thickening in the paranasal sinuses. Clear mastoid air cells. Unremarkable orbits. Other: None. ASPECTS [facility] Stroke Program Early CT Score) - Ganglionic level infarction (caudate, lentiform  nuclei, internal capsule, insula, M1-M3 cortex): 7 - Supraganglionic infarction (M4-M6 cortex): 3 Total score (0-10 with 10 being normal): 10 IMPRESSION: 1. No evidence of acute intracranial abnormality. ASPECTS of 10. 2. Mild chronic small vessel ischemic disease. These results were called by telephone at the time of interpretation on 07/08/2022 at 10:10 am to Dr. Elayne Snare, who verbally acknowledged these results. Electronically Signed   By: Sebastian Ache M.D.   On: 07/08/2022 10:10    Procedures Procedures    Medications Ordered in ED Medications  lisinopril (ZESTRIL) tablet 20 mg (has no administration in time range)  hydrochlorothiazide (HYDRODIURIL) tablet 12.5 mg (has no administration in time range)  iohexol (OMNIPAQUE) 350 MG/ML injection 75 mL (75 mLs Intravenous Contrast Given 07/08/22 1002)    ED Course/ Medical Decision Making/ A&P Clinical Course as of 07/08/22 1128  Thu Jul 08, 2022  1010 I received a call from radiology that CT showed no acute infarct and no evidence of LVO. [VK]  1012 Patient was evaluated  by teleneurology with NIHSS 3, but no obvious acute stroke on CTH and no LVO. Patient is outside the window for TNK. Recommend admission for MRI brain and c-spine, start on aspirin and plavix and further stroke work up. [VK]  1124 The patient has decided to leave against medical advice.  The patient had a normal mental status examination and understands his/ her condition and the risks of leaving including worsening stroke leading to permanent disability and death. The patient has had an opportunity to ask questions about his medical condition. The patient has been informed  that he may return for care at any time and has been referred to his Physician and neurology.   [VK]    Clinical Course User Index [VK] Rexford Maus, DO                           Medical Decision Making This patient presents to the ED with chief complaint(s) of left-sided weakness with pertinent past medical history of pretension, diabetes which further complicates the presenting complaint. The complaint involves an extensive differential diagnosis and also carries with it a high risk of complications and morbidity.    The differential diagnosis includes CVA, TIA, hypo or hyperglycemia, electrolyte abnormality, anemia, arrhythmia, ICH, mass effect  Additional history obtained: Additional history obtained from N/A Records reviewed N/A  ED Course and Reassessment: Patient was made a stroke alert upon his arrival due to his acute left-sided weakness.  His last known well was 2 AM so he is outside the window for tPA.  Teleneurology was consulted for possible LVO and possible candidate for thrombectomy.  IV access was obtained.  Accu-Chek was mildly elevated glucose in the 200s and he was hypertensive with blood pressure initially in the 180s over 90s.  He had mild drift in the left upper and lower extremity and was transferred to CT to further evaluate for acute stroke.  He will additionally have labs and EKG to evaluate  for other causes of his weakness.  Independent labs interpretation:  The following labs were independently interpreted: Hyperglycemia otherwise within normal range  Independent visualization of imaging: - I independently visualized the following imaging with scope of interpretation limited to determining acute life threatening conditions related to emergency care: CTH/CTA, which revealed no acute abnormalities, no large LVO  Consultation: - Consulted or discussed management/test interpretation w/ external professional: Neurology, Hospitalist  Consideration for admission or further workup: Patient  was recommended admission for further work-up and decided to leave AGAINST MEDICAL ADVICE Social Determinants of health: N/A    Amount and/or Complexity of Data Reviewed Labs: ordered. Radiology: ordered.  Risk Prescription drug management.          Final Clinical Impression(s) / ED Diagnoses Final diagnoses:  Left-sided weakness    Rx / DC Orders ED Discharge Orders          Ordered    Ambulatory referral to Neurology       Comments: An appointment is requested in approximately: 1 week   07/08/22 1125              Vincent [name], Vincent Ericsson K, DO 07/08/22 1128

## 2022-07-08 NOTE — ED Triage Notes (Signed)
Pt to recess B md at bedside

## 2022-07-08 NOTE — ED Notes (Signed)
Patient states that he understand what a AMA form means, and is AxO x3 at this time. No other complaints at this time, patient is leaving with his brother and is willing to let him check in on him upon discharge, patient states that he will return if anything worsens.

## 2022-07-08 NOTE — Consult Note (Signed)
[facility] Tele[facility]rology Consult Services   Patient Name:   Vincent [name], Vincent [name] Date of Birth:   November 12, 1945 Identification Number:   MRN - [id number] Date of Service:   07/08/2022 09:49:01  Diagnosis:       R53.1 - Weakness       R20.2 - Paresthesia of skin  Impression:      76yoM hx of Spinal issue, COPD, DM, HTN, hypothyroidism woke up at 3:30AM with left sided weakness. CTH neg for acute finding. On exam he has very mild LUE drift, LLE unable to lift as high as RLE, and change in sensation at LLE. Not thrombolytics candidate due to LKW > 4.5hr. CTA prelim read neg for LVO.  Recommend admission for stroke workup, start on DAPT for 21 days followed by monotherapy, and permissive HTN for 24hr. Consider obtain MR cervical spine as well.  Our recommendations are outlined below.  Recommendations:        Stroke/Telemetry Floor       Neuro Checks       Bedside Swallow Eval       DVT Prophylaxis       IV Fluids, Normal Saline       Head of Bed 30 Degrees       Euglycemia and Avoid Hyperthermia (PRN Acetaminophen)       Bolus with Clopidogrel 300 mg bolus x1 and initiate dual antiplatelet therapy with Aspirin 81 mg daily and Clopidogrel 75 mg daily       Antihypertensives PRN if Blood pressure is greater than 220/120 or there is a concern for End organ damage/contraindications for permissive HTN. If blood pressure is greater than 220/120 give labetalol PO or IV or Vasotec IV with a goal of 15% reduction in BP during the first 24 hours.  Per facility request will defer further work up, management, and referrals to inpatient service, inclusive of inpatient neurology consult.  Sign Out:       Discussed with Emergency Department Provider    ------------------------------------------------------------------------------  Advanced Imaging: CTA Head and Neck Completed.  LVO:No  Patient in not a candidate for NIR   Metrics: Last Known Well: 07/08/2022  02:00:00 [facility] Notification Time: 07/08/2022 09:49:01 Arrival Time: 07/08/2022 09:19:00 Stamp Time: 07/08/2022 09:49:01 Initial Response Time: 07/08/2022 09:50:28 Symptoms: left side weakness . Initial patient interaction: 07/08/2022 09:58:12 NIHSS Assessment Completed: 07/08/2022 10:05:12 Patient is not a candidate for Thrombolytic. Thrombolytic Medical Decision: 07/08/2022 10:05:13 Patient was not deemed candidate for Thrombolytic because of following reasons: Last Well Known Above 4.5 Hours.  I personally Reviewed the CT Head and it Showed no hemorrhage  Primary Provider Notified of Diagnostic Impression and Management Plan on: 07/08/2022 10:13:23    ------------------------------------------------------------------------------  History of Present Illness: Patient is a 76 year old Male.  Patient was brought by private transportation with symptoms of left side weakness . Patient woke up at 2AM at baseline. He woke up again at 3:30AM and noticed left sided weakness and trouble getting out of bed. He has history of cervical spinal surgery, has residual mild left sided weakness even after the surgery. 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 Patient reports his baseline SBP usually in 110s-120s.   Past Medical History:      Hypertension [place] PMH:  Spinal issue, COPD, DM, HTN, hypothyroidism  Medications:  No Anticoagulant use  Antiplatelet use: Yes aspirin Reviewed EMR for current medications  Allergies:  Reviewed Description: fluzone  Social History: Smoking: Yes Alcohol Use:  Yes  Family History:  There is no family history of premature cerebrovascular disease pertinent to this consultation  ROS : 14 Points Review of Systems was performed and was negative except mentioned in HPI.  Past Surgical History: There Is No Surgical History Contributory To Today's Visit    Examination: BP(183/93), Pulse(89), Blood  Glucose(204) 1A: Level of Consciousness - Alert; keenly responsive + 0 1B: Ask Month and Age - Both Questions Right + 0 1C: Blink Eyes & Squeeze Hands - Performs Both Tasks + 0 2: Test Horizontal Extraocular Movements - Normal + 0 3: Test Visual Fields - No Visual Loss + 0 4: Test Facial Palsy (Use Grimace if Obtunded) - Normal symmetry + 0 5A: Test Left Arm Motor Drift - Drift, but doesn't hit bed + 1 5B: Test Right Arm Motor Drift - No Drift for 10 Seconds + 0 6A: Test Left Leg Motor Drift - Drift, but doesn't hit bed + 1 6B: Test Right Leg Motor Drift - No Drift for 5 Seconds + 0 7: Test Limb Ataxia (FNF/Heel-Shin) - No Ataxia + 0 8: Test Sensation - Mild-Moderate Loss: Can Sense Being Touched + 1 9: Test Language/Aphasia - Normal; No aphasia + 0 10: Test Dysarthria - Normal + 0 11: Test Extinction/Inattention - No abnormality + 0  NIHSS Score: 3  NIHSS Free Text : LLE "feels funny  LLE unable to lift as high as RLE  Very mild LUE drift  Pre-Morbid Modified Rankin Scale: 1 Points = No significant disability despite symptoms; able to carry out all usual duties and activities  Spoke with : Dr. Maylon Peppers  Patient/Family was informed the Neurology Consult would occur via TeleHealth consult by way of interactive audio and video telecommunications and consented to receiving care in this manner.   Patient is being evaluated for possible acute neurologic impairment and high probability of imminent or life-threatening deterioration. I spent total of 29 minutes providing care to this patient, including time for face to face visit via telemedicine, review of medical records, imaging studies and discussion of findings with providers, the patient and/or family.   Dr Edward Jolly   [facility] For Inpatient follow-up with [facility] physician please call [facility] [phone]. This is not an outpatient service. Post hospital discharge, please contact hospital directly.

## 2022-07-09 ENCOUNTER — Encounter (HOSPITAL_COMMUNITY): Payer: Self-pay

## 2022-07-09 ENCOUNTER — Other Ambulatory Visit: Payer: Self-pay

## 2022-07-09 ENCOUNTER — Inpatient Hospital Stay (HOSPITAL_COMMUNITY)
Admission: EM | Admit: 2022-07-09 | Discharge: 2022-07-13 | DRG: 065 | Disposition: A | Discharge status: Rehabilitation Facility | Payer: No Typology Code available for payment source | Attending: Internal Medicine | Admitting: Internal Medicine

## 2022-07-09 ENCOUNTER — Emergency Department (HOSPITAL_COMMUNITY): Payer: No Typology Code available for payment source

## 2022-07-09 ENCOUNTER — Inpatient Hospital Stay (HOSPITAL_COMMUNITY): Payer: No Typology Code available for payment source

## 2022-07-09 DIAGNOSIS — I6389 Other cerebral infarction: Principal | ICD-10-CM | POA: Diagnosis present

## 2022-07-09 DIAGNOSIS — Z720 Tobacco use: Secondary | ICD-10-CM

## 2022-07-09 DIAGNOSIS — F1020 Alcohol dependence, uncomplicated: Secondary | ICD-10-CM | POA: Diagnosis present

## 2022-07-09 DIAGNOSIS — E119 Type 2 diabetes mellitus without complications: Secondary | ICD-10-CM | POA: Diagnosis present

## 2022-07-09 DIAGNOSIS — I739 Peripheral vascular disease, unspecified: Secondary | ICD-10-CM | POA: Diagnosis present

## 2022-07-09 DIAGNOSIS — J4489 Other specified chronic obstructive pulmonary disease: Secondary | ICD-10-CM | POA: Diagnosis present

## 2022-07-09 DIAGNOSIS — I471 Supraventricular tachycardia, unspecified: Secondary | ICD-10-CM | POA: Diagnosis present

## 2022-07-09 DIAGNOSIS — Q8500 Neurofibromatosis, unspecified: Secondary | ICD-10-CM | POA: Diagnosis not present

## 2022-07-09 DIAGNOSIS — Z79899 Other long term (current) drug therapy: Secondary | ICD-10-CM | POA: Diagnosis not present

## 2022-07-09 DIAGNOSIS — F17210 Nicotine dependence, cigarettes, uncomplicated: Secondary | ICD-10-CM | POA: Diagnosis present

## 2022-07-09 DIAGNOSIS — R600 Localized edema: Secondary | ICD-10-CM

## 2022-07-09 DIAGNOSIS — Z91018 Allergy to other foods: Secondary | ICD-10-CM | POA: Diagnosis not present

## 2022-07-09 DIAGNOSIS — E785 Hyperlipidemia, unspecified: Secondary | ICD-10-CM | POA: Diagnosis present

## 2022-07-09 DIAGNOSIS — E039 Hypothyroidism, unspecified: Secondary | ICD-10-CM | POA: Diagnosis present

## 2022-07-09 DIAGNOSIS — M4712 Other spondylosis with myelopathy, cervical region: Secondary | ICD-10-CM | POA: Diagnosis present

## 2022-07-09 DIAGNOSIS — R270 Ataxia, unspecified: Secondary | ICD-10-CM | POA: Diagnosis present

## 2022-07-09 DIAGNOSIS — E871 Hypo-osmolality and hyponatremia: Secondary | ICD-10-CM

## 2022-07-09 DIAGNOSIS — E222 Syndrome of inappropriate secretion of antidiuretic hormone: Secondary | ICD-10-CM | POA: Diagnosis present

## 2022-07-09 DIAGNOSIS — Z7984 Long term (current) use of oral hypoglycemic drugs: Secondary | ICD-10-CM

## 2022-07-09 DIAGNOSIS — Y900 Blood alcohol level of less than 20 mg/100 ml: Secondary | ICD-10-CM | POA: Diagnosis present

## 2022-07-09 DIAGNOSIS — Z888 Allergy status to other drugs, medicaments and biological substances status: Secondary | ICD-10-CM

## 2022-07-09 DIAGNOSIS — F32A Depression, unspecified: Secondary | ICD-10-CM | POA: Diagnosis present

## 2022-07-09 DIAGNOSIS — I639 Cerebral infarction, unspecified: Secondary | ICD-10-CM | POA: Diagnosis present

## 2022-07-09 DIAGNOSIS — G8194 Hemiplegia, unspecified affecting left nondominant side: Secondary | ICD-10-CM | POA: Diagnosis present

## 2022-07-09 DIAGNOSIS — Z85850 Personal history of malignant neoplasm of thyroid: Secondary | ICD-10-CM

## 2022-07-09 DIAGNOSIS — R29704 NIHSS score 4: Secondary | ICD-10-CM | POA: Diagnosis present

## 2022-07-09 DIAGNOSIS — Z8673 Personal history of transient ischemic attack (TIA), and cerebral infarction without residual deficits: Secondary | ICD-10-CM | POA: Diagnosis not present

## 2022-07-09 DIAGNOSIS — I10 Essential (primary) hypertension: Secondary | ICD-10-CM | POA: Diagnosis present

## 2022-07-09 DIAGNOSIS — Z7982 Long term (current) use of aspirin: Secondary | ICD-10-CM

## 2022-07-09 DIAGNOSIS — E1169 Type 2 diabetes mellitus with other specified complication: Secondary | ICD-10-CM | POA: Diagnosis not present

## 2022-07-09 DIAGNOSIS — Z79890 Hormone replacement therapy (postmenopausal): Secondary | ICD-10-CM | POA: Diagnosis not present

## 2022-07-09 DIAGNOSIS — E669 Obesity, unspecified: Secondary | ICD-10-CM | POA: Diagnosis not present

## 2022-07-09 DIAGNOSIS — I6381 Other cerebral infarction due to occlusion or stenosis of small artery: Secondary | ICD-10-CM | POA: Diagnosis not present

## 2022-07-09 DIAGNOSIS — I69354 Hemiplegia and hemiparesis following cerebral infarction affecting left non-dominant side: Secondary | ICD-10-CM | POA: Diagnosis not present

## 2022-07-09 LAB — CBC
HCT: 46.3 % (ref 39.0–52.0)
Hemoglobin: 16.5 g/dL (ref 13.0–17.0)
MCH: 31.7 pg (ref 26.0–34.0)
MCHC: 35.6 g/dL (ref 30.0–36.0)
MCV: 89 fL (ref 80.0–100.0)
Platelets: 206 10*3/uL (ref 150–400)
RBC: 5.2 MIL/uL (ref 4.22–5.81)
RDW: 12.2 % (ref 11.5–15.5)
WBC: 12.7 10*3/uL — ABNORMAL HIGH (ref 4.0–10.5)
nRBC: 0 % (ref 0.0–0.2)

## 2022-07-09 LAB — BASIC METABOLIC PANEL
Anion gap: 8 (ref 5–15)
BUN: 13 mg/dL (ref 8–23)
CO2: 25 mmol/L (ref 22–32)
Calcium: 9.3 mg/dL (ref 8.9–10.3)
Chloride: 95 mmol/L — ABNORMAL LOW (ref 98–111)
Creatinine, Ser: 0.8 mg/dL (ref 0.61–1.24)
GFR, Estimated: >60 mL/min (ref >60)
Glucose, Bld: 177 mg/dL — ABNORMAL HIGH (ref 70–99)
Potassium: 4.1 mmol/L (ref 3.5–5.1)
Sodium: 128 mmol/L — ABNORMAL LOW (ref 135–145)

## 2022-07-09 MED ORDER — ASPIRIN 81 MG PO TBEC
81.0000 mg | DELAYED_RELEASE_TABLET | Freq: Every day | ORAL | Status: DC
  Administered 2022-07-09 – 2022-07-11 (×3): 81 mg via ORAL
  Filled 2022-07-09 (×3): qty 1

## 2022-07-09 MED ORDER — SODIUM CHLORIDE 0.9 % IV BOLUS
500.0000 mL | Freq: Once | INTRAVENOUS | Status: AC
  Administered 2022-07-09: 500 mL via INTRAVENOUS

## 2022-07-09 MED ORDER — GADOBUTROL 1 MMOL/ML IV SOLN
5.0000 mL | Freq: Once | INTRAVENOUS | Status: AC | PRN
  Administered 2022-07-09: 5 mL via INTRAVENOUS

## 2022-07-09 MED ORDER — STROKE: EARLY STAGES OF RECOVERY BOOK: Freq: Once | Status: AC

## 2022-07-09 MED ORDER — CLOPIDOGREL BISULFATE 300 MG PO TABS
300.0000 mg | ORAL_TABLET | Freq: Once | ORAL | Status: AC
  Administered 2022-07-09: 300 mg via ORAL
  Filled 2022-07-09: qty 1

## 2022-07-09 MED ORDER — CLOPIDOGREL BISULFATE 75 MG PO TABS
75.0000 mg | ORAL_TABLET | Freq: Every day | ORAL | Status: DC
  Administered 2022-07-10 – 2022-07-13 (×4): 75 mg via ORAL
  Filled 2022-07-09 (×4): qty 1

## 2022-07-09 NOTE — ED Triage Notes (Signed)
Patient was seen yesterday for a possible stroke. Patient presented yesterday with weakness of the left arm and left leg. Patient left AMA.  Patient reports today that he is having worse weakness and less movement of the left arm and left leg.

## 2022-07-09 NOTE — ED Notes (Signed)
Pt passed swallow screen

## 2022-07-09 NOTE — ED Provider Notes (Signed)
[facility]-EMERGENCY DEPT Provider Note   CSN: [id number] Arrival date & time: 07/09/22  5284     History  Chief Complaint  Patient presents with   Weakness    Vincent [name] is a 76 y.o. male.  HPI 76 year old male presents today complaining of weakness in his left side.  He was seen in the ED yesterday but left prior to completing care.  He states that he felt somewhat better at that time.  Last known normal was 2 AM on Thursday morning 1.5 hours ago.  He states he woke again around 330 or 4 in the morning at that time he had left leg weakness.  He fell due to the left leg weakness and struck his head.  Is not on any blood thinners.  He then came to the ED.  During the ED evaluation he had CT scan showed no evidence of acute abnormality, CTA with no large vessel occlusion, 2 left upper lobe nodules Labs were significant for elevated blood sugar at 204, mild hyponatremia at 131, and hemoglobin increased to17.6 Patient reports no prior stroke He has had history of some numbness in his legs secondary to back issues.    Home Medications Prior to Admission medications   Medication Sig Start Date End Date Taking? Authorizing Provider  albuterol (PROVENTIL HFA;VENTOLIN HFA) 108 (90 BASE) MCG/ACT inhaler Inhale 2 puffs into the lungs every 6 (six) hours as needed for shortness of breath. For: Wheezing/shortness of breath. 10/26/12   Armandina Stammer I, NP  amLODipine (NORVASC) 10 MG tablet Take 10 mg by mouth daily.    [provider]  aspirin EC 81 MG tablet Take 81 mg by mouth daily.    [provider]  levothyroxine (SYNTHROID) 125 MCG tablet Take 125 mcg by mouth daily.    [provider]  lisinopril-hydrochlorothiazide (PRINZIDE,ZESTORETIC) 20-12.5 MG per tablet Take 1 tablet by mouth daily. For high blood pressure control Patient taking differently: Take 1 tablet by mouth daily. 10/26/12   Armandina Stammer I, NP  metFORMIN (GLUCOPHAGE) 500 MG  tablet Take 500 mg by mouth 2 (two) times daily.    [provider]  Multiple Vitamins-Minerals (MULTIVITAMIN ADULTS 50+) TABS Take 1 tablet by mouth daily.    [provider]      Allergies    Chocolate, Influenza vaccines, Prilosec [omeprazole], and Wellbutrin [bupropion]    Review of Systems   Review of Systems  Physical Exam Updated Vital Signs BP (!) 155/76   Pulse 81   Temp 98.2 F (36.8 C) (Oral)   Resp 18   Ht 1.727 m (5\' 8" )   Wt 74.8 kg   SpO2 97%   BMI 25.09 kg/m  Physical Exam Vitals and nursing note reviewed.  Constitutional:      General: He is not in acute distress.    Appearance: Normal appearance.  HENT:     Head: Normocephalic.     Comments: Mild left periorbital contusion    Right Ear: External ear normal.     Left Ear: External ear normal.     Nose: Nose normal.     Mouth/Throat:     Mouth: Mucous membranes are moist.     Pharynx: Oropharynx is clear.  Eyes:     Extraocular Movements: Extraocular movements intact.     Pupils: Pupils are equal, round, and reactive to light.  Cardiovascular:     Rate and Rhythm: Normal rate and regular rhythm.     Pulses: Normal pulses.  Pulmonary:     Effort: Pulmonary effort is normal.     Breath sounds: Rhonchi present.  Abdominal:     General: Bowel sounds are normal.     Palpations: Abdomen is soft.  Musculoskeletal:        General: Swelling present.     Cervical back: Normal range of motion.     Comments: Bilateral lower extremity swelling with left somewhat greater than right  Skin:    General: Skin is warm and dry.     Capillary Refill: Capillary refill takes less than 2 seconds.  Neurological:     Mental Status: He is alert.     Comments: Patient with left palmar drift With decreased ability to hold the left leg up off the bed Right side is intact with normal strength Sensation is normal No facial abnormalities noted Extraocular movements intact and no visual deficits noted      ED Results / Procedures / Treatments   Labs (all labs ordered are listed, but only abnormal results are displayed) Labs Reviewed  CBC - Abnormal; Notable for the following components:      Result Value   WBC 12.7 (*)    All other components within normal limits  BASIC METABOLIC PANEL - Abnormal; Notable for the following components:   Sodium 128 (*)    Chloride 95 (*)    Glucose, Bld 177 (*)    All other components within normal limits    EKG EKG Interpretation  Date/Time:  Friday July 09 2022 09:53:02 EDT Ventricular Rate:  70 PR Interval:  160 QRS Duration: 102 QT Interval:  381 QTC Calculation: 412 R Axis:   -85 Text Interpretation: Sinus rhythm Incomplete RBBB and LAFB Consider anterior infarct Baseline wander in lead(s) V3 Confirmed by Margarita Grizzle [phone]) on 07/09/2022 12:36:55 PM  Radiology MR Brain W and Wo Contrast  Result Date: 07/09/2022 CLINICAL DATA:  Provided history: Neuro deficit, acute, stroke suspected. Weakness in left arm and left leg. EXAM: MRI HEAD WITHOUT AND WITH CONTRAST TECHNIQUE: Multiplanar, multiecho pulse sequences of the brain and surrounding structures were obtained without and with intravenous contrast. CONTRAST:  51mL GADAVIST GADOBUTROL 1 MMOL/ML IV SOLN COMPARISON:  CT angiogram head/neck 07/08/2022. Non-contrast head CT 07/08/2022. FINDINGS: Intermittently motion degraded examination, somewhat limiting evaluation. Most notably, the coronal T2 sequence is severely motion degraded. Brain: Mild generalized cerebral atrophy. 20 x 8 mm acute infarct within the right corona radiata/lentiform nucleus. Background mild multifocal T2 FLAIR hyperintense signal abnormality within the cerebral white matter, nonspecific but compatible with chronic small vessel ischemic disease. No evidence of an intracranial mass. No extra-axial fluid collection. No midline shift. No pathologic intracranial enhancement identified. Vascular: Maintained flow voids within  the proximal large arterial vessels. Skull and upper cervical spine: No focal suspicious marrow lesion. Susceptibility artifact arising from ACDF hardware. Sinuses/Orbits: No mass or acute finding within the imaged orbits. Complete T2 hyperintense opacification of the right frontal sinus. Mild mucosal thickening within the left frontal sinus. Moderate mucosal thickening within the bilateral ethmoid air cells. Mild mucosal thickening within the bilateral sphenoid and maxillary sinuses. IMPRESSION: 20 x 8 mm acute infarct within the right corona radiata/basal ganglia. Background mild chronic small vessel ischemic disease within the cerebral white matter. Mild generalized cerebral atrophy. Electronically Signed   By: Jackey Loge D.O.   On: 07/09/2022 12:10   CT ANGIO HEAD NECK W WO CM  Result Date: 07/08/2022 CLINICAL DATA:  Left-sided weakness. EXAM: CT ANGIOGRAPHY HEAD AND NECK TECHNIQUE:  Multidetector CT imaging of the head and neck was performed using the standard protocol during bolus administration of intravenous contrast. Multiplanar CT image reconstructions and MIPs were obtained to evaluate the vascular anatomy. Carotid stenosis measurements (when applicable) are obtained utilizing NASCET criteria, using the distal internal carotid diameter as the denominator. RADIATION DOSE REDUCTION: This exam was performed according to the departmental dose-optimization program which includes automated exposure control, adjustment of the mA and/or kV according to patient size and/or use of iterative reconstruction technique. CONTRAST:  16mL OMNIPAQUE IOHEXOL 350 MG/ML SOLN COMPARISON:  None Available. FINDINGS: CTA NECK FINDINGS Aortic arch: Standard 3 vessel aortic arch with mild atherosclerotic plaque. No stenosis of the arch vessel origins. Right carotid system: Patent with a small amount of calcified and soft plaque in the carotid bulb. No evidence of a significant stenosis or dissection. Tortuous mid cervical  ICA. Left carotid system: Patent with a small amount of predominantly calcified plaque in the carotid bulb. No evidence of a significant stenosis or dissection. Tortuous mid cervical ICA. Vertebral arteries: Patent and codominant without evidence of a significant stenosis or dissection. Skeleton: C3-C7 ACDF widespread cervical facet arthrosis which is asymmetrically severe on the right. Other neck: No evidence of cervical lymphadenopathy or mass. Diminutive or absent right thyroid lobe. Upper chest: 6 mm subpleural nodule in the left upper lobe (series 5, image 19). Additional 3 mm nodule in the left upper lobe (series 5, image 26). Review of the MIP images confirms the above findings CTA HEAD FINDINGS Anterior circulation: The internal carotid arteries are patent from skull base to carotid termini with atherosclerosis resulting in mild bilateral cavernous and mild right and moderate left proximal supraclinoid stenoses. ACAs and MCAs are patent without evidence of a proximal branch occlusion or significant proximal stenosis. No aneurysm is identified. Posterior circulation: The intracranial vertebral arteries are patent to the basilar with atherosclerotic irregularity and up to mild stenosis bilaterally. The basilar artery is patent with a mild stenosis in its midportion. There is a small left posterior communicating artery. Both PCAs are patent without evidence of a significant proximal stenosis on the right. There is a severe proximal left P2 stenosis. No aneurysm is identified. Venous sinuses: Patent. Anatomic variants: None. Review of the MIP images confirms the above findings These results were called by telephone at the time of interpretation on 07/08/2022 at 10:10 am to Dr. Leanord Asal, who verbally acknowledged these results. IMPRESSION: 1. No emergent large vessel occlusion. 2. Intracranial atherosclerosis including moderate left and mild right ICA stenoses, mild distal vertebral and basilar artery  stenoses, and a severe left P2 stenosis. 3. Widely patent cervical carotid arteries. 4. Two left upper lobe pulmonary nodules measuring up to 6 mm. Per Fleischner Society Guidelines, recommend a non-contrast Chest CT at 3-6 months, then consider another non-contrast Chest CT at 18-24 months. If patient is low risk for malignancy, non-contrast Chest CT at 18-24 months is optional. These guidelines do not apply to immunocompromised patients and patients with cancer. Follow up in patients with significant comorbidities as clinically warranted. For lung cancer screening, adhere to Lung-RADS guidelines. Reference: Radiology. 2017; 284(1):228-43. 5.  Aortic Atherosclerosis (ICD10-I70.0). Electronically Signed   By: Logan Bores M.D.   On: 07/08/2022 10:26   CT HEAD CODE STROKE WO CONTRAST  Result Date: 07/08/2022 CLINICAL DATA:  Code stroke.  Left-sided weakness. EXAM: CT HEAD WITHOUT CONTRAST TECHNIQUE: Contiguous axial images were obtained from the base of the skull through the vertex without intravenous contrast. RADIATION DOSE  REDUCTION: This exam was performed according to the departmental dose-optimization program which includes automated exposure control, adjustment of the mA and/or kV according to patient size and/or use of iterative reconstruction technique. COMPARISON:  None Available. FINDINGS: Brain: There is no evidence of an acute infarct, intracranial hemorrhage, mass, midline shift, or extra-axial fluid collection. The ventricles and sulci are within normal limits for age. Hypodensities in the cerebral white matter bilaterally are nonspecific but compatible with mild chronic small vessel ischemic disease. Vascular: Calcified atherosclerosis at the skull base. No hyperdense vessel. Skull: No fracture. Prominent periapical lucency involving a right maxillary molar tooth. Sinuses/Orbits: Mild mucosal thickening in the paranasal sinuses. Clear mastoid air cells. Unremarkable orbits. Other: None. ASPECTS  [facility] Stroke Program Early CT Score) - Ganglionic level infarction (caudate, lentiform nuclei, internal capsule, insula, M1-M3 cortex): 7 - Supraganglionic infarction (M4-M6 cortex): 3 Total score (0-10 with 10 being normal): 10 IMPRESSION: 1. No evidence of acute intracranial abnormality. ASPECTS of 10. 2. Mild chronic small vessel ischemic disease. These results were called by telephone at the time of interpretation on 07/08/2022 at 10:10 am to Dr. Elayne Snare, who verbally acknowledged these results. Electronically Signed   By: Sebastian Ache M.D.   On: 07/08/2022 10:10    Procedures .Critical Care  Performed by: Margarita Grizzle, MD Authorized by: Margarita Grizzle, MD   Critical care provider statement:    Critical care time (minutes):  60   Critical care was necessary to treat or prevent imminent or life-threatening deterioration of the following conditions:  CNS failure or compromise   Critical care was time spent personally by me on the following activities:  Development of treatment plan with patient or surrogate, discussions with consultants, evaluation of patient's response to treatment, examination of patient, ordering and review of laboratory studies, ordering and review of radiographic studies, ordering and performing treatments and interventions, pulse oximetry, re-evaluation of patient's condition and review of old charts     Medications Ordered in ED Medications  sodium chloride 0.9 % bolus 500 mL (0 mLs Intravenous Stopped 07/09/22 1121)  gadobutrol (GADAVIST) 1 MMOL/ML injection 5 mL (5 mLs Intravenous Contrast Given 07/09/22 1151)    ED Course/ Medical Decision Making/ A&P Clinical Course as of 07/09/22 1241  Fri Jul 09, 2022  1158 MRI reviewed [DR]    Clinical Course User Index [DR] Margarita Grizzle, MD                           Medical Decision Making Patient greater than 24 hours since onset of left-sided weakness.  Patient was seen and evaluated in the ED yesterday.   He left prior to completion of care due to chaotic environment and he thought that he was feeling better.  Never, in the interim he has had increasing left-sided weakness and is unable to get around on his own today.  He presents again for evaluation of left-sided weakness .  Prior CT from yesterday reviewed no large vessel occlusion noted. Patient had some labs obtained yesterday with mild hyperglycemia He will have labs redrawn.  Plan MRI and admission for ongoing evaluation and treatment of likely stroke Discussed with Dr. Ronaldo Miyamoto Discussed with DR. Bhagat, on call for neurology 1- stroke 2- hyponatremia 3- hyperglycemia 4- hypertension Plan transfer to [facility] I discussed findings with the patient and his brother.  We discussed the upcoming hospitalization.  They are in agreement with plan  Amount and/or Complexity of Data Reviewed External Data  Reviewed: labs, radiology and notes. Labs: ordered. Decision-making details documented in ED Course. Radiology: ordered and independent interpretation performed. Decision-making details documented in ED Course. ECG/medicine tests: ordered and independent interpretation performed. Decision-making details documented in ED Course.  Risk Prescription drug management. Decision regarding hospitalization.           Final Clinical Impression(s) / ED Diagnoses Final diagnoses:  Cerebrovascular accident (CVA), unspecified mechanism (HCC)    Rx / DC Orders ED Discharge Orders     None         Margarita Grizzleay, Cassady Stanczak, MD 07/09/22 1241
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
## 2022-07-09 NOTE — H&P (Signed)
History and Physical    Patient: Vincent [name] [id number] DOB: Mar 27, 1946 DOA: 07/09/2022 DOS: the patient was seen and examined on 07/09/2022 PCP: [facility]  Patient coming from: Home  Chief Complaint:  Chief Complaint  Patient presents with   Weakness   HPI: Vincent [name] is a 76 y.o. male with medical history significant of HTN, DM2, hypothyroidism. Presenting with left leg weakness. He was in his normal state of health until yesterday morning around 0330hrs. He reports that he had some weakness in his left arm and leg. He didn't have any CP, palpitations, or syncope. He became concerned and came to the ED for evaluation. After being notified he would need to be admitted for CVA w/u, the patient declined and left AMA. He says he was fine at home until early this morning, when he could no longer move his left left. He became concerned again and came back to the ED for evaluation. He denies any other aggravating or alleviating factors.    Review of Systems: As mentioned in the history of present illness. All other systems reviewed and are negative. Past Medical History:  Diagnosis Date   Asthma    Depression    Diabetes mellitus    Hypertension    Numbness    Stroke [facility])    Suicide and self-inflicted injury ([place]) Aug, 2012   cut wrist   Past Surgical History:  Procedure Laterality Date   APPENDECTOMY     SHOULDER ARTHROSCOPY     TONSILLECTOMY     Social History:  reports that he has been smoking cigarettes. He has a 50.00 pack-year smoking history. He has never used smokeless tobacco. He reports current alcohol use of about 8.0 standard drinks of alcohol per week. He reports that he does not use drugs.  Allergies  Allergen Reactions   Chocolate Swelling and Other (See Comments)    [facility]otension [facility] reported   Influenza Vaccines Hives    [facility] reports, allergy to all influenza vaccines   Prilosec [Omeprazole] Diarrhea    [facility] reported   Wellbutrin [Bupropion]  Hypertension    [facility] reported    History reviewed. No pertinent family history.  Prior to Admission medications   Medication Sig Start Date End Date Taking? Authorizing Provider  albuterol (PROVENTIL HFA;VENTOLIN HFA) 108 (90 BASE) MCG/ACT inhaler Inhale 2 puffs into the lungs every 6 (six) hours as needed for shortness of breath. For: Wheezing/shortness of breath. 10/26/12   Lindell Spar I, NP  amLODipine (NORVASC) 10 MG tablet Take 10 mg by mouth daily.    [provider]  aspirin EC 81 MG tablet Take 81 mg by mouth daily.    [provider]  levothyroxine (SYNTHROID) 125 MCG tablet Take 125 mcg by mouth daily.    [provider]  lisinopril-hydrochlorothiazide (PRINZIDE,ZESTORETIC) 20-12.5 MG per tablet Take 1 tablet by mouth daily. For high blood pressure control Patient taking differently: Take 1 tablet by mouth daily. 10/26/12   Lindell Spar I, NP  metFORMIN (GLUCOPHAGE) 500 MG tablet Take 500 mg by mouth 2 (two) times daily.    [provider]  Multiple Vitamins-Minerals (MULTIVITAMIN ADULTS 50+) TABS Take 1 tablet by mouth daily.    [provider]    Physical Exam: Vitals:   07/09/22 1000 07/09/22 1030 07/09/22 1045 07/09/22 1215  BP: (!) 150/77 (!) 161/85 (!) 164/83 (!) 155/76  Pulse: 71 70 72 81  Resp: 15 18 20 18   Temp:      TempSrc:  SpO2: 94% 95% 96% 97%  Weight:      Height:       General: 76 y.o. male resting in bed in NAD Eyes: PERRL, normal sclera ENMT: Nares patent w/o discharge, orophaynx poor dentition normal, ears w/o discharge/lesions/ulcers Neck: Supple, trachea midline Cardiovascular: RRR, +S1, S2, no m/g/r, equal pulses throughout Respiratory: scattered exp wheeze, normal WOB GI: BS+, NDNT, no masses noted, no organomegaly noted MSK: No c/c; chronic venous changes BLE; however, he has greater edema in the LLE Neuro: A&O x 3, LUE msk str 4-5/5; LLE msk str 4/5 Psyc: Appropriate interaction and affect,  calm/cooperative  Data Reviewed:  Results for orders placed or performed during the hospital encounter of 07/09/22 (from the past 24 hour(s))  CBC     Status: Abnormal   Collection Time: 07/09/22  9:35 AM  Result Value Ref Range   WBC 12.7 (H) 4.0 - 10.5 K/uL   RBC 5.20 4.22 - 5.81 MIL/uL   Hemoglobin 16.5 13.0 - 17.0 g/dL   HCT 46.3 39.0 - 52.0 %   MCV 89.0 80.0 - 100.0 fL   MCH 31.7 26.0 - 34.0 pg   MCHC 35.6 30.0 - 36.0 g/dL   RDW 12.2 11.5 - 15.5 %   Platelets 206 150 - 400 K/uL   nRBC 0.0 0.0 - 0.2 %  Basic metabolic panel     Status: Abnormal   Collection Time: 07/09/22  9:35 AM  Result Value Ref Range   Sodium 128 (L) 135 - 145 mmol/L   Potassium 4.1 3.5 - 5.1 mmol/L   Chloride 95 (L) 98 - 111 mmol/L   CO2 25 22 - 32 mmol/L   Glucose, Bld 177 (H) 70 - 99 mg/dL   BUN 13 8 - 23 mg/dL   Creatinine, Ser 0.80 0.61 - 1.24 mg/dL   Calcium 9.3 8.9 - 10.3 mg/dL   GFR, Estimated >60 >60 mL/min   Anion gap 8 5 - 15   CTA H&N 1. No emergent large vessel occlusion. 2. Intracranial atherosclerosis including moderate left and mild right ICA stenoses, mild distal vertebral and basilar artery stenoses, and a severe left P2 stenosis. 3. Widely patent cervical carotid arteries. 4. Two left upper lobe pulmonary nodules measuring up to 6 mm. Per Fleischner Society Guidelines, recommend a non-contrast Chest CT at 3-6 months, then consider another non-contrast Chest CT at 18-24 months. If patient is low risk for malignancy, non-contrast Chest CT at 18-24 months is optional. These guidelines do not apply to immunocompromised patients and patients with cancer. Follow up in patients with significant comorbidities as clinically warranted. For lung cancer screening, adhere to Lung-RADS guidelines. Reference: Radiology. 2017; 284(1):228-43. 5.  Aortic Atherosclerosis (ICD10-I70.0).  MRI Brain: 20 x 8 mm acute infarct within the right corona radiata/basal ganglia. Background mild  chronic small vessel ischemic disease within the cerebral white matter. Mild generalized cerebral atrophy.  Assessment and Plan: Acute CVA     - admit to inpt, tele @ [place]     - NPO until swallow screen     - ASA, statin     - neuro consulted     - check echo, defer need for further imaging to neuro     - permissive HTN     - PT/OT/SLP     - A1c, lipid panel  LLE edema     - check d-dimer, venous doppler  DM2     - A1c, SSI, glucose checks  HTN     -  permissive HTN for now  Tobacco abuse     - nicotine patch     - counsel against further use  EtOH abuse     - CIWA     - counsel against further use  Hypothyroidism     - continue home regimen after swallow screen  Hyponatremia     - check urine Na+, urine osm     - fluids     - q6h renal function panel; limit Na+ rise to 8-10 pts/day  Advance Care Planning:   Code Status: FULL  Consults: Neurology  Family Communication: w/ brother at bedside  Severity of Illness: The appropriate patient status for this patient is INPATIENT. Inpatient status is judged to be reasonable and necessary in order to provide the required intensity of service to ensure the patient's safety. The patient's presenting symptoms, physical exam findings, and initial radiographic and laboratory data in the context of their chronic comorbidities is felt to place them at high risk for further clinical deterioration. Furthermore, it is not anticipated that the patient will be medically stable for discharge from the hospital within 2 midnights of admission.   * I certify that at the point of admission it is my clinical judgment that the patient will require inpatient hospital care spanning beyond 2 midnights from the point of admission due to high intensity of service, high risk for further deterioration and high frequency of surveillance required.*  Author: Jonnie Finner, DO 07/09/2022 12:39 PM  For on call review [email or web address].

## 2022-07-09 NOTE — Plan of Care (Signed)
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 No emergent large vessel occlusion. 2. Intracranial atherosclerosis including moderate left and mild right ICA stenoses, mild distal vertebral and basilar artery stenoses, and a severe left P2 stenosis. 3. Widely patent cervical carotid arteries. 4. Two left upper lobe pulmonary nodules measuring up to 6 mm. Per Fleischner Society Guidelines, recommend a non-contrast Chest CT at 3-6 months, then consider another non-contrast Chest CT at 18-24 months. If patient is low risk for malignancy, non-contrast Chest CT at 18-24 months is optional. These guidelines do not apply to immunocompromised patients and patients with cancer. Follow up in patients with significant comorbidities as clinically warranted. For lung cancer screening, adhere to Lung-RADS guidelines. Reference: Radiology. 2017; 284(1):228-43. 5.  Aortic Atherosclerosis (ICD10-I70.0).  MRI brain 10/13 20 x 8 mm acute infarct within the right corona radiata/basal ganglia.  Background mild chronic small vessel ischemic disease within the cerebral white matter. Mild generalized cerebral atrophy.    Basic Metabolic Panel: Recent Labs  Lab 07/08/22 0930 07/08/22 0944 07/09/22 0935  NA 130* 131* 128*  K 4.3 4.3 4.1  CL 94* 91* 95*  CO2 27  --  25  GLUCOSE 203* 204* 177*  BUN 13 12 13   CREATININE 0.78 0.80 0.80  CALCIUM 9.3  --  9.3    CBC: Recent Labs  Lab 07/08/22 0930  07/08/22 0944 07/09/22 0935  WBC 12.3*  --  12.7*  NEUTROABS 7.9*  --   --   HGB 17.6* 17.3* 16.5  HCT 49.4 51.0 46.3  MCV 89.5  --  89.0  PLT 259  --  206    Coagulation Studies: Recent Labs    07/08/22 0930  LABPROT 13.1  INR 1.0     #Right corona radiata stroke, likely secondary to small vessel disease - Patient should be admitted to [facility] for stroke follow-up - HgbA1c, fasting lipid panel - CTA head and neck already completed on 10/12 - Frequent neuro checks - Echocardiogram - Aspirin 81 mg daily as well as Plavix 300 mg load if he did not receive the loading dose yesterday with 75 mg daily for 21 - 90 day (course to be determined after full consultation) if no significant bleeding contraindications on review of systems and no urgent procedures planned - Risk factor modification - Telemetry monitoring;  - Blood pressure goal  - Permissive hypertension to 220/120 due to acute stroke for 24 to 48 hours - PT consult, OT consult, Speech consult - Stroke team to follow in consultation  Work-up of hyponatremia per primary team, avoid correction by greater than 8 mEq in 24 hours Consider high-dose thiamine if patient is continuing to have heavy alcohol use, with level checked prior to supplementation  These are preliminary recommendations based on a brief review of the chart

## 2022-07-10 ENCOUNTER — Inpatient Hospital Stay (HOSPITAL_COMMUNITY): Payer: No Typology Code available for payment source

## 2022-07-10 DIAGNOSIS — I6389 Other cerebral infarction: Secondary | ICD-10-CM

## 2022-07-10 DIAGNOSIS — E785 Hyperlipidemia, unspecified: Secondary | ICD-10-CM

## 2022-07-10 DIAGNOSIS — I639 Cerebral infarction, unspecified: Secondary | ICD-10-CM

## 2022-07-10 LAB — LIPID PANEL
Cholesterol: 170 mg/dL (ref 0–200)
HDL: 40 mg/dL — ABNORMAL LOW (ref >40)
LDL Cholesterol: 109 mg/dL — ABNORMAL HIGH (ref 0–99)
Total CHOL/HDL Ratio: 4.3 RATIO
Triglycerides: 104 mg/dL (ref <150)
VLDL: 21 mg/dL (ref 0–40)

## 2022-07-10 LAB — RENAL FUNCTION PANEL
Albumin: 3.8 g/dL (ref 3.5–5.0)
Albumin: 3.6 g/dL (ref 3.5–5.0)
Albumin: 4 g/dL (ref 3.5–5.0)
Anion gap: 10 (ref 5–15)
Anion gap: 3 — ABNORMAL LOW (ref 5–15)
Anion gap: 13 (ref 5–15)
BUN: 11 mg/dL (ref 8–23)
BUN: 12 mg/dL (ref 8–23)
BUN: 14 mg/dL (ref 8–23)
CO2: 25 mmol/L (ref 22–32)
CO2: 31 mmol/L (ref 22–32)
CO2: 23 mmol/L (ref 22–32)
Calcium: 8.8 mg/dL — ABNORMAL LOW (ref 8.9–10.3)
Calcium: 8.7 mg/dL — ABNORMAL LOW (ref 8.9–10.3)
Calcium: 9.3 mg/dL (ref 8.9–10.3)
Chloride: 92 mmol/L — ABNORMAL LOW (ref 98–111)
Chloride: 95 mmol/L — ABNORMAL LOW (ref 98–111)
Chloride: 93 mmol/L — ABNORMAL LOW (ref 98–111)
Creatinine, Ser: 0.81 mg/dL (ref 0.61–1.24)
Creatinine, Ser: 0.93 mg/dL (ref 0.61–1.24)
Creatinine, Ser: 0.9 mg/dL (ref 0.61–1.24)
GFR, Estimated: >60 mL/min (ref >60)
GFR, Estimated: >60 mL/min (ref >60)
GFR, Estimated: >60 mL/min (ref >60)
Glucose, Bld: 163 mg/dL — ABNORMAL HIGH (ref 70–99)
Glucose, Bld: 149 mg/dL — ABNORMAL HIGH (ref 70–99)
Glucose, Bld: 137 mg/dL — ABNORMAL HIGH (ref 70–99)
Phosphorus: 3 mg/dL (ref 2.5–4.6)
Phosphorus: 2.9 mg/dL (ref 2.5–4.6)
Phosphorus: 2.8 mg/dL (ref 2.5–4.6)
Potassium: 3.7 mmol/L (ref 3.5–5.1)
Potassium: 3.8 mmol/L (ref 3.5–5.1)
Potassium: 3.7 mmol/L (ref 3.5–5.1)
Sodium: 127 mmol/L — ABNORMAL LOW (ref 135–145)
Sodium: 129 mmol/L — ABNORMAL LOW (ref 135–145)
Sodium: 129 mmol/L — ABNORMAL LOW (ref 135–145)

## 2022-07-10 LAB — CBC WITH DIFFERENTIAL/PLATELET
Abs Immature Granulocytes: 0.06 10*3/uL (ref 0.00–0.07)
Basophils Absolute: 0.1 10*3/uL (ref 0.0–0.1)
Basophils Relative: 1 %
Eosinophils Absolute: 0.3 10*3/uL (ref 0.0–0.5)
Eosinophils Relative: 2 %
HCT: 43.6 % (ref 39.0–52.0)
Hemoglobin: 16.2 g/dL (ref 13.0–17.0)
Immature Granulocytes: 1 %
Lymphocytes Relative: 19 %
Lymphs Abs: 2.5 10*3/uL (ref 0.7–4.0)
MCH: 32.5 pg (ref 26.0–34.0)
MCHC: 37.2 g/dL — ABNORMAL HIGH (ref 30.0–36.0)
MCV: 87.6 fL (ref 80.0–100.0)
Monocytes Absolute: 0.9 10*3/uL (ref 0.1–1.0)
Monocytes Relative: 7 %
Neutro Abs: 9.3 10*3/uL — ABNORMAL HIGH (ref 1.7–7.7)
Neutrophils Relative %: 70 %
Platelets: 207 10*3/uL (ref 150–400)
RBC: 4.98 MIL/uL (ref 4.22–5.81)
RDW: 12 % (ref 11.5–15.5)
WBC: 13.1 10*3/uL — ABNORMAL HIGH (ref 4.0–10.5)
nRBC: 0 % (ref 0.0–0.2)

## 2022-07-10 LAB — OSMOLALITY, URINE: Osmolality, Ur: 687 mOsm/kg (ref 300–900)

## 2022-07-10 LAB — ECHOCARDIOGRAM COMPLETE
AR max vel: 4.02 cm2
AV Area VTI: 3.36 cm2
AV Area mean vel: 3.62 cm2
AV Mean grad: 5 mmHg
AV Peak grad: 7.8 mmHg
Ao pk vel: 1.4 m/s
Area-P 1/2: 2.69 cm2
Height: 68 in
S' Lateral: 2.7 cm
Weight: 2761.92 oz

## 2022-07-10 LAB — HEMOGLOBIN A1C
Hgb A1c MFr Bld: 6.8 % — ABNORMAL HIGH (ref 4.8–5.6)
Mean Plasma Glucose: 148.46 mg/dL

## 2022-07-10 LAB — GLUCOSE, CAPILLARY: Glucose-Capillary: 185 mg/dL — ABNORMAL HIGH (ref 70–99)

## 2022-07-10 LAB — SODIUM, URINE, RANDOM: Sodium, Ur: 159 mmol/L

## 2022-07-10 LAB — D-DIMER, QUANTITATIVE: D-Dimer, Quant: 0.95 ug/mL-FEU — ABNORMAL HIGH (ref 0.00–0.50)

## 2022-07-10 MED ORDER — FOLIC ACID 1 MG PO TABS
1.0000 mg | ORAL_TABLET | Freq: Every day | ORAL | Status: DC
  Administered 2022-07-10 – 2022-07-13 (×4): 1 mg via ORAL
  Filled 2022-07-10 (×4): qty 1

## 2022-07-10 MED ORDER — THIAMINE HCL 100 MG/ML IJ SOLN
100.0000 mg | Freq: Every day | INTRAMUSCULAR | Status: DC
  Filled 2022-07-10: qty 2

## 2022-07-10 MED ORDER — ACETAMINOPHEN 160 MG/5ML PO SOLN: 650.0000 mg | ORAL | Status: DC | PRN

## 2022-07-10 MED ORDER — ALBUTEROL SULFATE (2.5 MG/3ML) 0.083% IN NEBU
3.0000 mL | INHALATION_SOLUTION | Freq: Four times a day (QID) | RESPIRATORY_TRACT | Status: DC | PRN
  Filled 2022-07-10: qty 3

## 2022-07-10 MED ORDER — THIAMINE MONONITRATE 100 MG PO TABS
100.0000 mg | ORAL_TABLET | Freq: Every day | ORAL | Status: DC
  Administered 2022-07-10 – 2022-07-13 (×4): 100 mg via ORAL
  Filled 2022-07-10 (×4): qty 1

## 2022-07-10 MED ORDER — ACETAMINOPHEN 650 MG RE SUPP: 650.0000 mg | RECTAL | Status: DC | PRN

## 2022-07-10 MED ORDER — ATORVASTATIN CALCIUM 40 MG PO TABS
40.0000 mg | ORAL_TABLET | Freq: Every day | ORAL | Status: DC
  Administered 2022-07-11 – 2022-07-13 (×3): 40 mg via ORAL
  Filled 2022-07-10 (×3): qty 1

## 2022-07-10 MED ORDER — ASPIRIN 300 MG RE SUPP: 300.0000 mg | Freq: Every day | RECTAL | Status: DC

## 2022-07-10 MED ORDER — LORAZEPAM 2 MG/ML IJ SOLN: 1.0000 mg | INTRAMUSCULAR | Status: AC | PRN

## 2022-07-10 MED ORDER — NICOTINE 21 MG/24HR TD PT24
21.0000 mg | MEDICATED_PATCH | Freq: Every day | TRANSDERMAL | Status: DC
  Administered 2022-07-12: 21 mg via TRANSDERMAL
  Filled 2022-07-10 (×3): qty 1

## 2022-07-10 MED ORDER — STROKE: EARLY STAGES OF RECOVERY BOOK
Freq: Once | Status: AC
  Filled 2022-07-10: qty 1

## 2022-07-10 MED ORDER — ENOXAPARIN SODIUM 40 MG/0.4ML IJ SOSY
40.0000 mg | PREFILLED_SYRINGE | INTRAMUSCULAR | Status: DC
  Administered 2022-07-10 – 2022-07-12 (×3): 40 mg via SUBCUTANEOUS
  Filled 2022-07-10 (×3): qty 0.4

## 2022-07-10 MED ORDER — ACETAMINOPHEN 325 MG PO TABS: 650.0000 mg | ORAL_TABLET | ORAL | Status: DC | PRN

## 2022-07-10 MED ORDER — ATORVASTATIN CALCIUM 80 MG PO TABS
80.0000 mg | ORAL_TABLET | Freq: Every day | ORAL | Status: DC
  Administered 2022-07-10: 80 mg via ORAL
  Filled 2022-07-10: qty 1

## 2022-07-10 MED ORDER — ASPIRIN 325 MG PO TABS: 325.0000 mg | ORAL_TABLET | Freq: Every day | ORAL | Status: DC

## 2022-07-10 MED ORDER — LORAZEPAM 1 MG PO TABS: 1.0000 mg | ORAL_TABLET | ORAL | Status: AC | PRN

## 2022-07-10 MED ORDER — SENNOSIDES-DOCUSATE SODIUM 8.6-50 MG PO TABS: 1.0000 | ORAL_TABLET | Freq: Every evening | ORAL | Status: DC | PRN

## 2022-07-10 MED ORDER — ADULT MULTIVITAMIN W/MINERALS CH
1.0000 | ORAL_TABLET | Freq: Every day | ORAL | Status: DC
  Administered 2022-07-10 – 2022-07-13 (×4): 1 via ORAL
  Filled 2022-07-10 (×4): qty 1

## 2022-07-10 MED ORDER — LEVOTHYROXINE SODIUM 25 MCG PO TABS
125.0000 ug | ORAL_TABLET | Freq: Every day | ORAL | Status: DC
  Administered 2022-07-10 – 2022-07-13 (×4): 125 ug via ORAL
  Filled 2022-07-10 (×4): qty 1

## 2022-07-10 NOTE — Progress Notes (Signed)
Inpatient Rehab Admissions Coordinator:  ? ?Per therapy recommendations,  patient was screened for CIR candidacy by Avah Bashor, MS, CCC-SLP. At this time, Pt. Appears to be a a potential candidate for CIR. I will place   order for rehab consult per protocol for full assessment. Please contact me any with questions. ? ?Jordanne Elsbury, MS, CCC-SLP ?Rehab Admissions Coordinator  ?[phone] (celll) ?[phone] (office) ? ?

## 2022-07-10 NOTE — Progress Notes (Signed)
STROKE TEAM PROGRESS NOTE   SUBJECTIVE (INTERVAL HISTORY) His son and PT are at the bedside.  Overall his condition is gradually improving.  Patient still has left-sided weakness but improving.  Smoking cessation education provided.   OBJECTIVE Temp:  [98.2 F (36.8 C)-98.9 F (37.2 C)] 98.2 F (36.8 C) (10/14 1555) Pulse Rate:  [75-89] 82 (10/14 1555) Cardiac Rhythm: Normal sinus rhythm (10/14 0700) Resp:  [13-22] 17 (10/14 1555) BP: (138-162)/(70-86) 152/79 (10/14 1555) SpO2:  [94 %-96 %] 95 % (10/14 1555) Weight:  [78.3 kg] 78.3 kg (10/14 0146)  Recent Labs  Lab 07/08/22 0932 07/10/22 0204  GLUCAP 235* 185*   Recent Labs  Lab 07/08/22 0930 07/08/22 0944 07/09/22 0935 07/10/22 0510 07/10/22 0755 07/10/22 1502  NA 130* 131* 128* 127* 129* 129*  K 4.3 4.3 4.1 3.7 3.8 3.7  CL 94* 91* 95* 92* 95* 93*  CO2 27  --  GLUCOSE 203* 204* 177* 163* 149* 137*  BUN CREATININE 0.78 0.80 0.80 0.81 0.93 0.90  CALCIUM 9.3  --  9.3 8.8* 8.7* 9.3  PHOS  --   --   --  3.0 2.9 2.8   Recent Labs  Lab 07/08/22 0930 07/10/22 0510 07/10/22 0755 07/10/22 1502  AST 26  --   --   --   ALT 29  --   --   --   ALKPHOS 69  --   --   --   BILITOT 0.8  --   --   --   PROT 7.5  --   --   --   ALBUMIN 4.2 3.8 3.6 4.0   Recent Labs  Lab 07/08/22 0930 07/08/22 0944 07/09/22 0935 07/10/22 0510  WBC 12.3*  --  12.7* 13.1*  NEUTROABS 7.9*  --   --  9.3*  HGB 17.6* 17.3* 16.5 16.2  HCT 49.4 51.0 46.3 43.6  MCV 89.5  --  89.0 87.6  PLT 259  --  206 207   No results for input(s): "CKTOTAL", "CKMB", "CKMBINDEX", "TROPONINI" in the last 168 hours. Recent Labs    07/08/22 0930  LABPROT 13.1  INR 1.0   Recent Labs    07/08/22 1028  COLORURINE STRAW*  LABSPEC 1.013  PHURINE 8.0  GLUCOSEU NEGATIVE  HGBUR NEGATIVE  BILIRUBINUR NEGATIVE  KETONESUR NEGATIVE  PROTEINUR NEGATIVE  NITRITE NEGATIVE  LEUKOCYTESUR NEGATIVE       Component Value  Date/Time   CHOL 170 07/10/2022 0510   TRIG 104 07/10/2022 0510   HDL 40 (L) 07/10/2022 0510   CHOLHDL 4.3 07/10/2022 0510   VLDL 21 07/10/2022 0510   LDLCALC 109 (H) 07/10/2022 0510   Lab Results  Component Value Date   HGBA1C 6.8 (H) 07/10/2022      Component Value Date/Time   LABOPIA NONE DETECTED 07/08/2022 1028   COCAINSCRNUR NONE DETECTED 07/08/2022 1028   LABBENZ NONE DETECTED 07/08/2022 1028   AMPHETMU NONE DETECTED 07/08/2022 1028   THCU NONE DETECTED 07/08/2022 1028   LABBARB NONE DETECTED 07/08/2022 1028    Recent Labs  Lab 07/08/22 0934  ETH <10    I have personally reviewed the radiological images below and agree with the radiology interpretations.  ECHOCARDIOGRAM COMPLETE  Result Date: 07/10/2022    ECHOCARDIOGRAM REPORT   Patient Name:   Vincent [name] Date of Exam: 07/10/2022 Medical Rec #:  [id number]    Height:       68.0 in Accession #:  [id number]   Weight:       172.6 lb Date of Birth:  May 23, 1946     BSA:          1.920 m Patient Age:    76 years     BP:           155/70 mmHg Patient Gender: M            HR:           82 bpm. Exam Location:  Inpatient Procedure: 2D Echo, Cardiac Doppler and Color Doppler Indications:    Stroke  History:        Patient has no prior history of Echocardiogram examinations.                 Risk Factors:Hypertension, Diabetes and Dyslipidemia.  Sonographer:    Ross Ludwig RDCS (AE) Referring Phys: Teddy Spike  Sonographer Comments: Technically difficult study due to poor echo windows. IMPRESSIONS  1. Left ventricular ejection fraction, by estimation, is 60 to 65%. The left ventricle has normal function. The left ventricle has no regional wall motion abnormalities. There is moderate left ventricular hypertrophy. Left ventricular diastolic parameters were normal.  2. Right ventricular systolic function is normal. The right ventricular size is normal. Tricuspid regurgitation signal is inadequate for assessing PA pressure.  3. The mitral  valve is normal in structure. Trivial mitral valve regurgitation. No evidence of mitral stenosis.  4. The aortic valve is grossly normal. Aortic valve regurgitation is not visualized. No aortic stenosis is present.  5. The inferior vena cava is normal in size with greater than 50% respiratory variability, suggesting right atrial pressure of 3 mmHg.  6. Cannot exclude a small PFO. 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 Left ventricular ejection fraction, by estimation, is 60 to 65%. The left ventricle has normal function. The left ventricle has no regional wall motion abnormalities. The left ventricular internal cavity size was normal in size. There is moderate left ventricular hypertrophy. Left ventricular diastolic parameters were normal. Right Ventricle: The right ventricular size is normal. No increase in right ventricular wall thickness. Right ventricular systolic function is normal. Tricuspid regurgitation signal is inadequate for assessing PA pressure. Left Atrium: Left atrial size was normal in size. Right Atrium: Right atrial size was normal in size. Pericardium: There is no evidence of pericardial effusion. Presence of epicardial fat layer. Mitral Valve: Chordal SAM. The mitral valve is normal in structure. Trivial mitral valve regurgitation. No evidence of mitral valve stenosis. Tricuspid Valve: The tricuspid valve is normal in structure. Tricuspid valve regurgitation is trivial. No evidence of tricuspid stenosis. Aortic Valve: The aortic valve is grossly normal. Aortic valve regurgitation is not visualized. No aortic stenosis is present. Aortic valve mean gradient measures 5.0 mmHg. Aortic valve peak gradient measures 7.8 mmHg. Aortic valve area, by VTI measures 3.36 cm. Pulmonic Valve: The pulmonic valve was not well visualized. Pulmonic valve regurgitation is not visualized. No evidence of pulmonic stenosis. Aorta: The aortic root is normal in size and structure. Venous:  The inferior vena cava is normal in size with greater than 50% respiratory variability, suggesting right atrial pressure of 3 mmHg. IAS/Shunts: There is redundancy of the interatrial septum. Cannot exclude a small PFO.  LEFT VENTRICLE PLAX 2D LVIDd:         4.40 cm   Diastology LVIDs:         2.70 cm   LV e' medial:    8.49  cm/s LV PW:         1.30 cm   LV E/e' medial:  8.7 LV IVS:        1.40 cm   LV e' lateral:   9.25 cm/s LVOT diam:     2.20 cm   LV E/e' lateral: 8.0 LV SV:         102 LV SV Index:   53 LVOT Area:     3.80 cm  RIGHT VENTRICLE RV Basal diam:  2.90 cm RV S prime:     16.20 cm/s TAPSE (M-mode): 2.6 cm LEFT ATRIUM             Index        RIGHT ATRIUM           Index LA diam:        3.40 cm 1.77 cm/m   RA Area:     15.10 cm LA Vol (A2C):   67.4 ml 35.11 ml/m  RA Volume:   37.50 ml  19.53 ml/m LA Vol (A4C):   18.2 ml 9.48 ml/m LA Biplane Vol: 38.7 ml 20.16 ml/m  AORTIC VALVE AV Area (Vmax):    4.02 cm AV Area (Vmean):   3.62 cm AV Area (VTI):     3.36 cm AV Vmax:           140.00 cm/s AV Vmean:          102.000 cm/s AV VTI:            0.304 m AV Peak Grad:      7.8 mmHg AV Mean Grad:      5.0 mmHg LVOT Vmax:         148.00 cm/s LVOT Vmean:        97.200 cm/s LVOT VTI:          0.269 m LVOT/AV VTI ratio: 0.88  AORTA Ao Root diam: 3.40 cm Ao Asc diam:  3.00 cm MITRAL VALVE MV Area (PHT): 2.69 cm    SHUNTS MV Decel Time: 282 msec    Systemic VTI:  0.27 m MV E velocity: 73.90 cm/s  Systemic Diam: 2.20 cm MV A velocity: 86.20 cm/s MV E/A ratio:  0.86 Weston BrassGayatri Acharya MD Electronically signed by Weston BrassGayatri Acharya MD Signature Date/Time: 07/10/2022/12:20:26 PM    Final    VAS US LOWER EXTREMITY VENOUS (DVT)  Result Date: 07/10/2022  Lower Venous DVT Study Patient Name:  Vincent [name]  Date of Exam:   07/10/2022 Medical Rec #: [id number]     Accession #:    [id number] Date of Birth: 12/18/1945      Patient Gender: M Patient Age:   [age] years [facility] Procedure:      VAS US  LOWER EXTREMITY VENOUS (DVT) Referring Phys: Margie EgeYRONE KYLE --------------------------------------------------------------------------------  Indications: Stroke.  Comparison Study: No previous exams Performing Technologist: Jody Hill RVT, RDMS  Examination Guidelines: A complete evaluation includes B-mode imaging, spectral Doppler, color Doppler, and power Doppler as needed of all accessible portions of each vessel. Bilateral testing is considered an integral part of a complete examination. Limited examinations for reoccurring indications may be performed as noted. The reflux portion of the exam is performed with the patient in reverse Trendelenburg.  +---------+---------------+---------+-----------+----------+--------------+ RIGHT    CompressibilityPhasicitySpontaneityPropertiesThrombus Aging +---------+---------------+---------+-----------+----------+--------------+ CFV      Full           Yes      Yes                                 +---------+---------------+---------+-----------+----------+--------------+  SFJ      Full                                                        +---------+---------------+---------+-----------+----------+--------------+ FV Prox  Full           Yes      Yes                                 +---------+---------------+---------+-----------+----------+--------------+ FV Mid   Full           Yes      Yes                                 +---------+---------------+---------+-----------+----------+--------------+ FV DistalFull           Yes      Yes                                 +---------+---------------+---------+-----------+----------+--------------+ PFV      Full                                                        +---------+---------------+---------+-----------+----------+--------------+ POP      Full           Yes      Yes                                 +---------+---------------+---------+-----------+----------+--------------+  PTV      Full                                                        +---------+---------------+---------+-----------+----------+--------------+ PERO     Full                                                        +---------+---------------+---------+-----------+----------+--------------+   +---------+---------------+---------+-----------+----------+--------------+ LEFT     CompressibilityPhasicitySpontaneityPropertiesThrombus Aging +---------+---------------+---------+-----------+----------+--------------+ CFV      Full           Yes      Yes                                 +---------+---------------+---------+-----------+----------+--------------+ SFJ      Full                                                        +---------+---------------+---------+-----------+----------+--------------+  FV Prox  Full           Yes      Yes                                 +---------+---------------+---------+-----------+----------+--------------+ FV Mid   Full           Yes      Yes                                 +---------+---------------+---------+-----------+----------+--------------+ FV DistalFull           Yes      Yes                                 +---------+---------------+---------+-----------+----------+--------------+ PFV      Full                                                        +---------+---------------+---------+-----------+----------+--------------+ POP      Full           Yes      Yes                                 +---------+---------------+---------+-----------+----------+--------------+ PTV      Full                                                        +---------+---------------+---------+-----------+----------+--------------+ PERO     Full                                                        +---------+---------------+---------+-----------+----------+--------------+     Summary: BILATERAL: - No evidence of deep  vein thrombosis seen in the lower extremities, bilaterally. -No evidence of popliteal cyst, bilaterally.   *See table(s) above for measurements and observations. Electronically signed by Monica Martinez MD on 07/10/2022 at 12:06:14 PM.    Final    DG CHEST PORT 1 VIEW  Result Date: 07/09/2022 CLINICAL DATA:  Weakness, hypertension, asthma EXAM: PORTABLE CHEST 1 VIEW COMPARISON:  03/15/2011 FINDINGS: Normal cardiac and mediastinal contours. No focal pulmonary opacity. No pleural effusion or pneumothorax. No acute osseous abnormality. Status post ACDF. IMPRESSION: No active disease. Electronically Signed   By: Merilyn Baba M.D.   On: 07/09/2022 14:11   MR Brain W and Wo Contrast  Result Date: 07/09/2022 CLINICAL DATA:  Provided history: Neuro deficit, acute, stroke suspected. Weakness in left arm and left leg. EXAM: MRI HEAD WITHOUT AND WITH CONTRAST TECHNIQUE: Multiplanar, multiecho pulse sequences of the brain and surrounding structures were obtained without and with intravenous contrast. CONTRAST:  72mL GADAVIST GADOBUTROL 1 MMOL/ML IV SOLN COMPARISON:  CT angiogram head/neck 07/08/2022. Non-contrast head CT 07/08/2022. FINDINGS: Intermittently motion degraded examination,  somewhat limiting evaluation. Most notably, the coronal T2 sequence is severely motion degraded. Brain: Mild generalized cerebral atrophy. 20 x 8 mm acute infarct within the right corona radiata/lentiform nucleus. Background mild multifocal T2 FLAIR hyperintense signal abnormality within the cerebral white matter, nonspecific but compatible with chronic small vessel ischemic disease. No evidence of an intracranial mass. No extra-axial fluid collection. No midline shift. No pathologic intracranial enhancement identified. Vascular: Maintained flow voids within the proximal large arterial vessels. Skull and upper cervical spine: No focal suspicious marrow lesion. Susceptibility artifact arising from ACDF hardware. Sinuses/Orbits: No mass  or acute finding within the imaged orbits. Complete T2 hyperintense opacification of the right frontal sinus. Mild mucosal thickening within the left frontal sinus. Moderate mucosal thickening within the bilateral ethmoid air cells. Mild mucosal thickening within the bilateral sphenoid and maxillary sinuses. IMPRESSION: 20 x 8 mm acute infarct within the right corona radiata/basal ganglia. Background mild chronic small vessel ischemic disease within the cerebral white matter. Mild generalized cerebral atrophy. Electronically Signed   By: Jackey Loge D.O.   On: 07/09/2022 12:10   CT ANGIO HEAD NECK W WO CM  Result Date: 07/08/2022 CLINICAL DATA:  Left-sided weakness. EXAM: CT ANGIOGRAPHY HEAD AND NECK TECHNIQUE: Multidetector CT imaging of the head and neck was performed using the standard protocol during bolus administration of intravenous contrast. Multiplanar CT image reconstructions and MIPs were obtained to evaluate the vascular anatomy. Carotid stenosis measurements (when applicable) are obtained utilizing NASCET criteria, using the distal internal carotid diameter as the denominator. RADIATION DOSE REDUCTION: This exam was performed according to the departmental dose-optimization program which includes automated exposure control, adjustment of the mA and/or kV according to patient size and/or use of iterative reconstruction technique. CONTRAST:  71mL OMNIPAQUE IOHEXOL 350 MG/ML SOLN COMPARISON:  None Available. FINDINGS: CTA NECK FINDINGS Aortic arch: Standard 3 vessel aortic arch with mild atherosclerotic plaque. No stenosis of the arch vessel origins. Right carotid system: Patent with a small amount of calcified and soft plaque in the carotid bulb. No evidence of a significant stenosis or dissection. Tortuous mid cervical ICA. Left carotid system: Patent with a small amount of predominantly calcified plaque in the carotid bulb. No evidence of a significant stenosis or dissection. Tortuous mid cervical  ICA. Vertebral arteries: Patent and codominant without evidence of a significant stenosis or dissection. Skeleton: C3-C7 ACDF widespread cervical facet arthrosis which is asymmetrically severe on the right. Other neck: No evidence of cervical lymphadenopathy or mass. Diminutive or absent right thyroid lobe. Upper chest: 6 mm subpleural nodule in the left upper lobe (series 5, image 19). Additional 3 mm nodule in the left upper lobe (series 5, image 26). Review of the MIP images confirms the above findings CTA HEAD FINDINGS Anterior circulation: The internal carotid arteries are patent from skull base to carotid termini with atherosclerosis resulting in mild bilateral cavernous and mild right and moderate left proximal supraclinoid stenoses. ACAs and MCAs are patent without evidence of a proximal branch occlusion or significant proximal stenosis. No aneurysm is identified. Posterior circulation: The intracranial vertebral arteries are patent to the basilar with atherosclerotic irregularity and up to mild stenosis bilaterally. The basilar artery is patent with a mild stenosis in its midportion. There is a small left posterior communicating artery. Both PCAs are patent without evidence of a significant proximal stenosis on the right. There is a severe proximal left P2 stenosis. No aneurysm is identified. Venous sinuses: Patent. Anatomic variants: None. Review of the MIP images confirms the  above findings These results were called by telephone at the time of interpretation on 07/08/2022 at 10:10 am to Dr. Elayne Snare, who verbally acknowledged these results. IMPRESSION: 1. No emergent large vessel occlusion. 2. Intracranial atherosclerosis including moderate left and mild right ICA stenoses, mild distal vertebral and basilar artery stenoses, and a severe left P2 stenosis. 3. Widely patent cervical carotid arteries. 4. Two left upper lobe pulmonary nodules measuring up to 6 mm. Per Fleischner Society Guidelines,  recommend a non-contrast Chest CT at 3-6 months, then consider another non-contrast Chest CT at 18-24 months. If patient is low risk for malignancy, non-contrast Chest CT at 18-24 months is optional. These guidelines do not apply to immunocompromised patients and patients with cancer. Follow up in patients with significant comorbidities as clinically warranted. For lung cancer screening, adhere to Lung-RADS guidelines. Reference: Radiology. 2017; 284(1):228-43. 5.  Aortic Atherosclerosis (ICD10-I70.0). Electronically Signed   By: Sebastian Ache M.D.   On: 07/08/2022 10:26   CT HEAD CODE STROKE WO CONTRAST  Result Date: 07/08/2022 CLINICAL DATA:  Code stroke.  Left-sided weakness. EXAM: CT HEAD WITHOUT CONTRAST TECHNIQUE: Contiguous axial images were obtained from the base of the skull through the vertex without intravenous contrast. RADIATION DOSE REDUCTION: This exam was performed according to the departmental dose-optimization program which includes automated exposure control, adjustment of the mA and/or kV according to patient size and/or use of iterative reconstruction technique. COMPARISON:  None Available. FINDINGS: Brain: There is no evidence of an acute infarct, intracranial hemorrhage, mass, midline shift, or extra-axial fluid collection. The ventricles and sulci are within normal limits for age. Hypodensities in the cerebral white matter bilaterally are nonspecific but compatible with mild chronic small vessel ischemic disease. Vascular: Calcified atherosclerosis at the skull base. No hyperdense vessel. Skull: No fracture. Prominent periapical lucency involving a right maxillary molar tooth. Sinuses/Orbits: Mild mucosal thickening in the paranasal sinuses. Clear mastoid air cells. Unremarkable orbits. Other: None. ASPECTS [facility] Stroke Program Early CT Score) - Ganglionic level infarction (caudate, lentiform nuclei, internal capsule, insula, M1-M3 cortex): 7 - Supraganglionic infarction (M4-M6  cortex): 3 Total score (0-10 with 10 being normal): 10 IMPRESSION: 1. No evidence of acute intracranial abnormality. ASPECTS of 10. 2. Mild chronic small vessel ischemic disease. These results were called by telephone at the time of interpretation on 07/08/2022 at 10:10 am to Dr. Elayne Snare, who verbally acknowledged these results. Electronically Signed   By: Sebastian Ache M.D.   On: 07/08/2022 10:10     PHYSICAL EXAM  Temp:  [98.2 F (36.8 C)-98.9 F (37.2 C)] 98.2 F (36.8 C) (10/14 1555) Pulse Rate:  [75-89] 82 (10/14 1555) Resp:  [13-22] 17 (10/14 1555) BP: (138-162)/(70-86) 152/79 (10/14 1555) SpO2:  [94 %-96 %] 95 % (10/14 1555) Weight:  [78.3 kg] 78.3 kg (10/14 0146)  General - Well nourished, well developed, in no apparent distress.  Ophthalmologic - fundi not visualized due to noncooperation.  Cardiovascular - Regular rhythm and rate.  Neuro - awake, alert, eyes open, orientated to age, place, time and people. No aphasia, fluent language, following all simple commands. Able to name and repeat. No gaze palsy, tracking bilaterally, visual field full. No facial droop. Tongue midline. RUE and RLE 5/5, LUE drift 4/5 proximal, 4+/5 bicep and tricep. LLE proximal 3+/5 with drift, distal ankle DF/PF 2/5.  Sensation symmetrical bilaterally, left FTN ataxia but not out of proportion to weakness, gait not tested.     ASSESSMENT/PLAN Vincent [name] is a 76 y.o. male with  history of hypertension, diabetes, smoker, COPD, thyroid cancer and neurofibromatosis admitted for left-sided weakness. No tPA given due to outside window.    Stroke:  right BG/CR infarct, likely secondary to small vessel disease source CT no acute abnormality CTA head and neck left ICA siphon moderate and left P2 severe stenosis MRI right BG/CR infarct 2D Echo EF 60 to 65% LE venous Doppler no DVT LDL 109 HgbA1c 6.8 UDS negative Lovenox for VTE prophylaxis aspirin 81 mg daily prior to admission, now on  aspirin 81 mg daily and clopidogrel 75 mg daily DAPT for 3 weeks and then Plavix alone. Patient counseled to be compliant with his antithrombotic medications Ongoing aggressive stroke risk factor management Therapy recommendations: CIR Disposition: Pending  Diabetes HgbA1c 6.8 goal < 7.0 Controlled CBG monitoring SSI DM education and close PCP follow up  Hypertension Stable on the high and gradually normalized within 2-3 days. Long term BP goal normotensive  Hyperlipidemia Home meds: None LDL 109, goal < 70 Now on Lipitor 40 Continue statin at discharge  Tobacco abuse Current smoker Smoking cessation counseling provided Nicotine patch provided Pt is willing to quit  Other Stroke Risk Factors Advanced age  Other Active Problems COPD Thyroid cancer Neurofibromatosis  Hospital day # 1  Neurology will sign off. Please call with questions. Pt will follow up with stroke clinic NP at [facility] in about 4 weeks. Thanks for the consult.   Marvel Plan, MD PhD Stroke Neurology 07/10/2022 5:17 PM    To contact Stroke Continuity provider, please refer to [email or web address]. After hours, contact General Neurology

## 2022-07-10 NOTE — Progress Notes (Addendum)
[facility] PROGRESS NOTE   Vincent [name] [id number] DOB: May 22, 1946 DOA: 07/09/2022  PCP: [facility]  Brief History/Interval Summary: 76 y.o. male with medical history significant for HTN, DM2, hypothyroidism.  Patient presented with left leg weakness.  He was found to have an acute stroke.  Initially he left AGAINST MEDICAL ADVICE but then came back the next day due to persistent weakness.    Consultants: Neurology  Procedures: Transthoracic echocardiogram.  Lower extremity Doppler study.    Subjective/Interval History: Patient mentioned that he continues to have left arm and left leg weakness.  Denies any chest pain shortness of breath.  Smokes on a daily basis.  Drinks 2 beers on a daily basis.  Lives by himself.    Assessment/Plan:  Acute stroke Patient has left hemiplegia.  Patient underwent MRI brain and CT angiogram head and neck. Neurology is following. LDL is 109.  Patient noted to be on statin. HbA1c 6.8. Patient was loaded with Plavix.  Now on aspirin and Plavix.  Neurology to determine duration. Transthoracic echocardiogram is pending  Left lower extremity edema Lower extremity Doppler studies pending  Diabetes mellitus type 2, controlled HbA1c is 6.8.  Continue SSI.  Essential hypertension Permissive hypertension being allowed.  Left upper lobe pulmonary nodules Incidentally noted on CT angiogram.  Patient is aware of these and is followed for the same at the [place].  He gets yearly CT scans for same.  Alcohol abuse Drinks 2 beers a day.  Does not seem to excessive.  Denies any liquor use.  Placed on CIWA protocol. Thiamine folate multivitamins.  Hyponatremia Seems to be better this morning.  Hypothyroidism Continue home medications.   DVT Prophylaxis: Start Lovenox Code Status: Full code Family Communication: Discussed with patient Disposition Plan: Patient lives by himself.  Disposition to be determined.  Waiting on PT and  OT evaluation.  Status is: Inpatient Remains inpatient appropriate because: Acute stroke      Medications: Scheduled:  aspirin EC  81 mg Oral Daily   atorvastatin  80 mg Oral Daily   clopidogrel  75 mg Oral Daily   folic acid  1 mg Oral Daily   levothyroxine  125 mcg Oral Q0600   multivitamin with minerals  1 tablet Oral Daily   nicotine  21 mg Transdermal Daily   thiamine  100 mg Oral Daily   Or   thiamine  100 mg Intravenous Daily   Continuous: [id number] **OR** [DISCONTINUED] acetaminophen (TYLENOL) oral liquid 160 mg/5 mL **OR** [DISCONTINUED] acetaminophen, albuterol, LORazepam **OR** LORazepam, senna-docusate  Antibiotics: Anti-infectives (From admission, onward)    None       Objective:  Vital Signs  Vitals:   07/09/22 2130 07/10/22 0030 07/10/22 0146 07/10/22 0602  BP: (!) 155/79 138/72 (!) 162/86 (!) 155/70  Pulse: 89 79 81 75  Resp: 13 (!) 22 18 18   Temp:   98.3 F (36.8 C) 98.9 F (37.2 C)  TempSrc:   Oral Oral  SpO2: 95% 96% 95% 94%  Weight:   78.3 kg   Height:   5\' 8"  (1.727 m)     Intake/Output Summary (Last 24 hours) at 07/10/2022 1020 Last data filed at 07/10/2022 1000 Gross per 24 hour  Intake 740 ml  Output 350 ml  Net 390 ml   Filed Weights   07/09/22 0911 07/10/22 0146  Weight: 74.8 kg 78.3 kg    General appearance: Awake alert.  In no distress Resp: Normal effort coarse breath sound bilaterally.  No wheezing  or rhonchi. Cardio: S1-S2 is normal regular.  No S3-S4.  No rubs murmurs or bruit GI: Abdomen is soft.  Nontender nondistended.  Bowel sounds are present normal.  No masses organomegaly Extremities: No edema.  Full range of motion of lower extremities. Neurologic: Alert and oriented x3.  Left arm and leg weakness noted.   Lab Results:  Data Reviewed: I have personally reviewed following labs and reports of the imaging studies  CBC: Recent Labs  Lab 07/08/22 0930 07/08/22 0944 07/09/22 0935 07/10/22 0510   WBC 12.3*  --  12.7* 13.1*  NEUTROABS 7.9*  --   --  9.3*  HGB 17.6* 17.3* 16.5 16.2  HCT 49.4 51.0 46.3 43.6  MCV 89.5  --  89.0 87.6  PLT 259  --  206 616    Basic Metabolic Panel: Recent Labs  Lab 07/08/22 0930 07/08/22 0944 07/09/22 0935 07/10/22 0510 07/10/22 0755  NA 130* 131* 128* 127* 129*  K 4.3 4.3 4.1 3.7 3.8  CL 94* 91* 95* 92* 95*  CO2 27  --  25 25 31   GLUCOSE 203* 204* 177* 163* 149*  BUN 13 12 13 11 12   CREATININE 0.78 0.80 0.80 0.81 0.93  CALCIUM 9.3  --  9.3 8.8* 8.7*  PHOS  --   --   --  3.0 2.9    GFR: Estimated Creatinine Clearance: 66.4 mL/min (by C-G formula based on SCr of 0.93 mg/dL).  Liver Function Tests: Recent Labs  Lab 07/08/22 0930 07/10/22 0510 07/10/22 0755  AST 26  --   --   ALT 29  --   --   ALKPHOS 69  --   --   BILITOT 0.8  --   --   PROT 7.5  --   --   ALBUMIN 4.2 3.8 3.6     Coagulation Profile: Recent Labs  Lab 07/08/22 0930  INR 1.0    HbA1C: Recent Labs    07/10/22 0510  HGBA1C 6.8*    CBG: Recent Labs  Lab 07/08/22 0932 07/10/22 0204  GLUCAP 235* 185*    Lipid Profile: Recent Labs    07/10/22 0510  CHOL 170  HDL 40*  LDLCALC 109*  TRIG 104  CHOLHDL 4.3     Radiology Studies: DG CHEST PORT 1 VIEW  Result Date: 07/09/2022 CLINICAL DATA:  Weakness, hypertension, asthma EXAM: PORTABLE CHEST 1 VIEW COMPARISON:  03/15/2011 FINDINGS: Normal cardiac and mediastinal contours. No focal pulmonary opacity. No pleural effusion or pneumothorax. No acute osseous abnormality. Status post ACDF. IMPRESSION: No active disease. Electronically Signed   By: Merilyn Baba M.D.   On: 07/09/2022 14:11   MR Brain W and Wo Contrast  Result Date: 07/09/2022 CLINICAL DATA:  Provided history: Neuro deficit, acute, stroke suspected. Weakness in left arm and left leg. EXAM: MRI HEAD WITHOUT AND WITH CONTRAST TECHNIQUE: Multiplanar, multiecho pulse sequences of the brain and surrounding structures were obtained  without and with intravenous contrast. CONTRAST:  67mL GADAVIST GADOBUTROL 1 MMOL/ML IV SOLN COMPARISON:  CT angiogram head/neck 07/08/2022. Non-contrast head CT 07/08/2022. FINDINGS: Intermittently motion degraded examination, somewhat limiting evaluation. Most notably, the coronal T2 sequence is severely motion degraded. Brain: Mild generalized cerebral atrophy. 20 x 8 mm acute infarct within the right corona radiata/lentiform nucleus. Background mild multifocal T2 FLAIR hyperintense signal abnormality within the cerebral white matter, nonspecific but compatible with chronic small vessel ischemic disease. No evidence of an intracranial mass. No extra-axial fluid collection. No midline shift. No pathologic intracranial enhancement  identified. Vascular: Maintained flow voids within the proximal large arterial vessels. Skull and upper cervical spine: No focal suspicious marrow lesion. Susceptibility artifact arising from ACDF hardware. Sinuses/Orbits: No mass or acute finding within the imaged orbits. Complete T2 hyperintense opacification of the right frontal sinus. Mild mucosal thickening within the left frontal sinus. Moderate mucosal thickening within the bilateral ethmoid air cells. Mild mucosal thickening within the bilateral sphenoid and maxillary sinuses. IMPRESSION: 20 x 8 mm acute infarct within the right corona radiata/basal ganglia. Background mild chronic small vessel ischemic disease within the cerebral white matter. Mild generalized cerebral atrophy. Electronically Signed   By: Kellie Simmering D.O.   On: 07/09/2022 12:10       LOS: 1 day   Gottfried Standish [employer] on [email or web address]  07/10/2022, 10:20 AM

## 2022-07-10 NOTE — Plan of Care (Signed)
  Problem: Education: Goal: Knowledge of General Education information will improve Description: Including pain rating scale, medication(s)/side effects and non-pharmacologic comfort measures Outcome: Progressing   Problem: Health Behavior/Discharge Planning: Goal: Ability to manage health-related needs will improve Outcome: Progressing   Problem: Clinical Measurements: Goal: Ability to maintain clinical measurements within normal limits will improve Outcome: Progressing Goal: Will remain free from infection Outcome: Progressing Goal: Diagnostic test results will improve Outcome: Progressing Goal: Respiratory complications will improve Outcome: Progressing Goal: Cardiovascular complication will be avoided Outcome: Progressing   Problem: Activity: Goal: Risk for activity intolerance will decrease Outcome: Progressing   Problem: Nutrition: Goal: Adequate nutrition will be maintained Outcome: Progressing   Problem: Coping: Goal: Level of anxiety will decrease Outcome: Progressing   Problem: Elimination: Goal: Will not experience complications related to bowel motility Outcome: Progressing Goal: Will not experience complications related to urinary retention Outcome: Progressing   Problem: Pain Managment: Goal: General experience of comfort will improve Outcome: Progressing   Problem: Safety: Goal: Ability to remain free from injury will improve Outcome: Progressing   Problem: Skin Integrity: Goal: Risk for impaired skin integrity will decrease Outcome: Progressing   Problem: Education: Goal: Knowledge of secondary prevention will improve (SELECT ALL) Outcome: Progressing Goal: Knowledge of patient specific risk factors will improve (INDIVIDUALIZE FOR PATIENT) Outcome: Progressing   Problem: Ischemic Stroke/TIA Tissue Perfusion: Goal: Complications of ischemic stroke/TIA will be minimized Outcome: Progressing

## 2022-07-10 NOTE — Evaluation (Signed)
Occupational Therapy Evaluation Patient Details Name: Vincent [name] MRN: [id number] DOB: 1946/09/16 Today's Date: 07/10/2022   History of Present Illness 76 y.o. male Presenting 10/13/23with left leg weakness. MRI brain acute infarct within the right corona radiata/basal ganglia. NIHSS=4; CIWA protocol; doppler LLE negative for DVT   PMH significant of HTN, DM2, shoulder arthroscopy   Clinical Impression   PTA, pt lived alone and was independent. Pt presents with LUE and LLE ataxia, generalized weakness, decreased activity tolerance, balance, and UE functional use. Pt performing LB ADL with min-mod A and UB ADL with min A. Pt performing stand pivot transfers with mod A using RW this session. Requiring step-by step verbal cues for sequencing transfer with RW. Highly recommend AIR to optimize safety and independence in ADL and IADL.     Recommendations for follow up therapy are one component of a multi-disciplinary discharge planning process, led by the attending physician.  Recommendations may be updated based on patient status, additional functional criteria and insurance authorization.   Follow Up Recommendations  Acute inpatient rehab (3hours/day)    Assistance Recommended at Discharge Frequent or constant Supervision/Assistance  Patient can return home with the following A lot of help with walking and/or transfers;A little help with bathing/dressing/bathroom;Assistance with cooking/housework;Direct supervision/assist for medications management;Direct supervision/assist for financial management;Assist for transportation;Help with stairs or ramp for entrance    Functional Status Assessment  Patient has had a recent decline in their functional status and demonstrates the ability to make significant improvements in function in a reasonable and predictable amount of time.  Equipment Recommendations  Other (comment) (TBD next venue)    Recommendations for Other Services Rehab consult      Precautions / Restrictions Precautions Precautions: Fall Precaution Comments: ataxic Restrictions Weight Bearing Restrictions: No      Mobility Bed Mobility               General bed mobility comments: In recliner on arrival and departure    Transfers Overall transfer level: Needs assistance Equipment used: Rolling walker (2 wheels) Transfers: Sit to/from Stand, Bed to chair/wheelchair/BSC Sit to Stand: Min assist     Step pivot transfers: Mod assist     General transfer comment: Mod A for stand pivot after fatigue due to session with PT.      Balance Overall balance assessment: Needs assistance Sitting-balance support: No upper extremity supported, Feet supported Sitting balance-Leahy Scale: Poor Sitting balance - Comments: Min guard A when reaching toward feet, however min A when attempting to perform figure 4 as trunk observed to lean backward   Standing balance support: Bilateral upper extremity supported, Reliant on assistive device for balance Standing balance-Leahy Scale: Poor Standing balance comment: reliant on external assist                           ADL either performed or assessed with clinical judgement   ADL Overall ADL's : Needs assistance/impaired Eating/Feeding: Minimal assistance Eating/Feeding Details (indicate cue type and reason): min A for bil tasks (cutting meat, etc) Grooming: Oral care;Set up;Sitting Grooming Details (indicate cue type and reason): Pt using problem solving to overcome LUE incoordination during oral care. Upper Body Bathing: Set up;Sitting   Lower Body Bathing: Minimal assistance;Sit to/from stand   Upper Body Dressing : Minimal assistance   Lower Body Dressing: Minimal assistance;Sitting/lateral leans Lower Body Dressing Details (indicate cue type and reason): Min A. Pt doffing L sock with significantly increased time, but unable to don due  to poor coordination and dexterity as well as inability to  perform figure 4 or adequately reach down to feet. Pt donning and doffing R sock with significantly incresaed time. Able to perform modified figure 4 Toilet Transfer: Minimal assistance;Moderate assistance;Stand-pivot;BSC/3in1;Rolling walker (2 wheels) Toilet Transfer Details (indicate cue type and reason): Max multimodal cues to coordinate RW and BLE. Toileting- Clothing Manipulation and Hygiene: Moderate assistance;Sit to/from stand       Functional mobility during ADLs: Minimal assistance;Moderate assistance;Rolling walker (2 wheels) General ADL Comments: Min-mod A for functional mobility. Up to max cues needed with fatigue     Vision Baseline Vision/History: 0 No visual deficits Ability to See in Adequate Light: 0 Adequate Patient Visual Report: No change from baseline Vision Assessment?: No apparent visual deficits Additional Comments: No apparent deficits but continue to assess     Perception     Praxis Praxis Praxis-Other Comments: difficulty with carrying out desired motor actions with LUE    Pertinent Vitals/Pain Pain Assessment Pain Assessment: No/denies pain     Hand Dominance Right   Extremity/Trunk Assessment Upper Extremity Assessment Upper Extremity Assessment: LUE deficits/detail LUE Deficits / Details: ataxic. Undershooting/overshooting. Difficulty with coordination, Dysmetria, dysdiadokinesia slowed as compared to R. LUE drift with bil shoulder flexion. Poor coordination and dexterity to don socks.   Lower Extremity Assessment Lower Extremity Assessment: Defer to PT evaluation LLE Deficits / Details: hip 4/5, knee 3/5, ankle DF 2+ LLE Sensation: WNL LLE Coordination: decreased gross motor;decreased fine motor   Cervical / Trunk Assessment Cervical / Trunk Assessment: Kyphotic   Communication Communication Communication: No difficulties   Cognition Arousal/Alertness: Awake/alert Behavior During Therapy: WFL for tasks assessed/performed Overall  Cognitive Status: Impaired/Different from baseline Area of Impairment: Safety/judgement, Awareness                         Safety/Judgement: Decreased awareness of deficits Awareness: Emergent   General Comments: Reporting he wants to discharge home, despite awareness of difficulties. More difficulty following commands with fatigue. Rquiring step by step cues during mobility. Good problem solving of BUE tasks, but ultimately still requires assist.     General Comments  Brother present    Exercises     Shoulder Instructions      Home Living Family/patient expects to be discharged to:: Private residence Living Arrangements: Alone Available Help at Discharge: Family;Available PRN/intermittently Type of Home: Apartment Home Access: Level entry     Home Layout: One level     Bathroom Shower/Tub: Occupational psychologist: Standard     Home Equipment: None          Prior Functioning/Environment Prior Level of Function : Independent/Modified Independent;Driving                        OT Problem List: Decreased strength;Decreased activity tolerance;Impaired balance (sitting and/or standing);Decreased coordination;Decreased safety awareness;Decreased knowledge of use of DME or AE;Decreased knowledge of precautions;Decreased cognition;Decreased range of motion;Impaired UE functional use      OT Treatment/Interventions: Self-care/ADL training;Therapeutic exercise;Cognitive remediation/compensation;Therapeutic activities;Neuromuscular education;DME and/or AE instruction;Balance training;Patient/family education    OT Goals(Current goals can be found in the care plan section) Acute Rehab OT Goals Patient Stated Goal: go home OT Goal Formulation: With patient Time For Goal Achievement: 07/24/22 Potential to Achieve Goals: Good  OT Frequency: Min 2X/week    Co-evaluation              AM-PAC OT "6 Clicks" Daily  Activity     Outcome Measure  Help from another person eating meals?: None Help from another person taking care of personal grooming?: A Little Help from another person toileting, which includes using toliet, bedpan, or urinal?: A Lot Help from another person bathing (including washing, rinsing, drying)?: A Lot Help from another person to put on and taking off regular upper body clothing?: A Little Help from another person to put on and taking off regular lower body clothing?: A Little 6 Click Score: 17   End of Session Equipment Utilized During Treatment: Gait belt;Rolling walker (2 wheels) Nurse Communication: Mobility status  Activity Tolerance: Patient tolerated treatment well Patient left: in chair;with call bell/phone within reach;with chair alarm set  OT Visit Diagnosis: Unsteadiness on feet (R26.81);Muscle weakness (generalized) (M62.81);Other abnormalities of gait and mobility (R26.89);Other symptoms and signs involving cognitive function;Ataxia, unspecified (R27.0)                Time: 1420-1445 OT Time Calculation (min): 25 min Charges:  OT General Charges $OT Visit: 1 Visit OT Evaluation $OT Eval Moderate Complexity: 1 Mod OT Treatments $Self Care/Home Management : 8-22 mins  Shanda Howells, OTR/L [facility] Acute Rehabilitation Office: [phone]  Lula Olszewski 07/10/2022, 3:28 PM

## 2022-07-10 NOTE — Evaluation (Signed)
Physical Therapy Evaluation Patient Details Name: Vincent [name] MRN: [id number] DOB: [birth year] Today's Date: 07/10/2022  History of Present Illness  76 y.o. male Presenting 10/13/23with left leg weakness. MRI brain acute infarct within the right corona radiata/basal ganglia. NIHSS=4; CIWA protocol; doppler LLE negative for DVT   PMH significant of HTN, DM2, shoulder arthroscopy  Clinical Impression   Pt admitted secondary to problem above with deficits below. PTA patient was living alone and independent. Pt currently requires min assist with max cues for ambulating x 25 ft with RW. He demonstrates ataxia vs weakness vs tone when attempting to place LLE and requires cues to place farther to his left (tends to adduct to midline). Patient reports currently no one he can go stay with or can come stay with him, but he'll think about it.  Anticipate patient will benefit from PT to address problems listed below.Will continue to follow acutely to maximize functional mobility independence and safety.          Recommendations for follow up therapy are one component of a multi-disciplinary discharge planning process, led by the attending physician.  Recommendations may be updated based on patient status, additional functional criteria and insurance authorization.  Follow Up Recommendations Acute inpatient rehab (3hours/day)      Assistance Recommended at Discharge Intermittent Supervision/Assistance  Patient can return home with the following  A little help with walking and/or transfers;A little help with bathing/dressing/bathroom;Assistance with cooking/housework;Assist for transportation;Help with stairs or ramp for entrance    Equipment Recommendations Rolling walker (2 wheels)  Recommendations for Other Services  OT consult;Rehab consult;Speech consult    Functional Status Assessment Patient has had a recent decline in their functional status and demonstrates the ability to make significant  improvements in function in a reasonable and predictable amount of time.     Precautions / Restrictions Precautions Precautions: Fall      Mobility  Bed Mobility Overal bed mobility: Needs Assistance Bed Mobility: Supine to Sit     Supine to sit: Min guard     General bed mobility comments: incr time and effort with near need for assist to raise torso    Transfers Overall transfer level: Needs assistance Equipment used: 1 person hand held assist, Rolling walker (2 wheels) Transfers: Sit to/from Stand, Bed to chair/wheelchair/BSC Sit to Stand: Min assist   Step pivot transfers: Min assist       General transfer comment: without RW +1 HHA and bed rail on opposite side; step-pivot to chair on his right with pt reaching to hold onto both armrests as he stepped/pivoted to sit; from recliner with bil Armrests with min boosting assist    Ambulation/Gait Ambulation/Gait assistance: Min assist Gait Distance (Feet): 25 Feet Assistive device: Rolling walker (2 wheels) Gait Pattern/deviations: Step-to pattern, Decreased step length - right, Decreased stance time - left, Decreased dorsiflexion - left, Knee hyperextension - left Gait velocity: decr Gait velocity interpretation: <1.31 ft/sec, indicative of household ambulator   General Gait Details: LLE tends to adduct, but pt able to correct with cues; able to maintain left hand on RW without assist, but in an awkward position; max cues for walker proximity and to stay inside RW during turn  Stairs            Wheelchair Mobility    Modified Rankin (Stroke Patients Only) Modified Rankin (Stroke Patients Only) Pre-Morbid Rankin Score: No symptoms Modified Rankin: Moderately severe disability     Balance Overall balance assessment: Needs assistance Sitting-balance support: No upper extremity  supported, Feet supported Sitting balance-Leahy Scale: Fair     Standing balance support: Bilateral upper extremity supported,  Reliant on assistive device for balance Standing balance-Leahy Scale: Poor                               Pertinent Vitals/Pain Pain Assessment Pain Assessment: No/denies pain    Home Living Family/patient expects to be discharged to:: Private residence Living Arrangements: Alone Available Help at Discharge: Family;Available PRN/intermittently Type of Home: Apartment Home Access: Level entry       Home Layout: One level Home Equipment: None      Prior Function Prior Level of Function : Independent/Modified Independent;Driving                     Hand Dominance   Dominant Hand: Right    Extremity/Trunk Assessment   Upper Extremity Assessment Upper Extremity Assessment: Defer to OT evaluation (generally weak, but able to hold onto walker handle)    Lower Extremity Assessment Lower Extremity Assessment: LLE deficits/detail LLE Deficits / Details: hip 4/5, knee 3/5, ankle DF 2+ LLE Sensation: WNL LLE Coordination: decreased gross motor;decreased fine motor    Cervical / Trunk Assessment Cervical / Trunk Assessment: Kyphotic  Communication   Communication: No difficulties  Cognition Arousal/Alertness: Awake/alert Behavior During Therapy: WFL for tasks assessed/performed Overall Cognitive Status: Impaired/Different from baseline Area of Impairment: Safety/judgement, Awareness                         Safety/Judgement: Decreased awareness of deficits Awareness: Emergent   General Comments: initially wanting to leave today; as mobilizing became more aware of his deficits and not ready to "run down the hall"        General Comments General comments (skin integrity, edema, etc.): Brother present.    Exercises General Exercises - Lower Extremity Long Arc Quad: AROM, Left Toe Raises: AROM, Left   Assessment/Plan    PT Assessment Patient needs continued PT services  PT Problem List Decreased strength;Decreased balance;Decreased  mobility;Decreased coordination;Decreased cognition;Decreased knowledge of use of DME;Decreased safety awareness;Decreased knowledge of precautions       PT Treatment Interventions DME instruction;Gait training;Functional mobility training;Therapeutic activities;Therapeutic exercise;Balance training;Neuromuscular re-education;Cognitive remediation;Patient/family education    PT Goals (Current goals can be found in the Care Plan section)  Acute Rehab PT Goals Patient Stated Goal: go home alone PT Goal Formulation: With patient Time For Goal Achievement: 07/24/22 Potential to Achieve Goals: Good    Frequency Min 4X/week     Co-evaluation               AM-PAC PT "6 Clicks" Mobility  Outcome Measure Help needed turning from your back to your side while in a flat bed without using bedrails?: A Little Help needed moving from lying on your back to sitting on the side of a flat bed without using bedrails?: A Little Help needed moving to and from a bed to a chair (including a wheelchair)?: A Little Help needed standing up from a chair using your arms (e.g., wheelchair or bedside chair)?: A Little Help needed to walk in hospital room?: A Lot Help needed climbing 3-5 steps with a railing? : Total 6 Click Score: 15    End of Session Equipment Utilized During Treatment: Gait belt Activity Tolerance: Patient tolerated treatment well Patient left: in chair;with call bell/phone within reach;with chair alarm set Nurse Communication: Mobility status PT  Visit Diagnosis: Hemiplegia and hemiparesis Hemiplegia - Right/Left: Left Hemiplegia - dominant/non-dominant: Non-dominant Hemiplegia - caused by: Cerebral infarction    Time: 0037-0488 PT Time Calculation (min) (ACUTE ONLY): 26 min   Charges:     PT Treatments $Gait Training: 8-22 mins         Jerolyn Center, PT Acute Rehabilitation Services  Office [phone]   Zena Amos 07/10/2022, 2:12 PM

## 2022-07-10 NOTE — Progress Notes (Signed)
  Echocardiogram 2D Echocardiogram has been performed.  Vincent [name] 07/10/2022, 12:00 PM

## 2022-07-10 NOTE — Progress Notes (Signed)
OT Cancellation Note  Patient Details Name: Anik Wesch MRN: [id number] DOB: 1946-06-23   Cancelled Treatment:    Reason Eval/Treat Not Completed: Medical issues which prohibited therapy. Waiting on vascular to rule out DVT due to LLE edema.   Shanda Howells, OTR/L [facility] Acute Rehabilitation Office: [phone]   Lula Olszewski 07/10/2022, 11:20 AM

## 2022-07-10 NOTE — Progress Notes (Signed)
BLE venous duplex has been completed.   Results can be found under chart review under CV PROC. 07/10/2022 11:54 AM Tannisha Kennington RVT, RDMS

## 2022-07-10 NOTE — Progress Notes (Signed)
PT Cancellation Note  Patient Details Name: Vincent [name] MRN: [id number] DOB: [birth year]   Cancelled Treatment:    Reason Eval/Treat Not Completed: Patient not medically ready  Noted LLE edema with vascular study pending to rule out DVT.    [place]  Office [phone]   Rexanne Mano 07/10/2022, 9:08 AM

## 2022-07-11 ENCOUNTER — Other Ambulatory Visit (HOSPITAL_COMMUNITY): Payer: No Typology Code available for payment source

## 2022-07-11 LAB — BASIC METABOLIC PANEL
Anion gap: 12 (ref 5–15)
BUN: 16 mg/dL (ref 8–23)
CO2: 23 mmol/L (ref 22–32)
Calcium: 8.6 mg/dL — ABNORMAL LOW (ref 8.9–10.3)
Chloride: 94 mmol/L — ABNORMAL LOW (ref 98–111)
Creatinine, Ser: 0.89 mg/dL (ref 0.61–1.24)
GFR, Estimated: >60 mL/min (ref >60)
Glucose, Bld: 155 mg/dL — ABNORMAL HIGH (ref 70–99)
Potassium: 3.6 mmol/L (ref 3.5–5.1)
Sodium: 129 mmol/L — ABNORMAL LOW (ref 135–145)

## 2022-07-11 LAB — CBC
HCT: 42.9 % (ref 39.0–52.0)
Hemoglobin: 15.5 g/dL (ref 13.0–17.0)
MCH: 32 pg (ref 26.0–34.0)
MCHC: 36.1 g/dL — ABNORMAL HIGH (ref 30.0–36.0)
MCV: 88.6 fL (ref 80.0–100.0)
Platelets: 208 10*3/uL (ref 150–400)
RBC: 4.84 MIL/uL (ref 4.22–5.81)
RDW: 12 % (ref 11.5–15.5)
WBC: 13.6 10*3/uL — ABNORMAL HIGH (ref 4.0–10.5)
nRBC: 0 % (ref 0.0–0.2)

## 2022-07-11 MED ORDER — ASPIRIN 81 MG PO TBEC
81.0000 mg | DELAYED_RELEASE_TABLET | Freq: Every day | ORAL | Status: DC
  Administered 2022-07-12 – 2022-07-13 (×2): 81 mg via ORAL
  Filled 2022-07-11 (×2): qty 1

## 2022-07-11 MED ORDER — SODIUM CHLORIDE 1 G PO TABS
2.0000 g | ORAL_TABLET | Freq: Two times a day (BID) | ORAL | Status: DC
  Administered 2022-07-11 – 2022-07-12 (×2): 2 g via ORAL
  Filled 2022-07-11 (×2): qty 2

## 2022-07-11 NOTE — Progress Notes (Addendum)
Physical Therapy Treatment Patient Details Name: Vincent [name] MRN: [id number] DOB: 09-Dec-1945 Today's Date: 07/11/2022   History of Present Illness 76 y.o. male Presenting 07/09/22 with left leg weakness. 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 NIHSS=4; CIWA protocol; doppler LLE negative for DVT. PMH significant of HTN, DM2, shoulder arthroscopy    PT Comments    The pt was able to make good progress with progression of hallway ambulation and strengthening exercises for LE. He continues to need min-modA with gait due to L knee hyperextension and progressively reduced DF of LLE with fatigue. He is able to maintain for short bouts with max cues but unable to sustain without direct cues. The pt was then able to complete repeated sit-stand transfers with modA to L knee to maintain positioning and max cues for engaging hips and LE. Recommendations remain appropriate at this time.    Recommendations for follow up therapy are one component of a multi-disciplinary discharge planning process, led by the attending physician.  Recommendations may be updated based on patient status, additional functional criteria and insurance authorization.  Follow Up Recommendations  Acute inpatient rehab (3hours/day)     Assistance Recommended at Discharge Intermittent Supervision/Assistance  Patient can return home with the following A little help with walking and/or transfers;A little help with bathing/dressing/bathroom;Assistance with cooking/housework;Assist for transportation;Help with stairs or ramp for entrance   Equipment Recommendations  Rolling walker (2 wheels)    Recommendations for Other Services       Precautions / Restrictions Precautions Precautions: Fall Precaution Comments: ataxic Restrictions Weight Bearing Restrictions: No     Mobility  Bed Mobility Overal bed mobility: Needs Assistance Bed Mobility: Sit to Supine       Sit to supine: Min  guard   General bed mobility comments: cues for LE movements and repositioning    Transfers Overall transfer level: Needs assistance Equipment used: Rolling walker (2 wheels), None Transfers: Sit to/from Stand Sit to Stand: Min assist, Min guard           General transfer comment: minG with use of RW after reps, minA to rise and max cues fro extension at hips without UE support. assist at L LE to prevent knee hyperextension    Ambulation/Gait Ambulation/Gait assistance: Min assist, Mod assist Gait Distance (Feet): 45 Feet Assistive device: Rolling walker (2 wheels) Gait Pattern/deviations: Step-to pattern, Decreased step length - right, Decreased stance time - left, Decreased dorsiflexion - left, Knee hyperextension - left Gait velocity: decreased Gait velocity interpretation: <1.31 ft/sec, indicative of household ambulator   General Gait Details: L leg snapping into hyperextension with each step, pt able to correct with cues but unable to maintain without direct cues. cues for RW proximity, reduced DF of LLE with fatigue    Modified Rankin (Stroke Patients Only) Modified Rankin (Stroke Patients Only) Pre-Morbid Rankin Score: No symptoms Modified Rankin: Moderately severe disability     Balance Overall balance assessment: Needs assistance Sitting-balance support: No upper extremity supported, Feet supported Sitting balance-Leahy Scale: Poor     Standing balance support: Bilateral upper extremity supported, Reliant on assistive device for balance Standing balance-Leahy Scale: Fair Standing balance comment: can static stand without UE support, BUE support for gait with minA                            Cognition Arousal/Alertness: Awake/alert Behavior During Therapy: WFL for tasks assessed/performed Overall Cognitive Status: Impaired/Different from baseline Area of  Impairment: Safety/judgement, Awareness                         Safety/Judgement:  Decreased awareness of deficits Awareness: Emergent   General Comments: pt with poor safety awareness and awareness of deficits. cues to attend to positioning of LLE, rationale behind alarm in chair and bed        Exercises Other Exercises Other Exercises: repeated sit-stand x10 with assist at L knee to prevent hyperextension, cues for RUE placement Other Exercises: supine bridges x 5    General Comments General comments (skin integrity, edema, etc.): VSS on RA      Pertinent Vitals/Pain Pain Assessment Pain Assessment: No/denies pain     PT Goals (current goals can now be found in the care plan section) Acute Rehab PT Goals Patient Stated Goal: go home alone PT Goal Formulation: With patient Time For Goal Achievement: 07/24/22 Potential to Achieve Goals: Good Progress towards PT goals: Progressing toward goals    Frequency    Min 4X/week      PT Plan Current plan remains appropriate       AM-PAC PT "6 Clicks" Mobility   Outcome Measure  Help needed turning from your back to your side while in a flat bed without using bedrails?: A Little Help needed moving from lying on your back to sitting on the side of a flat bed without using bedrails?: A Little Help needed moving to and from a bed to a chair (including a wheelchair)?: A Little Help needed standing up from a chair using your arms (e.g., wheelchair or bedside chair)?: A Little Help needed to walk in hospital room?: A Lot Help needed climbing 3-5 steps with a railing? : Total 6 Click Score: 15    End of Session Equipment Utilized During Treatment: Gait belt Activity Tolerance: Patient tolerated treatment well Patient left: in bed;with call bell/phone within reach;with bed alarm set Nurse Communication: Mobility status PT Visit Diagnosis: Hemiplegia and hemiparesis Hemiplegia - Right/Left: Left Hemiplegia - dominant/non-dominant: Non-dominant Hemiplegia - caused by: Cerebral infarction     Time:  2683-4196 PT Time Calculation (min) (ACUTE ONLY): 19 min  Charges:  $Therapeutic Exercise: 8-22 mins                     West Carbo, PT, DPT   Acute Rehabilitation Department   Sandra Cockayne 07/11/2022, 4:08 PM
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
## 2022-07-11 NOTE — Plan of Care (Signed)
  Problem: Education: Goal: Knowledge of General Education information will improve Description: Including pain rating scale, medication(s)/side effects and non-pharmacologic comfort measures Outcome: Progressing   Problem: Health Behavior/Discharge Planning: Goal: Ability to manage health-related needs will improve Outcome: Progressing   Problem: Clinical Measurements: Goal: Ability to maintain clinical measurements within normal limits will improve Outcome: Progressing Goal: Will remain free from infection Outcome: Progressing Goal: Diagnostic test results will improve Outcome: Progressing Goal: Respiratory complications will improve Outcome: Progressing Goal: Cardiovascular complication will be avoided Outcome: Progressing   Problem: Activity: Goal: Risk for activity intolerance will decrease Outcome: Progressing   Problem: Nutrition: Goal: Adequate nutrition will be maintained Outcome: Progressing   Problem: Coping: Goal: Level of anxiety will decrease Outcome: Progressing   Problem: Elimination: Goal: Will not experience complications related to bowel motility Outcome: Progressing Goal: Will not experience complications related to urinary retention Outcome: Progressing   Problem: Pain Managment: Goal: General experience of comfort will improve Outcome: Progressing   Problem: Safety: Goal: Ability to remain free from injury will improve Outcome: Progressing   Problem: Skin Integrity: Goal: Risk for impaired skin integrity will decrease Outcome: Progressing   Problem: Education: Goal: Knowledge of secondary prevention will improve (SELECT ALL) Outcome: Progressing Goal: Knowledge of patient specific risk factors will improve (INDIVIDUALIZE FOR PATIENT) Outcome: Progressing   Problem: Ischemic Stroke/TIA Tissue Perfusion: Goal: Complications of ischemic stroke/TIA will be minimized Outcome: Progressing   

## 2022-07-11 NOTE — Progress Notes (Signed)
[facility] PROGRESS NOTE   Vincent [name] [id number] DOB: 1946/01/31 DOA: 07/09/2022  PCP: [facility]  Brief History/Interval Summary: 76 y.o. male with medical history significant for HTN, DM2, hypothyroidism.  Patient presented with left leg weakness.  He was found to have an acute stroke.  Initially he left AGAINST MEDICAL ADVICE but then came back the next day due to persistent weakness.    Consultants: Neurology  Procedures:  Transthoracic echocardiogram.   Lower extremity Doppler study.    Subjective/Interval History: Patient feels well.  Continues to have left-sided weakness.  Left leg strength appears to have improved slightly.  No chest pain or shortness of breath.     Assessment/Plan:  Acute stroke Patient has left hemiplegia.  Patient underwent MRI brain and CT angiogram head and neck. Neurology is following. LDL is 109.  Patient noted to be on statin. HbA1c 6.8. Patient was loaded with Plavix.   Neurology recommends aspirin and Plavix for 3 weeks and then Plavix alone.   Echocardiogram showed normal systolic function.  No DVT identified on Doppler studies.  Left lower extremity edema Lower extremity Doppler studies negative for DVT  Diabetes mellitus type 2, controlled HbA1c is 6.8.  Continue SSI.  Essential hypertension Blood pressure is reasonably well controlled  Left upper lobe pulmonary nodules Incidentally noted on CT angiogram.  Patient is aware of these and is followed for the same at the [place].  He gets yearly CT scans for same.  Alcohol abuse Drinks 2 beers a day.  Does not seem to be excessive.  Denies any liquor use.  Placed on CIWA protocol. Thiamine folate multivitamins.  Hyponatremia Urine osmolality was noted to be elevated at 687.  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  He is asymptomatic.  We will place him on salt tablets.  We will check thyroid hormone levels as well as cortisol.   Further management and evaluation in the outpatient setting.  Hypothyroidism Continue home medications.   DVT Prophylaxis: Lovenox Code Status: Full code Family Communication: Discussed with patient Disposition Plan: Patient lives by himself.  Seems to be a good candidate for CIR.  Status is: Inpatient Remains inpatient appropriate because: Acute stroke      Medications: Scheduled:  aspirin EC  81 mg Oral Daily   atorvastatin  40 mg Oral Daily   clopidogrel  75 mg Oral Daily   enoxaparin (LOVENOX) injection  40 mg Subcutaneous [id number]   folic acid  1 mg Oral Daily   levothyroxine  125 mcg Oral Q0600   multivitamin with minerals  1 tablet Oral Daily   nicotine  21 mg Transdermal Daily   thiamine  100 mg Oral Daily   Or   thiamine  100 mg Intravenous Daily   Continuous: [id number] **OR** [DISCONTINUED] acetaminophen (TYLENOL) oral liquid 160 mg/5 mL **OR** [DISCONTINUED] acetaminophen, albuterol, LORazepam **OR** LORazepam, senna-docusate  Antibiotics: Anti-infectives (From admission, onward)    None       Objective:  Vital Signs  Vitals:   07/10/22 1948 07/11/22 0000 07/11/22 0341 07/11/22 0901  BP: 138/69 136/68 135/65 136/75  Pulse: 76 75 74 78  Resp: 18 18 19 20   Temp: 98.1 F (36.7 C) 98.4 F (36.9 C) 98.9 F (37.2 C) 98.2 F (36.8 C)  TempSrc: Oral Oral Oral Oral  SpO2: 93% 93% 93% 92%  Weight:      Height:        Intake/Output Summary (Last 24 hours) at 07/11/2022 1032  Last data filed at 07/11/2022 0409 Gross per 24 hour  Intake --  Output 200 ml  Net -200 ml    Filed Weights   07/09/22 0911 07/10/22 0146  Weight: 74.8 kg 78.3 kg   General appearance: Awake alert.  In no distress Resp: Clear to auscultation bilaterally.  Normal effort Cardio: S1-S2 is normal regular.  No S3-S4.  No rubs murmurs or bruit GI: Abdomen is soft.  Nontender nondistended.  Bowel sounds are present normal.  No masses organomegaly Extremities: No edema.   Full range of motion of lower extremities. Neurologic: Alert and oriented x3.  Left hemiparesis     Lab Results:  Data Reviewed: I have personally reviewed following labs and reports of the imaging studies  CBC: Recent Labs  Lab 07/08/22 0930 07/08/22 0944 07/09/22 0935 07/10/22 0510 07/11/22 0443  WBC 12.3*  --  12.7* 13.1* 13.6*  NEUTROABS 7.9*  --   --  9.3*  --   HGB 17.6* 17.3* 16.5 16.2 15.5  HCT 49.4 51.0 46.3 43.6 42.9  MCV 89.5  --  89.0 87.6 88.6  PLT 259  --  206 207 208     Basic Metabolic Panel: Recent Labs  Lab 07/09/22 0935 07/10/22 0510 07/10/22 0755 07/10/22 1502 07/11/22 0443  NA 128* 127* 129* 129* 129*  K 4.1 3.7 3.8 3.7 3.6  CL 95* 92* 95* 93* 94*  CO2 25 25 31 23 23   GLUCOSE 177* 163* 149* 137* 155*  BUN 13 11 12 14 16   CREATININE 0.80 0.81 0.93 0.90 0.89  CALCIUM 9.3 8.8* 8.7* 9.3 8.6*  PHOS  --  3.0 2.9 2.8  --      GFR: Estimated Creatinine Clearance: 69.4 mL/min (by C-G formula based on SCr of 0.89 mg/dL).  Liver Function Tests: Recent Labs  Lab 07/08/22 0930 07/10/22 0510 07/10/22 0755 07/10/22 1502  AST 26  --   --   --   ALT 29  --   --   --   ALKPHOS 69  --   --   --   BILITOT 0.8  --   --   --   PROT 7.5  --   --   --   ALBUMIN 4.2 3.8 3.6 4.0      Coagulation Profile: Recent Labs  Lab 07/08/22 0930  INR 1.0     HbA1C: Recent Labs    07/10/22 0510  HGBA1C 6.8*     CBG: Recent Labs  Lab 07/08/22 0932 07/10/22 0204  GLUCAP 235* 185*     Lipid Profile: Recent Labs    07/10/22 0510  CHOL 170  HDL 40*  LDLCALC 109*  TRIG 104  CHOLHDL 4.3      Radiology Studies: ECHOCARDIOGRAM COMPLETE  Result Date: 07/10/2022    ECHOCARDIOGRAM REPORT   Patient Name:   Soul Companion Date of Exam: 07/10/2022 Medical Rec #:  [id number]    Height:       68.0 in Accession #:    [id number]   Weight:       172.6 lb Date of Birth:  [birth year]     BSA:          1.920 m Patient Age:    45 years     BP:            155/70 mmHg Patient Gender: M            HR:           82 bpm.  Exam Location:  Inpatient Procedure: 2D Echo, Cardiac Doppler and Color Doppler Indications:    Stroke  History:        Patient has no prior history of Echocardiogram examinations.                 Risk Factors:Hypertension, Diabetes and Dyslipidemia.  Sonographer:    Clayton Lefort RDCS (AE) Referring Phys: Jonnie Finner  Sonographer Comments: Technically difficult study due to poor echo windows. IMPRESSIONS  1. Left ventricular ejection fraction, by estimation, is 60 to 65%. The left ventricle has normal function. The left ventricle has no regional wall motion abnormalities. There is moderate left ventricular hypertrophy. Left ventricular diastolic parameters were normal.  2. Right ventricular systolic function is normal. The right ventricular size is normal. Tricuspid regurgitation signal is inadequate for assessing PA pressure.  3. The mitral valve is normal in structure. Trivial mitral valve regurgitation. No evidence of mitral stenosis.  4. The aortic valve is grossly normal. Aortic valve regurgitation is not visualized. No aortic stenosis is present.  5. The inferior vena cava is normal in size with greater than 50% respiratory variability, suggesting right atrial pressure of 3 mmHg.  6. Cannot exclude a small PFO. 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 Left ventricular ejection fraction, by estimation, is 60 to 65%. The left ventricle has normal function. The left ventricle has no regional wall motion abnormalities. The left ventricular internal cavity size was normal in size. There is moderate left ventricular hypertrophy. Left ventricular diastolic parameters were normal. Right Ventricle: The right ventricular size is normal. No increase in right ventricular wall thickness. Right ventricular systolic function is normal. Tricuspid regurgitation signal is inadequate for assessing PA pressure. Left Atrium: Left atrial size  was normal in size. Right Atrium: Right atrial size was normal in size. Pericardium: There is no evidence of pericardial effusion. Presence of epicardial fat layer. Mitral Valve: Chordal SAM. The mitral valve is normal in structure. Trivial mitral valve regurgitation. No evidence of mitral valve stenosis. Tricuspid Valve: The tricuspid valve is normal in structure. Tricuspid valve regurgitation is trivial. No evidence of tricuspid stenosis. Aortic Valve: The aortic valve is grossly normal. Aortic valve regurgitation is not visualized. No aortic stenosis is present. Aortic valve mean gradient measures 5.0 mmHg. Aortic valve peak gradient measures 7.8 mmHg. Aortic valve area, by VTI measures 3.36 cm. Pulmonic Valve: The pulmonic valve was not well visualized. Pulmonic valve regurgitation is not visualized. No evidence of pulmonic stenosis. Aorta: The aortic root is normal in size and structure. Venous: The inferior vena cava is normal in size with greater than 50% respiratory variability, suggesting right atrial pressure of 3 mmHg. IAS/Shunts: There is redundancy of the interatrial septum. Cannot exclude a small PFO.  LEFT VENTRICLE PLAX 2D LVIDd:         4.40 cm   Diastology LVIDs:         2.70 cm   LV e' medial:    8.49 cm/s LV PW:         1.30 cm   LV E/e' medial:  8.7 LV IVS:        1.40 cm   LV e' lateral:   9.25 cm/s LVOT diam:     2.20 cm   LV E/e' lateral: 8.0 LV SV:         102 LV SV Index:   53 LVOT Area:     3.80 cm  RIGHT VENTRICLE RV Basal diam:  2.90 cm RV S prime:     16.20 cm/s TAPSE (M-mode): 2.6 cm LEFT ATRIUM             Index        RIGHT ATRIUM           Index LA diam:        3.40 cm 1.77 cm/m   RA Area:     15.10 cm LA Vol (A2C):   67.4 ml 35.11 ml/m  RA Volume:   37.50 ml  19.53 ml/m LA Vol (A4C):   18.2 ml 9.48 ml/m LA Biplane Vol: 38.7 ml 20.16 ml/m  AORTIC VALVE AV Area (Vmax):    4.02 cm AV Area (Vmean):   3.62 cm AV Area (VTI):     3.36 cm AV Vmax:           140.00 cm/s AV Vmean:           102.000 cm/s AV VTI:            0.304 m AV Peak Grad:      7.8 mmHg AV Mean Grad:      5.0 mmHg LVOT Vmax:         148.00 cm/s LVOT Vmean:        97.200 cm/s LVOT VTI:          0.269 m LVOT/AV VTI ratio: 0.88  AORTA Ao Root diam: 3.40 cm Ao Asc diam:  3.00 cm MITRAL VALVE MV Area (PHT): 2.69 cm    SHUNTS MV Decel Time: 282 msec    Systemic VTI:  0.27 m MV E velocity: 73.90 cm/s  Systemic Diam: 2.20 cm MV A velocity: 86.20 cm/s MV E/A ratio:  0.86 Cherlynn Kaiser MD Electronically signed by Cherlynn Kaiser MD Signature Date/Time: 07/10/2022/12:20:26 PM    Final    VAS [place] LOWER EXTREMITY VENOUS (DVT)  Result Date: 07/10/2022  Lower Venous DVT Study Patient Name:  [facility]IJON [facility]  Date of Exam:   07/10/2022 Medical Rec #: [id number]     Accession #:    [id number] Date of Birth: 18-Mar-1946      Patient Gender: M Patient Age:   16 years Exam Location:  [facility] Procedure:      VAS [place] LOWER EXTREMITY VENOUS (DVT) Referring Phys: Cherylann Ratel --------------------------------------------------------------------------------  Indications: Stroke.  Comparison Study: No previous exams Performing Technologist: Jody Hill RVT, RDMS  Examination Guidelines: A complete evaluation includes B-mode imaging, spectral Doppler, color Doppler, and power Doppler as needed of all accessible portions of each vessel. Bilateral testing is considered an integral part of a complete examination. Limited examinations for reoccurring indications may be performed as noted. The reflux portion of the exam is performed with the patient in reverse Trendelenburg.  +---------+---------------+---------+-----------+----------+--------------+ RIGHT    CompressibilityPhasicitySpontaneityPropertiesThrombus Aging +---------+---------------+---------+-----------+----------+--------------+ CFV      Full           Yes      Yes                                 +---------+---------------+---------+-----------+----------+--------------+  SFJ      Full                                                        +---------+---------------+---------+-----------+----------+--------------+  FV Prox  Full           Yes      Yes                                 +---------+---------------+---------+-----------+----------+--------------+ FV Mid   Full           Yes      Yes                                 +---------+---------------+---------+-----------+----------+--------------+ FV DistalFull           Yes      Yes                                 +---------+---------------+---------+-----------+----------+--------------+ PFV      Full                                                        +---------+---------------+---------+-----------+----------+--------------+ POP      Full           Yes      Yes                                 +---------+---------------+---------+-----------+----------+--------------+ PTV      Full                                                        +---------+---------------+---------+-----------+----------+--------------+ PERO     Full                                                        +---------+---------------+---------+-----------+----------+--------------+   +---------+---------------+---------+-----------+----------+--------------+ LEFT     CompressibilityPhasicitySpontaneityPropertiesThrombus Aging +---------+---------------+---------+-----------+----------+--------------+ CFV      Full           Yes      Yes                                 +---------+---------------+---------+-----------+----------+--------------+ SFJ      Full                                                        +---------+---------------+---------+-----------+----------+--------------+ FV Prox  Full           Yes      Yes                                 +---------+---------------+---------+-----------+----------+--------------+ FV Mid   Full  Yes      Yes                                  +---------+---------------+---------+-----------+----------+--------------+ FV DistalFull           Yes      Yes                                 +---------+---------------+---------+-----------+----------+--------------+ PFV      Full                                                        +---------+---------------+---------+-----------+----------+--------------+ POP      Full           Yes      Yes                                 +---------+---------------+---------+-----------+----------+--------------+ PTV      Full                                                        +---------+---------------+---------+-----------+----------+--------------+ PERO     Full                                                        +---------+---------------+---------+-----------+----------+--------------+     Summary: BILATERAL: - No evidence of deep vein thrombosis seen in the lower extremities, bilaterally. -No evidence of popliteal cyst, bilaterally.   *See table(s) above for measurements and observations. Electronically signed by Monica Martinez MD on 07/10/2022 at 12:06:14 PM.    Final    DG CHEST PORT 1 VIEW  Result Date: 07/09/2022 CLINICAL DATA:  Weakness, hypertension, asthma EXAM: PORTABLE CHEST 1 VIEW COMPARISON:  03/15/2011 FINDINGS: Normal cardiac and mediastinal contours. No focal pulmonary opacity. No pleural effusion or pneumothorax. No acute osseous abnormality. Status post ACDF. IMPRESSION: No active disease. Electronically Signed   By: Merilyn Baba M.D.   On: 07/09/2022 14:11   MR Brain W and Wo Contrast  Result Date: 07/09/2022 CLINICAL DATA:  Provided history: Neuro deficit, acute, stroke suspected. Weakness in left arm and left leg. EXAM: MRI HEAD WITHOUT AND WITH CONTRAST TECHNIQUE: Multiplanar, multiecho pulse sequences of the brain and surrounding structures were obtained without and with intravenous contrast. CONTRAST:  53mL  GADAVIST GADOBUTROL 1 MMOL/ML IV SOLN COMPARISON:  CT angiogram head/neck 07/08/2022. Non-contrast head CT 07/08/2022. FINDINGS: Intermittently motion degraded examination, somewhat limiting evaluation. Most notably, the coronal T2 sequence is severely motion degraded. Brain: Mild generalized cerebral atrophy. 20 x 8 mm acute infarct within the right corona radiata/lentiform nucleus. Background mild multifocal T2 FLAIR hyperintense signal abnormality within the cerebral white matter, nonspecific but compatible with chronic small vessel ischemic disease. No evidence of an intracranial mass. No extra-axial fluid collection. No midline shift. No pathologic intracranial  enhancement identified. Vascular: Maintained flow voids within the proximal large arterial vessels. Skull and upper cervical spine: No focal suspicious marrow lesion. Susceptibility artifact arising from ACDF hardware. Sinuses/Orbits: No mass or acute finding within the imaged orbits. Complete T2 hyperintense opacification of the right frontal sinus. Mild mucosal thickening within the left frontal sinus. Moderate mucosal thickening within the bilateral ethmoid air cells. Mild mucosal thickening within the bilateral sphenoid and maxillary sinuses. IMPRESSION: 20 x 8 mm acute infarct within the right corona radiata/basal ganglia. Background mild chronic small vessel ischemic disease within the cerebral white matter. Mild generalized cerebral atrophy. Electronically Signed   By: Kellie Simmering D.O.   On: 07/09/2022 12:10       LOS: 2 days   [facility] Pager on [email or web address]  07/11/2022, 10:32 AM

## 2022-07-12 LAB — BASIC METABOLIC PANEL
Anion gap: 12 (ref 5–15)
BUN: 18 mg/dL (ref 8–23)
CO2: 25 mmol/L (ref 22–32)
Calcium: 8.8 mg/dL — ABNORMAL LOW (ref 8.9–10.3)
Chloride: 97 mmol/L — ABNORMAL LOW (ref 98–111)
Creatinine, Ser: 0.81 mg/dL (ref 0.61–1.24)
GFR, Estimated: >60 mL/min (ref >60)
Glucose, Bld: 164 mg/dL — ABNORMAL HIGH (ref 70–99)
Potassium: 3.9 mmol/L (ref 3.5–5.1)
Sodium: 134 mmol/L — ABNORMAL LOW (ref 135–145)

## 2022-07-12 LAB — MAGNESIUM: Magnesium: 1.8 mg/dL (ref 1.7–2.4)

## 2022-07-12 LAB — T4, FREE: Free T4: 0.97 ng/dL (ref 0.61–1.12)

## 2022-07-12 LAB — TSH: TSH: 4.224 u[IU]/mL (ref 0.350–4.500)

## 2022-07-12 LAB — CORTISOL: Cortisol, Plasma: 15.5 ug/dL

## 2022-07-12 MED ORDER — SODIUM CHLORIDE 1 G PO TABS
1.0000 g | ORAL_TABLET | Freq: Two times a day (BID) | ORAL | Status: DC
  Administered 2022-07-12 – 2022-07-13 (×2): 1 g via ORAL
  Filled 2022-07-12 (×2): qty 1

## 2022-07-12 NOTE — PMR Pre-admission (Signed)
PMR Admission Coordinator Pre-Admission Assessment  Patient: Vincent [name] is an 76 y.o., male MRN: [id number] DOB: 06/09/1946 Height: 5' 8" (172.7 cm) Weight: 78.3 kg  Insurance Information HMO: ***    PPO: ***     PCP: ***     IPA: ***     80/20: ***     OTHER: *** PRIMARY: [place]      Policy#: [id number]      Subscriber: patient CM Name: ***      Phone#: ***     Fax#: *** Pre-Cert#: ***      Employer: *** Benefits:  Phone #: ***     Name: *** Irene Shipper. Date: ***     Deduct: ***      Out of Pocket Max: ***      Life Max: *** CIR: ***      SNF: *** Outpatient: ***     Co-Pay: *** Home Health: ***      Co-Pay: *** DME: ***     Co-Pay: *** Providers: *** SECONDARY: ***      Policy#: ***     Phone#: ***  Financial Counselor: ***      Phone#: ***  The "Data Collection Information Summary" for patients in Inpatient Rehabilitation Facilities with attached "Privacy Act Croom Records" was provided and verbally reviewed with: Patient and Family  Emergency Contact Information Contact Information     Name Relation Home Work Mobile   Youngman,Connie Other [phone]     Mensinger,Bobby Brother [phone]         Current Medical History  Patient Admitting Diagnosis: CVA History of Present Illness: Vincent [name] is a 76 y.o. male with medical history significant of HTN, DM2, hypothyroidism. Presenting to [facility] ED 07/09/22 with left leg weakness. He was in his normal state of health until yesterday morning around 0330hrs. He reports that he had some weakness in his left arm and leg. He didn't have any CP, palpitations, or syncope. He became concerned and came to the ED for evaluation. After being notified he would need to be admitted for CVA w/u, the patient declined and left AMA. He says he was fine at home until early this morning, when he could no longer move his left left. He became concerned again and came back to the ED for evaluation. He denies any other aggravating  or alleviating factors. 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 CTA head and neck 07/08/22 No emergent large vessel occlusion. Intracranial atherosclerosis including moderate left and mild right ICA stenoses, mild distal vertebral and basilar artery stenoses, and a severe left P2 stenosis.  Widely patent cervical carotid arteries.  Two left upper lobe pulmonary nodules measuring up to 6 mm. 2D Echo EF 60 to 65% LE venous Doppler no DVT. Therapies are recommending intensive rehab.  Complete NIHSS TOTAL: 2  Patient's medical record from [facility] has been reviewed by the rehabilitation admission coordinator and physician.  Past Medical History  Past Medical History:  Diagnosis Date   Asthma    Depression    Diabetes mellitus    Hypertension    Numbness    Stroke [facility]    Suicide and self-inflicted injury ([place]) Aug, 2012   cut wrist    Has the patient had major surgery during 100 days prior to admission? No  Family History   family history is not on file.  Current Medications  Current Facility-Administered Medications:    acetaminophen (TYLENOL) tablet 650 mg, 650 mg, Oral, Q4H  PRN **OR** [DISCONTINUED] acetaminophen (TYLENOL) 160 MG/5ML solution 650 mg, 650 mg, Per Tube, Q4H PRN **OR** [DISCONTINUED] acetaminophen (TYLENOL) suppository 650 mg, 650 mg, Rectal, Q4H PRN, Marylyn Ishihara, Tyrone A, DO   albuterol (PROVENTIL) (2.5 MG/3ML) 0.083% nebulizer solution 3 mL, 3 mL, Inhalation, Q6H PRN, Marylyn Ishihara, Tyrone A, DO   aspirin EC tablet 81 mg, 81 mg, Oral, Daily, Bonnielee Haff, MD, 81 mg at 07/12/22 0819   atorvastatin (LIPITOR) tablet 40 mg, 40 mg, Oral, Daily, Rosalin Hawking, MD, 40 mg at 07/12/22 5701   [COMPLETED] clopidogrel (PLAVIX) tablet 300 mg, 300 mg, Oral, Once, 300 mg at 07/09/22 2119 **FOLLOWED BY** clopidogrel (PLAVIX) tablet 75 mg, 75 mg, Oral, Daily, Bhagat, Srishti L, MD, 75 mg at 07/12/22 0819   enoxaparin (LOVENOX) injection 40 mg, 40 mg, Subcutaneous, Q24H, Bonnielee Haff, MD, 40 mg at 77/93/90 3009   folic acid (FOLVITE) tablet 1 mg, 1 mg, Oral, Daily, Kyle, Tyrone A, DO, 1 mg at 07/12/22 2330   levothyroxine (SYNTHROID) tablet 125 mcg, 125 mcg, Oral, Q0600, Marylyn Ishihara, Tyrone A, DO, 125 mcg at 07/12/22 0600   LORazepam (ATIVAN) tablet 1-4 mg, 1-4 mg, Oral, Q1H PRN **OR** LORazepam (ATIVAN) injection 1-4 mg, 1-4 mg, Intravenous, Q1H PRN, Marylyn Ishihara, Tyrone A, DO   multivitamin with minerals tablet 1 tablet, 1 tablet, Oral, Daily, Kyle, Tyrone A, DO, 1 tablet at 07/12/22 0762   nicotine (NICODERM CQ - dosed in mg/24 hours) patch 21 mg, 21 mg, Transdermal, Daily, Kyle, Tyrone A, DO, 21 mg at 07/12/22 0820   senna-docusate (Senokot-S) tablet 1 tablet, 1 tablet, Oral, QHS PRN, Marylyn Ishihara, Tyrone A, DO   sodium chloride tablet 1 g, 1 g, Oral, BID WC, Bonnielee Haff, MD   thiamine (VITAMIN B1) tablet 100 mg, 100 mg, Oral, Daily, 100 mg at 07/12/22 0819 **OR** thiamine (VITAMIN B1) injection 100 mg, 100 mg, Intravenous, Daily, Kyle, Tyrone A, DO  Patients Current Diet:  Diet Order             Diet Carb Modified Fluid consistency: Thin; Room service appropriate? Yes; Fluid restriction: 1500 mL Fluid  Diet effective now                   Precautions / Restrictions Precautions Precautions: Fall Precaution Comments: ataxic Restrictions Weight Bearing Restrictions: No   Has the patient had 2 or more falls or a fall with injury in the past year? No  Prior Activity Level Community (5-7x/wk): independent  Prior Functional Level Self Care: Did the patient need help bathing, dressing, using the toilet or eating? Independent  Indoor Mobility: Did the patient need assistance with walking from room to room (with or without device)? Independent  Stairs: Did the patient need assistance with internal or external stairs (with or without device)? Independent  Functional Cognition: Did the patient need help planning regular tasks such as shopping or remembering to take  medications? Independent  Patient Information Are you of Hispanic, Latino/a,or Spanish origin?: A. No, not of Hispanic, Latino/a, or Spanish origin What is your race?: A. White Do you need or want an interpreter to communicate with a doctor or health care staff?: 0. No  Patient's Response To:  Health Literacy and Transportation Is the patient able to respond to health literacy and transportation needs?: Yes Health Literacy - How often do you need to have someone help you when you read instructions, pamphlets, or other written material from your doctor or pharmacy?: Rarely In the past 12 months, has lack of transportation  kept you from medical appointments or from getting medications?: No In the past 12 months, has lack of transportation kept you from meetings, work, or from getting things needed for daily living?: No  Development worker, international aid / [place] Devices/Equipment: None Home Equipment: None  Prior Device Use: Indicate devices/aids used by the patient prior to current illness, exacerbation or injury? None of the above  Current Functional Level Cognition  Arousal/Alertness: Awake/alert Overall Cognitive Status: Within Functional Limits for tasks assessed Orientation Level: Oriented X4 Safety/Judgement: Decreased awareness of deficits General Comments: emerging safety awareness, however requires cues when navigating (turning with walker in the hallway) Memory: Appears intact Awareness: Impaired Awareness Impairment: Anticipatory impairment Problem Solving: Appears intact Safety/Judgment: Other (comment) Comments: Patient does not appear patient with staying too long "I'll give them a week" and this seems to result in him perhaps downplaying his deficits even to himself    Extremity Assessment (includes Sensation/Coordination)  Upper Extremity Assessment: LUE deficits/detail LUE Deficits / Details: ataxic. Undershooting/overshooting. Difficulty with coordination,  Dysmetria, dysdiadokinesia slowed as compared to R. LUE drift with bil shoulder flexion. Poor coordination and dexterity to don socks.  Lower Extremity Assessment: Defer to PT evaluation LLE Deficits / Details: hip 4/5, knee 3/5, ankle DF 2+ LLE Sensation: WNL LLE Coordination: decreased gross motor, decreased fine motor    ADLs  Overall ADL's : Needs assistance/impaired Eating/Feeding: Minimal assistance Eating/Feeding Details (indicate cue type and reason): min A for bil tasks (cutting meat, etc) Grooming: Oral care, Set up, Sitting Grooming Details (indicate cue type and reason): Pt using problem solving to overcome LUE incoordination during oral care. Upper Body Bathing: Set up, Sitting Lower Body Bathing: Minimal assistance, Sit to/from stand Upper Body Dressing : Minimal assistance Lower Body Dressing: Minimal assistance, Sitting/lateral leans Lower Body Dressing Details (indicate cue type and reason): Min A. Pt doffing L sock with significantly increased time, but unable to don due to poor coordination and dexterity as well as inability to perform figure 4 or adequately reach down to feet. Pt donning and doffing R sock with significantly incresaed time. Able to perform modified figure 4 Toilet Transfer: Minimal assistance, Moderate assistance, Stand-pivot, BSC/3in1, Rolling walker (2 wheels) Toilet Transfer Details (indicate cue type and reason): Max multimodal cues to coordinate RW and BLE. Toileting- Clothing Manipulation and Hygiene: Moderate assistance, Sit to/from stand Functional mobility during ADLs: Minimal assistance, Moderate assistance, Rolling walker (2 wheels) General ADL Comments: Min-mod A for functional mobility. Up to max cues needed with fatigue    Mobility  Overal bed mobility: Needs Assistance Bed Mobility: Sit to Supine Supine to sit: Min guard Sit to supine: Min guard General bed mobility comments: up in chair upon OT entry    Transfers  Overall transfer  level: Needs assistance Equipment used: Rolling walker (2 wheels) Transfers: Sit to/from Stand Sit to Stand: Min guard Bed to/from chair/wheelchair/BSC transfer type:: Step pivot Step pivot transfers: Mod assist General transfer comment: min gaurd to rise, up to mod A when completing functional ambulation due to L knee buckling    Ambulation / Gait / Stairs / Wheelchair Mobility  Ambulation/Gait Ambulation/Gait assistance: Min assist, Mod assist Gait Distance (Feet): 45 Feet Assistive device: Rolling walker (2 wheels) Gait Pattern/deviations: Step-to pattern, Decreased step length - right, Decreased stance time - left, Decreased dorsiflexion - left, Knee hyperextension - left General Gait Details: L leg snapping into hyperextension with each step, pt able to correct with cues but unable to maintain without direct cues. cues  for RW proximity, reduced DF of LLE with fatigue Gait velocity: decreased Gait velocity interpretation: <1.31 ft/sec, indicative of household ambulator    Posture / Balance Dynamic Sitting Balance Sitting balance - Comments: Min guard A when reaching toward feet, however min A when attempting to perform figure 4 as trunk observed to lean backward Balance Overall balance assessment: Needs assistance Sitting-balance support: No upper extremity supported, Feet supported Sitting balance-Leahy Scale: Fair Sitting balance - Comments: Min guard A when reaching toward feet, however min A when attempting to perform figure 4 as trunk observed to lean backward Standing balance support: Bilateral upper extremity supported, Reliant on assistive device for balance Standing balance-Leahy Scale: Poor Standing balance comment: reliant on RW for support    Special needs/care consideration Skin intact   Previous Home Environment (from acute therapy documentation) Living Arrangements: Alone  Lives With: Alone Available Help at Discharge: Family, Available PRN/intermittently Type of  Home: Apartment Home Layout: One level Home Access: Level entry Bathroom Shower/Tub: Multimedia programmer: [place]: No  Discharge Living Setting Plans for Discharge Living Setting: Patient's home, Alone Type of Home at Discharge: Apartment Discharge Home Layout: One level Discharge Home Access: Level entry Discharge Bathroom Shower/Tub: Walk-in shower Discharge Bathroom Toilet: Standard Does the patient have any problems obtaining your medications?: No  Social/Family/Support Systems Anticipated Caregiver: Brother, Mortimer Fries prn Anticipated Caregiver's Contact Information: [phone] Caregiver Availability: Intermittent Discharge Plan Discussed with Primary Caregiver: Yes Is Caregiver In Agreement with Plan?: Yes Does Caregiver/Family have Issues with Lodging/Transportation while Pt is in Rehab?: No  Goals Patient/Family Goal for Rehab: mod I PT, OT Expected length of stay: 7-10 days Pt/Family Agrees to Admission and willing to participate: Yes Program Orientation Provided & Reviewed with Pt/Caregiver Including Roles  & Responsibilities: Yes  Barriers to Discharge: Insurance for SNF coverage  Decrease burden of Care through IP rehab admission: Othern/a  Possible need for SNF placement upon discharge: not anticipated  Patient Condition: I have reviewed medical records from [facility], spoken with  [facility] , and patient and family member. I met with patient at the bedside and discussed via phone for inpatient rehabilitation assessment.  Patient will benefit from ongoing PT and OT, can actively participate in 3 hours of therapy a day 5 days of the week, and can make measurable gains during the admission.  Patient will also benefit from the coordinated team approach during an Inpatient Acute Rehabilitation admission.  The patient will receive intensive therapy as well as Rehabilitation physician, nursing, social worker, and care management interventions.  Due to  safety, skin/wound care, disease management, medication administration, and patient education the patient requires 24 hour a day rehabilitation nursing.  The patient is currently minA with mobility and basic ADLs.  Discharge setting and therapy post discharge at home with home health is anticipated.  Patient has agreed to participate in the Acute Inpatient Rehabilitation Program and will admit ***.  Preadmission Screen Completed By:  Nelly Laurence, 07/12/2022 1:21 PM ______________________________________________________________________   Discussed status with Dr. Marland Kitchen on *** at *** and received approval for admission today.  Admission Coordinator:  Nelly Laurence, time ***/Date ***   Assessment/Plan: Diagnosis: Does the need for close, 24 hr/day Medical supervision in concert with the patient's rehab needs make it unreasonable for this patient to be served in a less intensive setting? {yes_no_potentially:3041433} Co-Morbidities requiring supervision/potential complications: *** Due to {due NG:2952841}, does the patient require 24 hr/day rehab nursing? {yes_no_potentially:3041433} Does the patient require coordinated  care of a physician, rehab nurse, PT, OT, and SLP to address physical and functional deficits in the context of the above medical diagnosis(es)? {yes_no_potentially:3041433} Addressing deficits in the following areas: {deficits:3041436} Can the patient actively participate in an intensive therapy program of at least 3 hrs of therapy 5 days a week? {yes_no_potentially:3041433} The potential for patient to make measurable gains while on inpatient rehab is {potential:3041437} Anticipated functional outcomes upon discharge from inpatient rehab: {functional outcomes:304600100} PT, {functional outcomes:304600100} OT, {functional outcomes:304600100} SLP Estimated rehab length of stay to reach the above functional goals is: *** Anticipated discharge destination: {anticipated dc  setting:21604} 10. Overall Rehab/Functional Prognosis: {potential:3041437}   MD Signature: ***

## 2022-07-12 NOTE — Progress Notes (Signed)
[facility] PROGRESS NOTE   Vincent [name] OMB:[id number] DOB: [birth year] DOA: 07/09/2022  PCP: [facility]  Brief History/Interval Summary: 76 y.o. male with medical history significant for HTN, DM2, hypothyroidism.  Patient presented with left leg weakness.  He was found to have an acute stroke.  Initially he left AGAINST MEDICAL ADVICE but then came back the next day due to persistent weakness.    Consultants: Neurology  Procedures:  Transthoracic echocardiogram.   Lower extremity Doppler study.    Subjective/Interval History: Patient mentions that the weakness in his left arm and leg are only slightly better this morning.  No other complaints offered.     Assessment/Plan:  Acute stroke Patient has left hemiplegia.  Patient underwent MRI brain and CT angiogram head and neck. Patient was seen by neurology. LDL is 109.  Patient noted to be on statin. HbA1c 6.8. Patient was loaded with Plavix.   Neurology recommends aspirin and Plavix for 3 weeks and then Plavix alone.   Echocardiogram showed normal systolic function.  No DVT identified on Doppler studies. Seen by PT and OT.  Inpatient rehabilitation is recommended.  Left lower extremity edema Lower extremity Doppler studies negative for DVT  Diabetes mellitus type 2, controlled HbA1c is 6.8.  Continue SSI.  Essential hypertension High blood pressure readings noted over the last 24 hours likely due to salt tablets.  Left upper lobe pulmonary nodules Incidentally noted on CT angiogram.  Patient is aware of these and is followed for the same at the [place].  He gets yearly CT scans for same.  Alcohol abuse Drinks 2 beers a day.  Does not seem to be excessive.  Denies any liquor use.  Placed on CIWA protocol. Thiamine folate multivitamins.  Hyponatremia Levels noted to be within the upper 120s.  Urine osmolality was noted to be elevated at 687.  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  He is asymptomatic.   Improved with salt tablets.  We will cut back on the dose of the salt tablets since his blood pressure noted to be elevated. Thyroid hormone levels and cortisol levels normal.   Further management and evaluation in the outpatient setting.  Hypothyroidism Continue home medications.  TSH and free T4 levels are normal.   DVT Prophylaxis: Lovenox Code Status: Full code Family Communication: Discussed with patient Disposition Plan: Patient lives by himself.  Seems to be a good candidate for CIR.  Status is: Inpatient Remains inpatient appropriate because: Acute stroke      Medications: Scheduled:  aspirin EC  81 mg Oral Daily   atorvastatin  40 mg Oral Daily   clopidogrel  75 mg Oral Daily   enoxaparin (LOVENOX) injection  40 mg Subcutaneous Q24H   folic acid  1 mg Oral Daily   levothyroxine  125 mcg Oral Q0600   multivitamin with minerals  1 tablet Oral Daily   nicotine  21 mg Transdermal Daily   sodium chloride  1 g Oral BID WC   thiamine  100 mg Oral Daily   Or   thiamine  100 mg Intravenous Daily   Continuous: GTX:MIWOEHOZYYQMG **OR** [DISCONTINUED] acetaminophen (TYLENOL) oral liquid 160 mg/5 mL **OR** [DISCONTINUED] acetaminophen, albuterol, LORazepam **OR** LORazepam, senna-docusate  Antibiotics: Anti-infectives (From admission, onward)    None       Objective:  Vital Signs  Vitals:   07/11/22 1922 07/11/22 2309 07/12/22 0343 07/12/22 0834  BP: (!) 160/72 (!) 149/82 (!) 162/77 (!) 155/72  Pulse:  68 68 72 81  Resp: 17 16 16 17   Temp: 98.4 F (36.9 C) 97.9 F (36.6 C) 98.4 F (36.9 C) 98 F (36.7 C)  TempSrc: Oral Oral Oral Oral  SpO2: 94% 93% 91% 96%  Weight:      Height:        Intake/Output Summary (Last 24 hours) at 07/12/2022 1145 Last data filed at 07/12/2022 1007 Gross per 24 hour  Intake 354 ml  Output 650 ml  Net -296 ml    Filed Weights   07/09/22 0911 07/10/22 0146  Weight: 74.8 kg 78.3 kg     General appearance: Awake alert.  In no distress Resp: Clear to auscultation bilaterally.  Normal effort Cardio: S1-S2 is normal regular.  No S3-S4.  No rubs murmurs or bruit GI: Abdomen is soft.  Nontender nondistended.  Bowel sounds are present normal.  No masses organomegaly Extremities: No edema.  Full range of motion of lower extremities. Neurologic: Alert and oriented x3.  Left hemiparesis noted    Lab Results:  Data Reviewed: I have personally reviewed following labs and reports of the imaging studies  CBC: Recent Labs  Lab 07/08/22 0930 07/08/22 0944 07/09/22 0935 07/10/22 0510 07/11/22 0443  WBC 12.3*  --  12.7* 13.1* 13.6*  NEUTROABS 7.9*  --   --  9.3*  --   HGB 17.6* 17.3* 16.5 16.2 15.5  HCT 49.4 51.0 46.3 43.6 42.9  MCV 89.5  --  89.0 87.6 88.6  PLT 259  --  206 207 208     Basic Metabolic Panel: Recent Labs  Lab 07/10/22 0510 07/10/22 0755 07/10/22 1502 07/11/22 0443 07/12/22 0435  NA 127* 129* 129* 129* 134*  K 3.7 3.8 3.7 3.6 3.9  CL 92* 95* 93* 94* 97*  CO2 25 31 23 23 25   GLUCOSE 163* 149* 137* 155* 164*  BUN 11 12 14 16 18   CREATININE 0.81 0.93 0.90 0.89 0.81  CALCIUM 8.8* 8.7* 9.3 8.6* 8.8*  MG  --   --   --   --  1.8  PHOS 3.0 2.9 2.8  --   --      GFR: Estimated Creatinine Clearance: 76.2 mL/min (by C-G formula based on SCr of 0.81 mg/dL).  Liver Function Tests: Recent Labs  Lab 07/08/22 0930 07/10/22 0510 07/10/22 0755 07/10/22 1502  AST 26  --   --   --   ALT 29  --   --   --   ALKPHOS 69  --   --   --   BILITOT 0.8  --   --   --   PROT 7.5  --   --   --   ALBUMIN 4.2 3.8 3.6 4.0      Coagulation Profile: Recent Labs  Lab 07/08/22 0930  INR 1.0     HbA1C: Recent Labs    07/10/22 0510  HGBA1C 6.8*     CBG: Recent Labs  Lab 07/08/22 0932 07/10/22 0204  GLUCAP 235* 185*     Lipid Profile: Recent Labs    07/10/22 0510  CHOL 170  HDL 40*  LDLCALC 109*  TRIG 104  CHOLHDL 4.3       Radiology Studies: ECHOCARDIOGRAM COMPLETE  Result Date: 07/10/2022    ECHOCARDIOGRAM REPORT   Patient Name:   Vincent [name] Date of Exam: 07/10/2022 Medical Rec #:  07/12/2022    Height:       68.0 in Accession #:    Debbora Presto   Weight:  172.6 lb Date of Birth:  04/17/1946     BSA:          1.920 m Patient Age:    67 years     BP:           155/70 mmHg Patient Gender: M            HR:           82 bpm. Exam Location:  Inpatient Procedure: 2D Echo, Cardiac Doppler and Color Doppler Indications:    Stroke  History:        Patient has no prior history of Echocardiogram examinations.                 Risk Factors:Hypertension, Diabetes and Dyslipidemia.  Sonographer:    Clayton Lefort RDCS (AE) Referring Phys: Jonnie Finner  Sonographer Comments: Technically difficult study due to poor echo windows. IMPRESSIONS  1. Left ventricular ejection fraction, by estimation, is 60 to 65%. The left ventricle has normal function. The left ventricle has no regional wall motion abnormalities. There is moderate left ventricular hypertrophy. Left ventricular diastolic parameters were normal.  2. Right ventricular systolic function is normal. The right ventricular size is normal. Tricuspid regurgitation signal is inadequate for assessing PA pressure.  3. The mitral valve is normal in structure. Trivial mitral valve regurgitation. No evidence of mitral stenosis.  4. The aortic valve is grossly normal. Aortic valve regurgitation is not visualized. No aortic stenosis is present.  5. The inferior vena cava is normal in size with greater than 50% respiratory variability, suggesting right atrial pressure of 3 mmHg.  6. Cannot exclude a small PFO. 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 Left ventricular ejection fraction, by estimation, is 60 to 65%. The left ventricle has normal function. The left ventricle has no regional wall motion abnormalities. The left ventricular internal cavity size was normal  in size. There is moderate left ventricular hypertrophy. Left ventricular diastolic parameters were normal. Right Ventricle: The right ventricular size is normal. No increase in right ventricular wall thickness. Right ventricular systolic function is normal. Tricuspid regurgitation signal is inadequate for assessing PA pressure. Left Atrium: Left atrial size was normal in size. Right Atrium: Right atrial size was normal in size. Pericardium: There is no evidence of pericardial effusion. Presence of epicardial fat layer. Mitral Valve: Chordal SAM. The mitral valve is normal in structure. Trivial mitral valve regurgitation. No evidence of mitral valve stenosis. Tricuspid Valve: The tricuspid valve is normal in structure. Tricuspid valve regurgitation is trivial. No evidence of tricuspid stenosis. Aortic Valve: The aortic valve is grossly normal. Aortic valve regurgitation is not visualized. No aortic stenosis is present. Aortic valve mean gradient measures 5.0 mmHg. Aortic valve peak gradient measures 7.8 mmHg. Aortic valve area, by VTI measures 3.36 cm. Pulmonic Valve: The pulmonic valve was not well visualized. Pulmonic valve regurgitation is not visualized. No evidence of pulmonic stenosis. Aorta: The aortic root is normal in size and structure. Venous: The inferior vena cava is normal in size with greater than 50% respiratory variability, suggesting right atrial pressure of 3 mmHg. IAS/Shunts: There is redundancy of the interatrial septum. Cannot exclude a small PFO.  LEFT VENTRICLE PLAX 2D LVIDd:         4.40 cm   Diastology LVIDs:         2.70 cm   LV e' medial:    8.49 cm/s LV PW:  1.30 cm   LV E/e' medial:  8.7 LV IVS:        1.40 cm   LV e' lateral:   9.25 cm/s LVOT diam:     2.20 cm   LV E/e' lateral: 8.0 LV SV:         102 LV SV Index:   53 LVOT Area:     3.80 cm  RIGHT VENTRICLE RV Basal diam:  2.90 cm RV S prime:     16.20 cm/s TAPSE (M-mode): 2.6 cm LEFT ATRIUM             Index        RIGHT  ATRIUM           Index LA diam:        3.40 cm 1.77 cm/m   RA Area:     15.10 cm LA Vol (A2C):   67.4 ml 35.11 ml/m  RA Volume:   37.50 ml  19.53 ml/m LA Vol (A4C):   18.2 ml 9.48 ml/m LA Biplane Vol: 38.7 ml 20.16 ml/m  AORTIC VALVE AV Area (Vmax):    4.02 cm AV Area (Vmean):   3.62 cm AV Area (VTI):     3.36 cm AV Vmax:           140.00 cm/s AV Vmean:          102.000 cm/s AV VTI:            0.304 m AV Peak Grad:      7.8 mmHg AV Mean Grad:      5.0 mmHg LVOT Vmax:         148.00 cm/s LVOT Vmean:        97.200 cm/s LVOT VTI:          0.269 m LVOT/AV VTI ratio: 0.88  AORTA Ao Root diam: 3.40 cm Ao Asc diam:  3.00 cm MITRAL VALVE MV Area (PHT): 2.69 cm    SHUNTS MV Decel Time: 282 msec    Systemic VTI:  0.27 m MV E velocity: 73.90 cm/s  Systemic Diam: 2.20 cm MV A velocity: 86.20 cm/s MV E/A ratio:  0.86 Weston Brass MD Electronically signed by Weston Brass MD Signature Date/Time: 07/10/2022/12:20:26 PM    Final        LOS: 3 days   Osvaldo Shipper  [facility] Pager on [email or web address]  07/12/2022, 11:45 AM

## 2022-07-12 NOTE — Social Work (Signed)
CSW acknowledges consult for substance use counseling and resources. Pt states he does not use any drugs and only has a few beers occasionally. Pt denies any issues with quitting and states he "can come or go with it". Pt declines resources at this time.

## 2022-07-12 NOTE — Progress Notes (Signed)
Occupational Therapy Treatment Patient Details Name: Vincent [name] MRN: [id number] DOB: 1946/01/15 Today's Date: 07/12/2022   History of present illness 76 y.o. male Presenting 07/09/22 with left leg weakness. 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 NIHSS=4; CIWA protocol; doppler LLE negative for DVT. PMH significant of HTN, DM2, shoulder arthroscopy   OT comments  Patient continues to make steady progress towards goals in skilled OT session. Patient's session encompassed functional ambulation to promote increased activity tolerance. Patient continues to demonstrate L leg knee buckling and hyperextension with ambulation, requiring up to mod A. Patient also requiring increased cues for walker management when fatigued or when turning RW. OT continues to recommend AIR due to functional deficits.    Recommendations for follow up therapy are one component of a multi-disciplinary discharge planning process, led by the attending physician.  Recommendations may be updated based on patient status, additional functional criteria and insurance authorization.    Follow Up Recommendations  Acute inpatient rehab (3hours/day)    Assistance Recommended at Discharge Frequent or constant Supervision/Assistance  Patient can return home with the following  A lot of help with walking and/or transfers;A little help with bathing/dressing/bathroom;Assistance with cooking/housework;Direct supervision/assist for medications management;Direct supervision/assist for financial management;Assist for transportation;Help with stairs or ramp for entrance   Equipment Recommendations  Other (comment) (Defer to next venue)    Recommendations for Other Services      Precautions / Restrictions Precautions Precautions: Fall Precaution Comments: ataxic Restrictions Weight Bearing Restrictions: No       Mobility Bed Mobility               General bed mobility comments: up in  chair upon OT entry    Transfers Overall transfer level: Needs assistance Equipment used: Rolling walker (2 wheels) Transfers: Sit to/from Stand Sit to Stand: Min guard           General transfer comment: min gaurd to rise, up to mod A when completing functional ambulation due to L knee buckling     Balance Overall balance assessment: Needs assistance Sitting-balance support: No upper extremity supported, Feet supported Sitting balance-Leahy Scale: Fair     Standing balance support: Bilateral upper extremity supported, Reliant on assistive device for balance Standing balance-Leahy Scale: Poor Standing balance comment: reliant on RW for support                           ADL either performed or assessed with clinical judgement   ADL Overall ADL's : Needs assistance/impaired                                     Functional mobility during ADLs: Minimal assistance;Moderate assistance;Rolling walker (2 wheels) General ADL Comments: Min-mod A for functional mobility. Up to max cues needed with fatigue    Extremity/Trunk Assessment              Vision       Perception     Praxis      Cognition Arousal/Alertness: Awake/alert Behavior During Therapy: WFL for tasks assessed/performed Overall Cognitive Status: No family/caregiver present to determine baseline cognitive functioning Area of Impairment: Safety/judgement, Awareness                         Safety/Judgement: Decreased awareness of deficits Awareness: Emergent   General Comments: emerging safety awareness,  however requires cues when navigating (turning with walker in the hallway)        Exercises      Shoulder Instructions       General Comments      Pertinent Vitals/ Pain       Pain Assessment Pain Assessment: No/denies pain  Home Living                                          Prior Functioning/Environment               Frequency  Min 2X/week        Progress Toward Goals  OT Goals(current goals can now be found in the care plan section)  Progress towards OT goals: Progressing toward goals  Acute Rehab OT Goals Patient Stated Goal: go home OT Goal Formulation: With patient Time For Goal Achievement: 07/24/22 Potential to Achieve Goals: Good ADL Goals Pt Will Perform Lower Body Dressing: Independently;sitting/lateral leans;sit to/from stand Pt Will Transfer to Toilet: Independently;ambulating Pt Will Perform Toileting - Clothing Manipulation and hygiene: Independently Pt/caregiver will Perform Home Exercise Program: With written HEP provided;Increased strength Additional ADL Goal #1: Patient will be able to complete functional task in standing for 3-5 minutes without needing a seated rest break to promote increased activity tolerance.  Plan Discharge plan remains appropriate    Co-evaluation                 AM-PAC OT "6 Clicks" Daily Activity     Outcome Measure   Help from another person eating meals?: None Help from another person taking care of personal grooming?: A Little Help from another person toileting, which includes using toliet, bedpan, or urinal?: A Lot Help from another person bathing (including washing, rinsing, drying)?: A Lot Help from another person to put on and taking off regular upper body clothing?: A Little Help from another person to put on and taking off regular lower body clothing?: A Little 6 Click Score: 17    End of Session Equipment Utilized During Treatment: Gait belt;Rolling walker (2 wheels)  OT Visit Diagnosis: Unsteadiness on feet (R26.81);Muscle weakness (generalized) (M62.81);Other abnormalities of gait and mobility (R26.89);Other symptoms and signs involving cognitive function;Ataxia, unspecified (R27.0)   Activity Tolerance Patient tolerated treatment well   Patient Left in chair;with call bell/phone within reach   Nurse Communication  Mobility status        Time: [id number] OT Time Calculation (min): 15 min  Charges: OT General Charges $OT Visit: 1 Visit OT Treatments $Self Care/Home Management : 8-22 mins  Corinne Ports E. Ahni Bradwell, OTR/L Acute Rehabilitation Services [place] 07/12/2022, 12:38 PM

## 2022-07-12 NOTE — NC FL2 (Signed)
[place] LEVEL OF CARE SCREENING TOOL     IDENTIFICATION  Patient Name: Vincent [name] Birthdate: [birth year] Sex: male Admission Date (Current Location): 07/09/2022  [facility] and [place] Number:      Facility and Address:  [facility], [place] [address], [place], [place] [address]      Provider Number: [id number]  Attending Physician Name and Address:  Bonnielee Haff, MD  Relative Name and Phone Number:       Current Level of Care: Hospital Recommended Level of Care: [place] Prior Approval Number:    Date Approved/Denied:   PASRR Number: [id number]  Discharge Plan: SNF    Current Diagnoses: Patient Active Problem List   Diagnosis Date Noted   Acute CVA (cerebrovascular accident) ([place]) 07/09/2022   HTN (hypertension) 07/09/2022   DM2 (diabetes mellitus, type 2) ([place]) 07/09/2022   Tobacco abuse 07/09/2022   Lower extremity edema 07/09/2022   Hypothyroidism 07/09/2022   Hyponatremia 07/09/2022   Dizziness 11/04/2012   Mild dehydration 11/04/2012   Bilateral arm weakness 10/21/2012   Alcohol dependence ([place]) 10/21/2012    Orientation RESPIRATION BLADDER Height & Weight     Self, Time, Situation, Place  Normal Continent Weight: 172 lb 9.9 oz (78.3 kg) Height:  5\' 8"  (172.7 cm)  BEHAVIORAL SYMPTOMS/MOOD NEUROLOGICAL BOWEL NUTRITION STATUS      Continent Diet (carb modified)  AMBULATORY STATUS COMMUNICATION OF NEEDS Skin   Limited Assist Verbally Normal                       Personal Care Assistance Level of Assistance  Bathing, Feeding, Dressing Bathing Assistance: Limited assistance Feeding assistance: Limited assistance Dressing Assistance: Limited assistance     Functional Limitations Info             [place]  PT (By licensed PT), OT (By licensed OT)     PT Frequency: 5x/wk OT Frequency: 5x/wk            Contractures Contractures Info: Not present     Additional Factors Info  Code Status, Allergies Code Status Info: Full Allergies Info: Chocolate, Influenza Vaccines, Prilosec (Omeprazole), Wellbutrin (Bupropion)           Current Medications (07/12/2022):  This is the current hospital active medication list Current Facility-Administered Medications  Medication Dose Route Frequency Provider Last Rate Last Admin   acetaminophen (TYLENOL) tablet 650 mg  650 mg Oral Q4H PRN Marylyn Ishihara, Tyrone A, DO       albuterol (PROVENTIL) (2.5 MG/3ML) 0.083% nebulizer solution 3 mL  3 mL Inhalation Q6H PRN Marylyn Ishihara, Tyrone A, DO       aspirin EC tablet 81 mg  81 mg Oral Daily Bonnielee Haff, MD   81 mg at 07/12/22 0819   atorvastatin (LIPITOR) tablet 40 mg  40 mg Oral Daily Rosalin Hawking, MD   40 mg at 07/12/22 0819   clopidogrel (PLAVIX) tablet 75 mg  75 mg Oral Daily Bhagat, Srishti L, MD   75 mg at 07/12/22 0819   enoxaparin (LOVENOX) injection 40 mg  40 mg Subcutaneous Q24H Bonnielee Haff, MD   40 mg at 10/05/30 3557   folic acid (FOLVITE) tablet 1 mg  1 mg Oral Daily Kyle, Tyrone A, DO   1 mg at 07/12/22 0819   levothyroxine (SYNTHROID) tablet 125 mcg  125 mcg Oral Q0600 Marylyn Ishihara, Tyrone A, DO   125 mcg at 07/12/22 0600   LORazepam (ATIVAN) tablet  1-4 mg  1-4 mg Oral Q1H PRN Ronaldo Miyamoto, Tyrone A, DO       Or   LORazepam (ATIVAN) injection 1-4 mg  1-4 mg Intravenous Q1H PRN Ronaldo Miyamoto, Tyrone A, DO       multivitamin with minerals tablet 1 tablet  1 tablet Oral Daily Kyle, Tyrone A, DO   1 tablet at 07/12/22 9604   nicotine (NICODERM CQ - dosed in mg/24 hours) patch 21 mg  21 mg Transdermal Daily Kyle, Tyrone A, DO   21 mg at 07/12/22 0820   senna-docusate (Senokot-S) tablet 1 tablet  1 tablet Oral QHS PRN Ronaldo Miyamoto, Tyrone A, DO       sodium chloride tablet 1 g  1 g Oral BID WC Osvaldo Shipper, MD       thiamine (VITAMIN B1) tablet 100 mg  100 mg Oral Daily Kyle, Tyrone A, DO   100 mg at 07/12/22 5409   Or   thiamine (VITAMIN B1) injection 100 mg  100 mg Intravenous Daily  Margie Ege A, DO         Discharge Medications: Please see discharge summary for a list of discharge medications.  Relevant Imaging Results:  Relevant Lab Results:   Additional Information SS#: [id number]  Baldemar Lenis, LCSW

## 2022-07-12 NOTE — Progress Notes (Addendum)
Inpatient Rehab Coordinator Note:  I met with patient at bedside to discuss CIR recommendations and goals/expectations of CIR stay.  We reviewed 3 hrs/day of therapy, physician follow up, and average length of stay 2 weeks (dependent upon progress) with goals of mod I. Patient is agreeable to pursuing CIR. Called brother to verify disposition and that patient would have some supervision at home. Waiting to hear back from brother.   Addendum: spoke with brother Vincent [name]. He is unable to provide 24 hour supervision. Reports there is no one else who could provide either. Patient would have intermittent assistance only. May need to look into other discharge settings for patient safety.   Rehab Admissons Coordinator Vincent [name], [place], [place] [phone]

## 2022-07-13 ENCOUNTER — Inpatient Hospital Stay (HOSPITAL_COMMUNITY)
Admission: RE | Admit: 2022-07-13 | Discharge: 2022-07-23 | DRG: 057 | Disposition: A | Discharge status: Home / Self Care | Payer: No Typology Code available for payment source | Source: Intra-hospital | Attending: Physical Medicine and Rehabilitation | Admitting: Physical Medicine and Rehabilitation

## 2022-07-13 DIAGNOSIS — E119 Type 2 diabetes mellitus without complications: Secondary | ICD-10-CM | POA: Diagnosis present

## 2022-07-13 DIAGNOSIS — Z7984 Long term (current) use of oral hypoglycemic drugs: Secondary | ICD-10-CM | POA: Diagnosis not present

## 2022-07-13 DIAGNOSIS — R600 Localized edema: Secondary | ICD-10-CM | POA: Diagnosis not present

## 2022-07-13 DIAGNOSIS — Z79890 Hormone replacement therapy (postmenopausal): Secondary | ICD-10-CM | POA: Diagnosis not present

## 2022-07-13 DIAGNOSIS — M7989 Other specified soft tissue disorders: Secondary | ICD-10-CM | POA: Diagnosis present

## 2022-07-13 DIAGNOSIS — E785 Hyperlipidemia, unspecified: Secondary | ICD-10-CM | POA: Diagnosis present

## 2022-07-13 DIAGNOSIS — F17210 Nicotine dependence, cigarettes, uncomplicated: Secondary | ICD-10-CM | POA: Diagnosis present

## 2022-07-13 DIAGNOSIS — E871 Hypo-osmolality and hyponatremia: Secondary | ICD-10-CM

## 2022-07-13 DIAGNOSIS — I10 Essential (primary) hypertension: Secondary | ICD-10-CM | POA: Diagnosis present

## 2022-07-13 DIAGNOSIS — E222 Syndrome of inappropriate secretion of antidiuretic hormone: Secondary | ICD-10-CM | POA: Diagnosis present

## 2022-07-13 DIAGNOSIS — E039 Hypothyroidism, unspecified: Secondary | ICD-10-CM | POA: Diagnosis present

## 2022-07-13 DIAGNOSIS — I639 Cerebral infarction, unspecified: Secondary | ICD-10-CM | POA: Diagnosis not present

## 2022-07-13 DIAGNOSIS — I69354 Hemiplegia and hemiparesis following cerebral infarction affecting left non-dominant side: Secondary | ICD-10-CM | POA: Diagnosis present

## 2022-07-13 DIAGNOSIS — I6381 Other cerebral infarction due to occlusion or stenosis of small artery: Secondary | ICD-10-CM | POA: Diagnosis not present

## 2022-07-13 DIAGNOSIS — Z981 Arthrodesis status: Secondary | ICD-10-CM | POA: Diagnosis not present

## 2022-07-13 DIAGNOSIS — R918 Other nonspecific abnormal finding of lung field: Secondary | ICD-10-CM | POA: Diagnosis present

## 2022-07-13 DIAGNOSIS — Z888 Allergy status to other drugs, medicaments and biological substances status: Secondary | ICD-10-CM

## 2022-07-13 DIAGNOSIS — J45909 Unspecified asthma, uncomplicated: Secondary | ICD-10-CM | POA: Diagnosis present

## 2022-07-13 DIAGNOSIS — Z887 Allergy status to serum and vaccine status: Secondary | ICD-10-CM

## 2022-07-13 DIAGNOSIS — F32A Depression, unspecified: Secondary | ICD-10-CM | POA: Diagnosis present

## 2022-07-13 DIAGNOSIS — Z79899 Other long term (current) drug therapy: Secondary | ICD-10-CM | POA: Diagnosis not present

## 2022-07-13 DIAGNOSIS — E1169 Type 2 diabetes mellitus with other specified complication: Secondary | ICD-10-CM

## 2022-07-13 DIAGNOSIS — E669 Obesity, unspecified: Secondary | ICD-10-CM

## 2022-07-13 DIAGNOSIS — Z7982 Long term (current) use of aspirin: Secondary | ICD-10-CM | POA: Diagnosis not present

## 2022-07-13 LAB — GLUCOSE, CAPILLARY: Glucose-Capillary: 140 mg/dL — ABNORMAL HIGH (ref 70–99)

## 2022-07-13 MED ORDER — ACETAMINOPHEN 325 MG PO TABS
650.0000 mg | ORAL_TABLET | ORAL | Status: DC | PRN
  Administered 2022-07-14: 650 mg via ORAL
  Filled 2022-07-13: qty 2

## 2022-07-13 MED ORDER — ENOXAPARIN SODIUM 40 MG/0.4ML IJ SOSY: 40.0000 mg | PREFILLED_SYRINGE | INTRAMUSCULAR | Status: DC

## 2022-07-13 MED ORDER — CLOPIDOGREL BISULFATE 75 MG PO TABS
75.0000 mg | ORAL_TABLET | Freq: Every day | ORAL | Status: DC
  Administered 2022-07-14 – 2022-07-23 (×10): 75 mg via ORAL
  Filled 2022-07-13 (×10): qty 1

## 2022-07-13 MED ORDER — LEVOTHYROXINE SODIUM 25 MCG PO TABS
125.0000 ug | ORAL_TABLET | Freq: Every day | ORAL | Status: DC
  Administered 2022-07-14 – 2022-07-23 (×10): 125 ug via ORAL
  Filled 2022-07-13 (×10): qty 1

## 2022-07-13 MED ORDER — ENOXAPARIN SODIUM 40 MG/0.4ML IJ SOSY
40.0000 mg | PREFILLED_SYRINGE | INTRAMUSCULAR | Status: DC
  Administered 2022-07-13 – 2022-07-14 (×2): 40 mg via SUBCUTANEOUS
  Filled 2022-07-13 (×2): qty 0.4

## 2022-07-13 MED ORDER — ALBUTEROL SULFATE (2.5 MG/3ML) 0.083% IN NEBU: 3.0000 mL | INHALATION_SOLUTION | Freq: Four times a day (QID) | RESPIRATORY_TRACT | Status: DC | PRN

## 2022-07-13 MED ORDER — SODIUM CHLORIDE 1 G PO TABS
1.0000 g | ORAL_TABLET | Freq: Two times a day (BID) | ORAL | Status: DC
  Administered 2022-07-13 – 2022-07-23 (×5): 1 g via ORAL
  Filled 2022-07-13 (×20): qty 1

## 2022-07-13 MED ORDER — ADULT MULTIVITAMIN W/MINERALS CH
1.0000 | ORAL_TABLET | Freq: Every day | ORAL | Status: DC
  Administered 2022-07-14 – 2022-07-23 (×10): 1 via ORAL
  Filled 2022-07-13 (×10): qty 1

## 2022-07-13 MED ORDER — METFORMIN HCL 500 MG PO TABS
500.0000 mg | ORAL_TABLET | Freq: Two times a day (BID) | ORAL | Status: DC
  Administered 2022-07-13 – 2022-07-23 (×20): 500 mg via ORAL
  Filled 2022-07-13 (×20): qty 1

## 2022-07-13 MED ORDER — THIAMINE MONONITRATE 100 MG PO TABS
100.0000 mg | ORAL_TABLET | Freq: Every day | ORAL | Status: DC
  Administered 2022-07-14 – 2022-07-23 (×10): 100 mg via ORAL
  Filled 2022-07-13 (×10): qty 1

## 2022-07-13 MED ORDER — SENNOSIDES-DOCUSATE SODIUM 8.6-50 MG PO TABS: 1.0000 | ORAL_TABLET | Freq: Every evening | ORAL | Status: DC | PRN

## 2022-07-13 MED ORDER — FOLIC ACID 1 MG PO TABS
1.0000 mg | ORAL_TABLET | Freq: Every day | ORAL | Status: DC
  Administered 2022-07-14 – 2022-07-23 (×10): 1 mg via ORAL
  Filled 2022-07-13 (×10): qty 1

## 2022-07-13 MED ORDER — THIAMINE HCL 100 MG/ML IJ SOLN
100.0000 mg | Freq: Every day | INTRAMUSCULAR | Status: DC
  Filled 2022-07-13 (×3): qty 2

## 2022-07-13 MED ORDER — ATORVASTATIN CALCIUM 40 MG PO TABS
40.0000 mg | ORAL_TABLET | Freq: Every day | ORAL | Status: DC
  Administered 2022-07-14 – 2022-07-23 (×10): 40 mg via ORAL
  Filled 2022-07-13 (×10): qty 1

## 2022-07-13 MED ORDER — ASPIRIN 81 MG PO TBEC
81.0000 mg | DELAYED_RELEASE_TABLET | Freq: Every day | ORAL | Status: DC
  Administered 2022-07-14 – 2022-07-23 (×10): 81 mg via ORAL
  Filled 2022-07-13 (×10): qty 1

## 2022-07-13 MED ORDER — NICOTINE 21 MG/24HR TD PT24
21.0000 mg | MEDICATED_PATCH | Freq: Every day | TRANSDERMAL | Status: DC
  Filled 2022-07-13 (×7): qty 1

## 2022-07-13 NOTE — Progress Notes (Signed)
Physical Therapy Treatment Patient Details Name: Vincent [name] MRN: [id number] DOB: [birth year] Today's Date: 07/13/2022   History of Present Illness 76 y.o. male Presenting 07/09/22 with left leg weakness. 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 NIHSS=4; CIWA protocol; doppler LLE negative for DVT. PMH significant of HTN, DM2, shoulder arthroscopy    PT Comments    Pt progressing well towards all goals. PT session focused on controlling/minimizing L knee hyperextension in standing and with ambulation. Pt continues with impaired balance, L hemiplegia, decreased insight to safety and deficits as well as requiring use of RW for safe ambulation. Acute PT to continue to follow.    Recommendations for follow up therapy are one component of a multi-disciplinary discharge planning process, led by the attending physician.  Recommendations may be updated based on patient status, additional functional criteria and insurance authorization.  Follow Up Recommendations  Acute inpatient rehab (3hours/day)     Assistance Recommended at Discharge Frequent or constant Supervision/Assistance  Patient can return home with the following A little help with walking and/or transfers;A little help with bathing/dressing/bathroom;Assist for transportation;Help with stairs or ramp for entrance;Assistance with cooking/housework   Equipment Recommendations  Rolling walker (2 wheels)    Recommendations for Other Services OT consult;Rehab consult;Speech consult     Precautions / Restrictions Precautions Precautions: Fall Precaution Comments: impulsive Restrictions Weight Bearing Restrictions: No     Mobility  Bed Mobility               General bed mobility comments: pt. found in chair finishing lunch upon arrival.    Transfers       Sit to Stand: Min assist           General transfer comment: verbal cues for safe hand placement when pushing up to walker and  reaching back for chair, minA for optimal L LE position/kinematics to prevent hyperextension at knee and bracing with back of leg on chair    Ambulation/Gait Ambulation/Gait assistance: Mod assist             General Gait Details: pt LLE hyperextends during gait but able to correct with tactile cues at posterior knee preventing hyperextension. Pt with progressive increased flexion and increased dependence on bilat UEs with onset of fatigue. verbal cues to increased L LE step height to clear foot consistently   Stairs             Wheelchair Mobility    Modified Rankin (Stroke Patients Only) Modified Rankin (Stroke Patients Only) Pre-Morbid Rankin Score: No symptoms Modified Rankin: Moderately severe disability     Balance           Standing balance support: Reliant on assistive device for balance, During functional activity   Standing balance comment: dependent on external support                            Cognition   Behavior During Therapy: Impulsive Overall Cognitive Status: Impaired/Different from baseline Area of Impairment: Awareness, Safety/judgement                         Safety/Judgement: Decreased awareness of safety, Decreased awareness of deficits Awareness: Emergent   General Comments: pt alert and oriented; stated he would be transfered upstairs to AIR today however remains impulsive, appears more aware of deficits today compared to yesterday however remains to still have overall decreased insight  Exercises Other Exercises Other Exercises: Repeated sit to stand x5 with L posterior knee block to avoid hyperextension. Initially cued the pt to press up from the chair before grabbing the walker. Max cues to weight shift L and straighten knee during stance    General Comments General comments (skin integrity, edema, etc.): VSS      Pertinent Vitals/Pain      Home Living                          Prior  Function            PT Goals (current goals can now be found in the care plan section) Progress towards PT goals: Progressing toward goals    Frequency    Min 4X/week      PT Plan Current plan remains appropriate    Co-evaluation              AM-PAC PT "6 Clicks" Mobility   Outcome Measure  Help needed turning from your back to your side while in a flat bed without using bedrails?: A Little Help needed moving from lying on your back to sitting on the side of a flat bed without using bedrails?: A Little Help needed moving to and from a bed to a chair (including a wheelchair)?: A Little Help needed standing up from a chair using your arms (e.g., wheelchair or bedside chair)?: A Lot Help needed to walk in hospital room?: A Lot Help needed climbing 3-5 steps with a railing? : Total 6 Click Score: 14    End of Session Equipment Utilized During Treatment: Gait belt Activity Tolerance: Patient tolerated treatment well (started to fatigue at the end during ambulation although pt stated he was not tired at end of session) Patient left: in chair;with call bell/phone within reach;with chair alarm set   PT Visit Diagnosis: Hemiplegia and hemiparesis Hemiplegia - Right/Left: Left Hemiplegia - dominant/non-dominant: Non-dominant Hemiplegia - caused by: Cerebral infarction     Time: 1211-1235 PT Time Calculation (min) (ACUTE ONLY): 24 min  Charges:  $Gait Training: 8-22 mins $Neuromuscular Re-education: 8-22 mins                     Kittie Plater, PT, DPT Acute Rehabilitation Services Secure chat preferred Office #: [phone]    Berline Lopes 07/13/2022, 1:55 PM
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
## 2022-07-13 NOTE — H&P (Signed)
Physical Medicine and Rehabilitation Admission H&P        Chief Complaint  Patient presents with   Weakness  : HPI: Vincent [name] is a 76 year old right-handed male with history of depression as well as 2012, diabetes mellitus, stable left upper lobe pulmonary nodules, papillary cancer, hypertension, asthma with tobacco/alcohol use.  Per chart review patient lives alone.  Independent prior to admission.  1 level apartment.  Presented 07/09/2022 with acute onset of left-sided weakness.  Cranial CT scan negative.  CT no emergent large vessel occlusion.  2 left upper lobe pulmonary nodules measuring 6 mm.  Recommendations noncontrast CT 3 to 6 months.  Patient did not receive tPA.  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  Admission chemistries unremarkable except sodium 130 glucose 203, alcohol negative, urine drug screen negative, WBC 12,700, hemoglobin A1c 6.8.  Echo with ejection fraction of 60 to 65% no wall motion abnormalities. Maintained on low-dose aspirin as well as Plavix for CVA prophylaxis for 3 weeks then Plavix alone.  Subcutaneous Lovenox for DVT prophylaxis.  Venous Doppler studies negative.  Therapy evaluations completed due to patient decreased functional mobility left-sided weakness was admitted for comprehensive rehab program.     Please asked   Review of Systems  Constitutional:  Negative for chills and fever.  HENT:  Negative for hearing loss.   Eyes:  Negative for blurred vision and double vision.  Respiratory:  Negative for cough, shortness of breath and wheezing.   Cardiovascular:  Negative for chest pain and palpitations.  Gastrointestinal:  Positive for constipation. Negative for heartburn, nausea and vomiting.  Genitourinary:  Negative for dysuria, flank pain and hematuria.  Musculoskeletal:  Positive for joint pain and myalgias.  Skin:  Negative for rash.  Neurological:  Positive for weakness.  Psychiatric/Behavioral:   Positive for depression. The patient has insomnia.   All other systems reviewed and are negative.       Past Medical History:  Diagnosis Date   Asthma     Depression     Diabetes mellitus     Hypertension     Numbness     Stroke [facility](HCC)     Suicide and self-inflicted injury (HCC) Aug, 2012    cut wrist         Past Surgical History:  Procedure Laterality Date   APPENDECTOMY       SHOULDER ARTHROSCOPY       TONSILLECTOMY        History reviewed. No pertinent family history. Social History:  reports that he has been smoking cigarettes. He has a 50.00 pack-year smoking history. He has never used smokeless tobacco. He reports current alcohol use of about 8.0 standard drinks of alcohol per week. He reports that he does not use drugs. Allergies:       Allergies  Allergen Reactions   Chocolate Swelling and Other (See Comments)      Hypotension [facility] reported   Influenza Vaccines Hives      [facility] reports, allergy to all influenza vaccines   Prilosec [Omeprazole] Diarrhea      [facility] reported   Wellbutrin [Bupropion] Hypertension      [facility] reported          Medications Prior to Admission  Medication Sig Dispense Refill   albuterol (PROVENTIL HFA;VENTOLIN HFA) 108 (90 BASE) MCG/ACT inhaler Inhale 2 puffs into the lungs every 6 (six) hours as needed for shortness of breath. For: Wheezing/shortness of breath. (  Patient taking differently: Inhale 1 puff into the lungs as needed for shortness of breath or wheezing. For: Wheezing/shortness of breath.)       amLODipine (NORVASC) 10 MG tablet Take 10 mg by mouth daily.       aspirin EC 81 MG tablet Take 81 mg by mouth daily.       levothyroxine (SYNTHROID) 125 MCG tablet Take 125 mcg by mouth daily.       lisinopril-hydrochlorothiazide (PRINZIDE,ZESTORETIC) 20-12.5 MG per tablet Take 1 tablet by mouth daily. For high blood pressure control (Patient taking differently: Take 2 tablets by mouth daily.)       metFORMIN (GLUCOPHAGE) 500 MG tablet Take 500  mg by mouth 2 (two) times daily.       Multiple Vitamins-Minerals (MULTIVITAMIN ADULTS 50+) TABS Take 1 tablet by mouth daily.       Sodium Chloride (NASAL MIST IN) Place 1 spray into the nose as needed (running nose).              Home: Home Living Family/patient expects to be discharged to:: Private residence Living Arrangements: Alone Available Help at Discharge: Family, Available PRN/intermittently Type of Home: Apartment Home Access: Level entry Home Layout: One level Bathroom Shower/Tub: Health visitor: Standard Home Equipment: None  Lives With: Alone   Functional History: Prior Function Prior Level of Function : Independent/Modified Independent, Driving   Functional Status:  Mobility: Bed Mobility Overal bed mobility: Needs Assistance Bed Mobility: Sit to Supine Supine to sit: Min guard Sit to supine: Min guard General bed mobility comments: up in chair upon OT entry Transfers Overall transfer level: Needs assistance Equipment used: Rolling walker (2 wheels) Transfers: Sit to/from Stand Sit to Stand: Min guard Bed to/from chair/wheelchair/BSC transfer type:: Step pivot Step pivot transfers: Mod assist General transfer comment: min gaurd to rise, up to mod A when completing functional ambulation due to L knee buckling Ambulation/Gait Ambulation/Gait assistance: Min assist, Mod assist Gait Distance (Feet): 45 Feet Assistive device: Rolling walker (2 wheels) Gait Pattern/deviations: Step-to pattern, Decreased step length - right, Decreased stance time - left, Decreased dorsiflexion - left, Knee hyperextension - left General Gait Details: L leg snapping into hyperextension with each step, pt able to correct with cues but unable to maintain without direct cues. cues for RW proximity, reduced DF of LLE with fatigue Gait velocity: decreased Gait velocity interpretation: <1.31 ft/sec, indicative of household ambulator   ADL: ADL Overall ADL's : Needs  assistance/impaired Eating/Feeding: Minimal assistance Eating/Feeding Details (indicate cue type and reason): min A for bil tasks (cutting meat, etc) Grooming: Oral care, Set up, Sitting Grooming Details (indicate cue type and reason): Pt using problem solving to overcome LUE incoordination during oral care. Upper Body Bathing: Set up, Sitting Lower Body Bathing: Minimal assistance, Sit to/from stand Upper Body Dressing : Minimal assistance Lower Body Dressing: Minimal assistance, Sitting/lateral leans Lower Body Dressing Details (indicate cue type and reason): Min A. Pt doffing L sock with significantly increased time, but unable to don due to poor coordination and dexterity as well as inability to perform figure 4 or adequately reach down to feet. Pt donning and doffing R sock with significantly incresaed time. Able to perform modified figure 4 Toilet Transfer: Minimal assistance, Moderate assistance, Stand-pivot, BSC/3in1, Rolling walker (2 wheels) Toilet Transfer Details (indicate cue type and reason): Max multimodal cues to coordinate RW and BLE. Toileting- Clothing Manipulation and Hygiene: Moderate assistance, Sit to/from stand Functional mobility during ADLs: Minimal assistance, Moderate assistance, Rolling  walker (2 wheels) General ADL Comments: Min-mod A for functional mobility. Up to max cues needed with fatigue   Cognition: Cognition Overall Cognitive Status: Within Functional Limits for tasks assessed Arousal/Alertness: Awake/alert Orientation Level: Oriented X4 Year: 2023 Month: October Day of Week: Correct Memory: Appears intact Awareness: Impaired Awareness Impairment: Anticipatory impairment Problem Solving: Appears intact Safety/Judgment: Other (comment) Comments: Patient does not appear patient with staying too long "I'll give them a week" and this seems to result in him perhaps downplaying his deficits even to himself Cognition Arousal/Alertness:  Awake/alert Behavior During Therapy: WFL for tasks assessed/performed Overall Cognitive Status: Within Functional Limits for tasks assessed Area of Impairment: Safety/judgement, Awareness Safety/Judgement: Decreased awareness of deficits Awareness: Emergent General Comments: emerging safety awareness, however requires cues when navigating (turning with walker in the hallway)   Physical Exam: Blood pressure (!) 140/76, pulse 76, temperature 97.8 F (36.6 C), temperature source Oral, resp. rate 17, height 5\' 8"  (1.727 m), weight 78.3 kg, SpO2 97 %. Physical Exam Constitutional:      Appearance: Normal appearance.  HENT:     Head: Normocephalic and atraumatic.     Nose: Nose normal.     Mouth/Throat:     Pharynx: Oropharynx is clear.  Eyes:     General: No scleral icterus.    Extraocular Movements: Extraocular movements intact.     Pupils: Pupils are equal, round, and reactive to light.  Cardiovascular:     Rate and Rhythm: Normal rate and regular rhythm.     Heart sounds: No murmur heard.    No gallop.  Pulmonary:     Effort: Pulmonary effort is normal. No respiratory distress.     Breath sounds: Normal breath sounds. No wheezing.  Abdominal:     General: There is no distension.     Palpations: Abdomen is soft. There is no mass.  Musculoskeletal:        General: Swelling (bilateral pretibial edema 1+) present.     Cervical back: Normal range of motion.  Skin:    Comments: A few scattered bruises and abrasions. Chronic vascular changes in the distal LE's. Old surgical decompression scar right anterior neck.   Neurological:     Mental Status: He is alert.     Comments: Alert and oriented x 3. Normal insight and awareness. Intact Memory. Normal language. No dysarthria but he hypophonates/voice is hoarse. CN exam unremarkable.  LUE 4-/5 deltoid 4+ biceps, triceps, 4 to 4+ with wrist flexion and grip. LLE 4/5 prox to distal. RUE and RLE 5/5 prox to distal. ?mild proprioceptive  deficits in left hand (?chronic) otherwise no abnl in pain r light touch appreciated. No resting tone or cerebellar signs. FMC decreased on left.     Psychiatric:        Mood and Affect: Mood normal.        Behavior: Behavior normal.        Lab Results Last 48 Hours        Results for orders placed or performed during the hospital encounter of 07/09/22 (from the past 48 hour(s))  TSH     Status: None    Collection Time: 07/12/22  4:35 AM  Result Value Ref Range    TSH 4.224 0.350 - 4.500 uIU/mL      Comment: Performed by a 3rd Generation assay with a functional sensitivity of <=0.01 uIU/mL. Performed at [facility], [address], [place], [place]    T4, free     Status:  None    Collection Time: 07/12/22  4:35 AM  Result Value Ref Range    Free T4 0.97 0.61 - 1.12 ng/dL      Comment: (NOTE) Biotin ingestion may interfere with free T4 tests. If the results are inconsistent with the TSH level, previous test results, or the clinical presentation, then consider biotin interference. If needed, order repeat testing after stopping biotin. Performed at [facility], [address] [place], [place] [address]    Cortisol     Status: None    Collection Time: 07/12/22  4:35 AM  Result Value Ref Range    Cortisol, Plasma 15.5 ug/dL      Comment: (NOTE) AM    6.7 - 22.6 ug/dL PM   <41.9       ug/dL Performed at [facility], [address], [place], [place] [address]    Basic metabolic panel     Status: Abnormal    Collection Time: 07/12/22  4:35 AM  Result Value Ref Range    Sodium 134 (L) 135 - 145 mmol/L    Potassium 3.9 3.5 - 5.1 mmol/L    Chloride 97 (L) 98 - 111 mmol/L    CO2 25 22 - 32 mmol/L    Glucose, Bld 164 (H) 70 - 99 mg/dL      Comment: Glucose reference range applies only to samples taken after fasting for at least 8 hours.    BUN 18 8 - 23 mg/dL    Creatinine, Ser 4.09 0.61 - 1.24 mg/dL    Calcium 8.8 (L) 8.9 - 10.3 mg/dL    GFR,  Estimated >73 >60 mL/min      Comment: (NOTE) Calculated using the CKD-EPI Creatinine Equation (2021)      Anion gap 12 5 - 15      Comment: Performed at [facility], [address], [place], [place] [address]  Magnesium     Status: None    Collection Time: 07/12/22  4:35 AM  Result Value Ref Range    Magnesium 1.8 1.7 - 2.4 mg/dL      Comment: Performed at [facility], [address] [place], [place] [address]      Imaging Results (Last 48 hours)  No results found.         Blood pressure (!) 140/76, pulse 76, temperature 97.8 F (36.6 C), temperature source Oral, resp. rate 17, height 5\' 8"  (1.727 m), weight 78.3 kg, SpO2 97 %.   Medical Problem List and Plan: 1. 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 Pt with new left sided weakness although he had some prior LUE weakness from a cervical spondylosis/myelopathy (hx of ACDF)             -patient may shower             -ELOS/Goals: 7-10 days, mod I goals with PT and OT. Pt has some nearby family and friends who can check in on him. 1 level home with level entry. 2.  Antithrombotics: -DVT/anticoagulation:  Pharmaceutical: Lovenox             -antiplatelet therapy: Aspirin and Plavix x3 weeks then Plavix alone 3. Pain Management: Tylenol as needed 4. Mood/Behavior/Sleep: Provide emotional support             -antipsychotic agents: N/A 5. Neuropsych/cognition: This patient is capable of making decisions on his own behalf. 6. Skin/Wound Care: Routine skin check  7. Fluids/Electrolytes/Nutrition: Routine in and outs with follow-up chemistries 8.  Permissive hypertension.  Patient on Norvasc 10 mg daily as well as Zestoretic 20-12.5 mg daily prior to admission.  Resume as needed             -bp presently controlled 9.  Tobacco/alcohol use.  NicoDerm patch.  Provide counseling 10.  Hypothyroidism.  Synthroid 11.  Diabetes mellitus.  Hemoglobin A1c 6.8.  Resume  Glucophage 500 mg twice daily             -monitor sugars AC and HS 12.  Hyperlipidemia.  Lipitor 13.  Hyponatremia.  Continue sodium chloride tabs for now.  Follow-up chemistries upon admission 14.  History of pulmonary nodules.  Follow-up CT 3 to 6 months.   Lavon Paganini Angiulli, PA-C 07/13/2022   I have personally performed a face to face diagnostic evaluation of this patient and formulated the key components of the plan.  Additionally, I have personally reviewed laboratory data, imaging studies, as well as relevant notes and concur with the physician assistant's documentation above.  The patient's status has not changed from the original H&P.  Any changes in documentation from the acute care chart have been noted above.  Vincent Staggers, MD, Mellody Drown

## 2022-07-13 NOTE — TOC Transition Note (Signed)
Transition of Care [facility]) - CM/SW Discharge Note   Patient Details  Name: Vincent [name] MRN: [id number] Date of Birth: Apr 01, 1946  Transition of Care [facility] CM/SW Contact:  Pollie Friar, RN Phone Number: 07/13/2022, 9:48 AM   Clinical Narrative:    Pt is discharging to CIR today. CM signing off.    Final next level of care: IP Rehab Facility Barriers to Discharge: No Barriers Identified   Patient Goals and CMS Choice   CMS Medicare.gov Compare Post Acute Care list provided to:: Patient Choice offered to / list presented to : Patient  Discharge Placement                       Discharge Plan and Services                                     Social Determinants of Health (SDOH) Interventions     Readmission Risk Interventions     No data to display

## 2022-07-13 NOTE — Progress Notes (Signed)
Inpatient Rehabilitation Admission Medication Review by a Pharmacist  A complete drug regimen review was completed for this patient to identify any potential clinically significant medication issues.  High Risk Drug Classes Is patient taking? Indication by Medication  Antipsychotic No   Anticoagulant Yes DVT px  Antibiotic No   Opioid No   Antiplatelet Yes ASA/plavix x3 wks then plavix alone- CVA  Hypoglycemics/insulin Yes Metformin - DM  Vasoactive Medication No   Chemotherapy No   Other Yes Atorvastatin - HLD Nicotine patch - smoking Synthroid - hypothyrodism Folic/thiamine - ETOH     Type of Medication Issue Identified Description of Issue Recommendation(s)  Drug Interaction(s) (clinically significant)     Duplicate Therapy     Allergy     No Medication Administration End Date     Incorrect Dose     Additional Drug Therapy Needed     Significant med changes from prior encounter (inform family/care partners about these prior to discharge).    Other       Clinically significant medication issues were identified that warrant physician communication and completion of prescribed/recommended actions by midnight of the next day:  No  Name of provider notified for urgent issues identified:   Provider Method of Notification:     Pharmacist comments:   Time spent performing this drug regimen review (minutes):  [place], PharmD, [place], AAHIVP, CPP Infectious Disease Pharmacist 07/13/2022 3:22 PM

## 2022-07-13 NOTE — Discharge Instructions (Addendum)
Inpatient Rehab Discharge Instructions  Earon Rivest Discharge date and time: No discharge date for patient encounter.   Activities/Precautions/ Functional Status: Activity: activity as tolerated Diet: Diabetic diet Wound Care: Routine skin checks Functional status:  ___ No restrictions     ___ Walk up steps independently ___ 24/7 supervision/assistance   ___ Walk up steps with assistance ___ Intermittent supervision/assistance  ___ Bathe/dress independently ___ Walk with walker     _x__ Bathe/dress with assistance ___ Walk Independently    ___ Shower independently ___ Walk with assistance    ___ Shower with assistance ___ No alcohol     ___ Return to work/school ________   COMMUNITY REFERRALS UPON DISCHARGE:    Outpatient: PT     OT                 Agency: *Please expect [facility] from [facility] to [facility] an [facility] OR a referral will be sent to community care department if you are determined as eligible for community therapies. If approved for community care therapies, be sure to share with the [facility] your preferred location is [facility] location [phone]. IF not approved, therapies will be conducted at the [facility] rehab in [place].              Medical Equipment/Items Ordered:rolling walker and shower seat with back                                                 Agency[facility]Supplier: [facility] (brother's address)  GENERAL COMMUNITY RESOURCES FOR PATIENT/FAMILY: Reminder that is transportation services are needed, be sure to contact your local [facility] [phone] and discuss with you assigned social worker or PCP for assistance.    Special Instructions: No driving smoking or alcohol  Continue aspirin 81 mg daily and Plavix 75 mg daily through 07/28/2022 then Plavix alone  Follow-up with PCP and monitoring of sodium levels  Bactroban to left second toe daily cover with foam dressing change dressing every 3 days as  needed        STROKE/TIA DISCHARGE INSTRUCTIONS SMOKING Cigarette smoking nearly doubles your risk of having a stroke & is the single most alterable risk factor  If you smoke or have smoked in the last 12 months, you are advised to quit smoking for your health. Most of the excess cardiovascular risk related to smoking disappears within a year of stopping. Ask you doctor about anti-smoking medications [place] Quit Line: 1-800-QUIT NOW Free Smoking Cessation Classes [phone]  CHOLESTEROL Know your levels; limit fat & cholesterol in your diet  Lipid Panel     Component Value Date/Time   CHOL 170 07/10/2022 0510   TRIG 104 07/10/2022 0510   HDL 40 (L) 07/10/2022 0510   CHOLHDL 4.3 07/10/2022 0510   VLDL 21 07/10/2022 0510   LDLCALC 109 (H) 07/10/2022 0510     Many patients benefit from treatment even if their cholesterol is at goal. Goal: Total Cholesterol (CHOL) less than 160 Goal:  Triglycerides (TRIG) less than 150 Goal:  HDL greater than 40 Goal:  LDL (LDLCALC) less than 100   BLOOD PRESSURE American Stroke Association blood pressure target is less that 120/80 mm/Hg  Your discharge blood pressure is:    Monitor your blood pressure Limit your salt and alcohol intake Many individuals will require more  than one medication for high blood pressure  DIABETES (A1c is a blood sugar average for last 3 months) Goal HGBA1c is under 7% (HBGA1c is blood sugar average for last 3 months)  Diabetes:   Lab Results  Component Value Date   HGBA1C 6.8 (H) 07/10/2022    Your HGBA1c can be lowered with medications, healthy diet, and exercise. Check your blood sugar as directed by your physician Call your physician if you experience unexplained or low blood sugars.  PHYSICAL ACTIVITY/REHABILITATION Goal is 30 minutes at least 4 days per week  Activity: Increase activity slowly, Therapies: Physical Therapy: Home Health Return to work:  Activity decreases your risk of heart attack and stroke  and makes your heart stronger.  It helps control your weight and blood pressure; helps you relax and can improve your mood. Participate in a regular exercise program. Talk with your doctor about the best form of exercise for you (dancing, walking, swimming, cycling).  DIET/WEIGHT Goal is to maintain a healthy weight  Your discharge diet is:  Diet Order     None       liquids Your height is:    Your current weight is:   Your Body Mass Index (BMI) is:    Following the type of diet specifically designed for you will help prevent another stroke. Your goal weight range is:   Your goal Body Mass Index (BMI) is 19-24. Healthy food habits can help reduce 3 risk factors for stroke:  High cholesterol, hypertension, and excess weight.  RESOURCES Stroke/Support Group:  Call [phone]   STROKE EDUCATION PROVIDED/REVIEWED AND GIVEN TO PATIENT Stroke warning signs and symptoms How to activate emergency medical system (call 911). Medications prescribed at discharge. Need for follow-up after discharge. Personal risk factors for stroke. Pneumonia vaccine given: No Flu vaccine given: No My questions have been answered, the writing is legible, and I understand these instructions.  I will adhere to these goals & educational materials that have been provided to me after my discharge from the hospital.      STROKE/TIA DISCHARGE INSTRUCTIONS SMOKING Cigarette smoking nearly doubles your risk of having a stroke & is the single most alterable risk factor  If you smoke or have smoked in the last 12 months, you are advised to quit smoking for your health. Most of the excess cardiovascular risk related to smoking disappears within a year of stopping. Ask you doctor about anti-smoking medications [place] Quit Line: 1-800-QUIT NOW Free Smoking Cessation Classes [phone]  CHOLESTEROL Know your levels; limit fat & cholesterol in your diet  Lipid Panel     Component Value Date/Time   CHOL 170 07/10/2022  0510   TRIG 104 07/10/2022 0510   HDL 40 (L) 07/10/2022 0510   CHOLHDL 4.3 07/10/2022 0510   VLDL 21 07/10/2022 0510   LDLCALC 109 (H) 07/10/2022 0510     Many patients benefit from treatment even if their cholesterol is at goal. Goal: Total Cholesterol (CHOL) less than 160 Goal:  Triglycerides (TRIG) less than 150 Goal:  HDL greater than 40 Goal:  LDL (LDLCALC) less than 100   BLOOD PRESSURE American Stroke Association blood pressure target is less that 120/80 mm/Hg  Your discharge blood pressure is:    Monitor your blood pressure Limit your salt and alcohol intake Many individuals will require more than one medication for high blood pressure  DIABETES (A1c is a blood sugar average for last 3 months) Goal HGBA1c is under 7% (HBGA1c is blood sugar average  for last 3 months)  Diabetes:   Lab Results  Component Value Date   HGBA1C 6.8 (H) 07/10/2022    Your HGBA1c can be lowered with medications, healthy diet, and exercise. Check your blood sugar as directed by your physician Call your physician if you experience unexplained or low blood sugars.  PHYSICAL ACTIVITY/REHABILITATION Goal is 30 minutes at least 4 days per week  Activity: Increase activity slowly, Therapies: Physical Therapy: Home Health Return to work:  Activity decreases your risk of heart attack and stroke and makes your heart stronger.  It helps control your weight and blood pressure; helps you relax and can improve your mood. Participate in a regular exercise program. Talk with your doctor about the best form of exercise for you (dancing, walking, swimming, cycling).  DIET/WEIGHT Goal is to maintain a healthy weight  Your discharge diet is:  Diet Order     None       liquids Your height is:    Your current weight is:   Your Body Mass Index (BMI) is:    Following the type of diet specifically designed for you will help prevent another stroke. Your goal weight range is:   Your goal Body Mass Index (BMI) is  19-24. Healthy food habits can help reduce 3 risk factors for stroke:  High cholesterol, hypertension, and excess weight.  RESOURCES Stroke/Support Group:  Call [phone]   STROKE EDUCATION PROVIDED/REVIEWED AND GIVEN TO PATIENT Stroke warning signs and symptoms How to activate emergency medical system (call 911). Medications prescribed at discharge. Need for follow-up after discharge. Personal risk factors for stroke. Pneumonia vaccine given: No Flu vaccine given: No My questions have been answered, the writing is legible, and I understand these instructions.  I will adhere to these goals & educational materials that have been provided to me after my discharge from the hospital.    STROKE/TIA DISCHARGE INSTRUCTIONS SMOKING Cigarette smoking nearly doubles your risk of having a stroke & is the single most alterable risk factor  If you smoke or have smoked in the last 12 months, you are advised to quit smoking for your health. Most of the excess cardiovascular risk related to smoking disappears within a year of stopping. Ask you doctor about anti-smoking medications [place] Quit Line: 1-800-QUIT NOW Free Smoking Cessation Classes [phone]  CHOLESTEROL Know your levels; limit fat & cholesterol in your diet  Lipid Panel     Component Value Date/Time   CHOL 170 07/10/2022 0510   TRIG 104 07/10/2022 0510   HDL 40 (L) 07/10/2022 0510   CHOLHDL 4.3 07/10/2022 0510   VLDL 21 07/10/2022 0510   LDLCALC 109 (H) 07/10/2022 0510     Many patients benefit from treatment even if their cholesterol is at goal. Goal: Total Cholesterol (CHOL) less than 160 Goal:  Triglycerides (TRIG) less than 150 Goal:  HDL greater than 40 Goal:  LDL (LDLCALC) less than 100   BLOOD PRESSURE American Stroke Association blood pressure target is less that 120/80 mm/Hg  Your discharge blood pressure is:    Monitor your blood pressure Limit your salt and alcohol intake Many individuals will require more than one  medication for high blood pressure  DIABETES (A1c is a blood sugar average for last 3 months) Goal HGBA1c is under 7% (HBGA1c is blood sugar average for last 3 months)  Diabetes:   Lab Results  Component Value Date   HGBA1C 6.8 (H) 07/10/2022    Your HGBA1c can be lowered with medications, healthy  diet, and exercise. Check your blood sugar as directed by your physician Call your physician if you experience unexplained or low blood sugars.  PHYSICAL ACTIVITY/REHABILITATION Goal is 30 minutes at least 4 days per week  Activity: Increase activity slowly, Therapies: Physical Therapy: Home Health Return to work:  Activity decreases your risk of heart attack and stroke and makes your heart stronger.  It helps control your weight and blood pressure; helps you relax and can improve your mood. Participate in a regular exercise program. Talk with your doctor about the best form of exercise for you (dancing, walking, swimming, cycling).  DIET/WEIGHT Goal is to maintain a healthy weight  Your discharge diet is:  Diet Order     None       liquids Your height is:    Your current weight is:   Your Body Mass Index (BMI) is:    Following the type of diet specifically designed for you will help prevent another stroke. Your goal weight range is:   Your goal Body Mass Index (BMI) is 19-24. Healthy food habits can help reduce 3 risk factors for stroke:  High cholesterol, hypertension, and excess weight.  RESOURCES Stroke/Support Group:  Call [phone]   STROKE EDUCATION PROVIDED/REVIEWED AND GIVEN TO PATIENT Stroke warning signs and symptoms How to activate emergency medical system (call 911). Medications prescribed at discharge. Need for follow-up after discharge. Personal risk factors for stroke. Pneumonia vaccine given: No Flu vaccine given: No My questions have been answered, the writing is legible, and I understand these instructions.  I will adhere to these goals & educational materials  that have been provided to me after my discharge from the hospital.       My questions have been answered and I understand these instructions. I will adhere to these goals and the provided educational materials after my discharge from the hospital.  Patient/Caregiver Signature _______________________________ Date __________  Clinician Signature _______________________________________ Date __________  Please bring this form and your medication list with you to all your follow-up doctor's appointments.

## 2022-07-13 NOTE — H&P (Signed)
Physical Medicine and Rehabilitation Admission H&P    Chief Complaint  Patient presents with   Weakness  : HPI: Vincent [name] is a 76 year old right-handed male with history of depression as well as 2012, diabetes mellitus, stable left upper lobe pulmonary nodules, papillary cancer, hypertension, asthma with tobacco/alcohol use.  Per chart review patient lives alone.  Independent prior to admission.  1 level apartment.  Presented 07/09/2022 with acute onset of left-sided weakness.  Cranial CT scan negative.  CT no emergent large vessel occlusion.  2 left upper lobe pulmonary nodules measuring 6 mm.  Recommendations noncontrast CT 3 to 6 months.  Patient did not receive tPA.  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  Admission chemistries unremarkable except sodium 130 glucose 203, alcohol negative, urine drug screen negative, WBC 12,700, hemoglobin A1c 6.8.  Echo with ejection fraction of 60 to 65% no wall motion abnormalities. Maintained on low-dose aspirin as well as Plavix for CVA prophylaxis for 3 weeks then Plavix alone.  Subcutaneous Lovenox for DVT prophylaxis.  Venous Doppler studies negative.  Therapy evaluations completed due to patient decreased functional mobility left-sided weakness was admitted for comprehensive rehab program.   Please asked  Review of Systems  Constitutional:  Negative for chills and fever.  HENT:  Negative for hearing loss.   Eyes:  Negative for blurred vision and double vision.  Respiratory:  Negative for cough, shortness of breath and wheezing.   Cardiovascular:  Negative for chest pain and palpitations.  Gastrointestinal:  Positive for constipation. Negative for heartburn, nausea and vomiting.  Genitourinary:  Negative for dysuria, flank pain and hematuria.  Musculoskeletal:  Positive for joint pain and myalgias.  Skin:  Negative for rash.  Neurological:  Positive for weakness.  Psychiatric/Behavioral:  Positive for  depression. The patient has insomnia.   All other systems reviewed and are negative.  Past Medical History:  Diagnosis Date   Asthma    Depression    Diabetes mellitus    Hypertension    Numbness    Stroke [facility])    Suicide and self-inflicted injury (HCC) Aug, 2012   cut wrist   Past Surgical History:  Procedure Laterality Date   APPENDECTOMY     SHOULDER ARTHROSCOPY     TONSILLECTOMY     History reviewed. No pertinent family history. Social History:  reports that he has been smoking cigarettes. He has a 50.00 pack-year smoking history. He has never used smokeless tobacco. He reports current alcohol use of about 8.0 standard drinks of alcohol per week. He reports that he does not use drugs. Allergies:  Allergies  Allergen Reactions   Chocolate Swelling and Other (See Comments)    Hypotension [facility] reported   Influenza Vaccines Hives    [facility] reports, allergy to all influenza vaccines   Prilosec [Omeprazole] Diarrhea    [facility] reported   Wellbutrin [Bupropion] Hypertension    [facility] reported   Medications Prior to Admission  Medication Sig Dispense Refill   albuterol (PROVENTIL HFA;VENTOLIN HFA) 108 (90 BASE) MCG/ACT inhaler Inhale 2 puffs into the lungs every 6 (six) hours as needed for shortness of breath. For: Wheezing/shortness of breath. (Patient taking differently: Inhale 1 puff into the lungs as needed for shortness of breath or wheezing. For: Wheezing/shortness of breath.)     amLODipine (NORVASC) 10 MG tablet Take 10 mg by mouth daily.     aspirin EC 81 MG tablet Take 81 mg by mouth daily.  levothyroxine (SYNTHROID) 125 MCG tablet Take 125 mcg by mouth daily.     lisinopril-hydrochlorothiazide (PRINZIDE,ZESTORETIC) 20-12.5 MG per tablet Take 1 tablet by mouth daily. For high blood pressure control (Patient taking differently: Take 2 tablets by mouth daily.)     metFORMIN (GLUCOPHAGE) 500 MG tablet Take 500 mg by mouth 2 (two) times daily.     Multiple Vitamins-Minerals  (MULTIVITAMIN ADULTS 50+) TABS Take 1 tablet by mouth daily.     Sodium Chloride (NASAL MIST IN) Place 1 spray into the nose as needed (running nose).        Home: Home Living Family/patient expects to be discharged to:: Private residence Living Arrangements: Alone Available Help at Discharge: Family, Available PRN/intermittently Type of Home: Apartment Home Access: Level entry Home Layout: One level Bathroom Shower/Tub: Health visitor: Standard Home Equipment: None  Lives With: Alone   Functional History: Prior Function Prior Level of Function : Independent/Modified Independent, Driving  Functional Status:  Mobility: Bed Mobility Overal bed mobility: Needs Assistance Bed Mobility: Sit to Supine Supine to sit: Min guard Sit to supine: Min guard General bed mobility comments: up in chair upon OT entry Transfers Overall transfer level: Needs assistance Equipment used: Rolling walker (2 wheels) Transfers: Sit to/from Stand Sit to Stand: Min guard Bed to/from chair/wheelchair/BSC transfer type:: Step pivot Step pivot transfers: Mod assist General transfer comment: min gaurd to rise, up to mod A when completing functional ambulation due to L knee buckling Ambulation/Gait Ambulation/Gait assistance: Min assist, Mod assist Gait Distance (Feet): 45 Feet Assistive device: Rolling walker (2 wheels) Gait Pattern/deviations: Step-to pattern, Decreased step length - right, Decreased stance time - left, Decreased dorsiflexion - left, Knee hyperextension - left General Gait Details: L leg snapping into hyperextension with each step, pt able to correct with cues but unable to maintain without direct cues. cues for RW proximity, reduced DF of LLE with fatigue Gait velocity: decreased Gait velocity interpretation: <1.31 ft/sec, indicative of household ambulator    ADL: ADL Overall ADL's : Needs assistance/impaired Eating/Feeding: Minimal assistance Eating/Feeding  Details (indicate cue type and reason): min A for bil tasks (cutting meat, etc) Grooming: Oral care, Set up, Sitting Grooming Details (indicate cue type and reason): Pt using problem solving to overcome LUE incoordination during oral care. Upper Body Bathing: Set up, Sitting Lower Body Bathing: Minimal assistance, Sit to/from stand Upper Body Dressing : Minimal assistance Lower Body Dressing: Minimal assistance, Sitting/lateral leans Lower Body Dressing Details (indicate cue type and reason): Min A. Pt doffing L sock with significantly increased time, but unable to don due to poor coordination and dexterity as well as inability to perform figure 4 or adequately reach down to feet. Pt donning and doffing R sock with significantly incresaed time. Able to perform modified figure 4 Toilet Transfer: Minimal assistance, Moderate assistance, Stand-pivot, BSC/3in1, Rolling walker (2 wheels) Toilet Transfer Details (indicate cue type and reason): Max multimodal cues to coordinate RW and BLE. Toileting- Clothing Manipulation and Hygiene: Moderate assistance, Sit to/from stand Functional mobility during ADLs: Minimal assistance, Moderate assistance, Rolling walker (2 wheels) General ADL Comments: Min-mod A for functional mobility. Up to max cues needed with fatigue  Cognition: Cognition Overall Cognitive Status: Within Functional Limits for tasks assessed Arousal/Alertness: Awake/alert Orientation Level: Oriented X4 Year: 2023 Month: October Day of Week: Correct Memory: Appears intact Awareness: Impaired Awareness Impairment: Anticipatory impairment Problem Solving: Appears intact Safety/Judgment: Other (comment) Comments: Patient does not appear patient with staying too long "I'll give them a week"  and this seems to result in him perhaps downplaying his deficits even to himself Cognition Arousal/Alertness: Awake/alert Behavior During Therapy: WFL for tasks assessed/performed Overall Cognitive  Status: Within Functional Limits for tasks assessed Area of Impairment: Safety/judgement, Awareness Safety/Judgement: Decreased awareness of deficits Awareness: Emergent General Comments: emerging safety awareness, however requires cues when navigating (turning with walker in the hallway)  Physical Exam: Blood pressure (!) 140/76, pulse 76, temperature 97.8 F (36.6 C), temperature source Oral, resp. rate 17, height 5\' 8"  (1.727 m), weight 78.3 kg, SpO2 97 %. Physical Exam Constitutional:      Appearance: Normal appearance.  HENT:     Head: Normocephalic and atraumatic.     Nose: Nose normal.     Mouth/Throat:     Pharynx: Oropharynx is clear.  Eyes:     General: No scleral icterus.    Extraocular Movements: Extraocular movements intact.     Pupils: Pupils are equal, round, and reactive to light.  Cardiovascular:     Rate and Rhythm: Normal rate and regular rhythm.     Heart sounds: No murmur heard.    No gallop.  Pulmonary:     Effort: Pulmonary effort is normal. No respiratory distress.     Breath sounds: Normal breath sounds. No wheezing.  Abdominal:     General: There is no distension.     Palpations: Abdomen is soft. There is no mass.  Musculoskeletal:        General: Swelling (bilateral pretibial edema 1+) present.     Cervical back: Normal range of motion.  Skin:    Comments: A few scattered bruises and abrasions. Chronic vascular changes in the distal LE's. Old surgical decompression scar right anterior neck.   Neurological:     Mental Status: He is alert.     Comments: Alert and oriented x 3. Normal insight and awareness. Intact Memory. Normal language. No dysarthria but he hypophonates/voice is hoarse. CN exam unremarkable.  LUE 4-/5 deltoid 4+ biceps, triceps, 4 to 4+ with wrist flexion and grip. LLE 4/5 prox to distal. RUE and RLE 5/5 prox to distal. ?mild proprioceptive deficits in left hand (?chronic) otherwise no abnl in pain r light touch appreciated. No resting  tone or cerebellar signs. FMC decreased on left.     Psychiatric:        Mood and Affect: Mood normal.        Behavior: Behavior normal.     Results for orders placed or performed during the hospital encounter of 07/09/22 (from the past 48 hour(s))  TSH     Status: None   Collection Time: 07/12/22  4:35 AM  Result Value Ref Range   TSH 4.224 0.350 - 4.500 uIU/mL    Comment: Performed by a 3rd Generation assay with a functional sensitivity of <=0.01 uIU/mL. Performed at [facility], [address], [place], [place]   T4, free     Status: None   Collection Time: 07/12/22  4:35 AM  Result Value Ref Range   Free T4 0.97 0.61 - 1.12 ng/dL    Comment: (NOTE) Biotin ingestion may interfere with free T4 tests. If the results are inconsistent with the TSH level, previous test results, or the clinical presentation, then consider biotin interference. If needed, order repeat testing after stopping biotin. Performed at [facility], [address], [place], [place]   Cortisol     Status: None   Collection Time: 07/12/22  4:35 AM  Result Value  Ref Range   Cortisol, Plasma 15.5 ug/dL    Comment: (NOTE) AM    6.7 - 22.6 ug/dL PM   <10.0       ug/dL Performed at [facility] [address] [place], [place] [address]   Basic metabolic panel     Status: Abnormal   Collection Time: 07/12/22  4:35 AM  Result Value Ref Range   Sodium 134 (L) 135 - 145 mmol/L   Potassium 3.9 3.5 - 5.1 mmol/L   Chloride 97 (L) 98 - 111 mmol/L   CO2 25 22 - 32 mmol/L   Glucose, Bld 164 (H) 70 - 99 mg/dL    Comment: Glucose reference range applies only to samples taken after fasting for at least 8 hours.   BUN 18 8 - 23 mg/dL   Creatinine, Ser 0.81 0.61 - 1.24 mg/dL   Calcium 8.8 (L) 8.9 - 10.3 mg/dL   GFR, Estimated >60 >60 mL/min    Comment: (NOTE) Calculated using the CKD-EPI Creatinine Equation (2021)    Anion gap 12 5 - 15    Comment: Performed at [facility] [address], [place], [place] [address]  Magnesium     Status: None   Collection Time: 07/12/22  4:35 AM  Result Value Ref Range   Magnesium 1.8 1.7 - 2.4 mg/dL    Comment: Performed at [facility] [address] [place], [place] [address]   No results found.    Blood pressure (!) 140/76, pulse 76, temperature 97.8 F (36.6 C), temperature source Oral, resp. rate 17, height 5\' 8"  (1.727 m), weight 78.3 kg, SpO2 97 %.  Medical Problem List and Plan: 1. 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 Pt with new left sided weakness although he had some prior LUE weakness from a cervical spondylosis/myelopathy (hx of ACDF)  -patient may shower  -ELOS/Goals: 7-10 days, mod I goals with PT and OT. Pt has some nearby family and friends who can check in on him. 1 level home with level entry. 2.  Antithrombotics: -DVT/anticoagulation:  Pharmaceutical: Lovenox  -antiplatelet therapy: Aspirin and Plavix x3 weeks then Plavix alone 3. Pain Management: Tylenol as needed 4. Mood/Behavior/Sleep: Provide emotional support  -antipsychotic agents: N/A 5. Neuropsych/cognition: This patient is capable of making decisions on his own behalf. 6. Skin/Wound Care: Routine skin check 7. Fluids/Electrolytes/Nutrition: Routine in and outs with follow-up chemistries 8.  Permissive hypertension.  Patient on Norvasc 10 mg daily as well as Zestoretic 20-12.5 mg daily prior to admission.  Resume as needed  -bp presently controlled 9.  Tobacco/alcohol use.  NicoDerm patch.  Provide counseling 10.  Hypothyroidism.  Synthroid 11.  Diabetes mellitus.  Hemoglobin A1c 6.8.  Resume Glucophage 500 mg twice daily  -monitor sugars AC and HS 12.  Hyperlipidemia.  Lipitor 13.  Hyponatremia.  Continue sodium chloride tabs for now.  Follow-up chemistries upon admission 14.  History of pulmonary nodules.  Follow-up CT 3 to 6 months.  Lavon Paganini Angiulli,  PA-C 07/13/2022

## 2022-07-13 NOTE — Progress Notes (Signed)
Inpatient Rehab Admissions Coordinator:   [facility] [place] approved patient for admission to CIR. Planning to admit today as we have bed available. Attending MD verifies patient is ready for discharge.   Rehab Admissons Coordinator [place], [place], [place] [phone]

## 2022-07-13 NOTE — Discharge Summary (Signed)
[facility]  Physician Discharge Summary   Patient ID: Vincent Vincent [name] MRN: [id number] DOB/AGE: 03/05/1946 76 y.o.  Admit date: 07/09/2022 Discharge date:   07/13/2022   PCP: Vincent Vincent [name]:    Acute CVA (cerebrovascular accident) ([name])   Alcohol dependence ([name])   HTN (hypertension)   DM2 (diabetes mellitus, type 2) ([name])   Tobacco abuse   Lower extremity edema   Hypothyroidism   Hyponatremia  Patient being discharged to [name].  CODE STATUS: Full code  DISCHARGE CONDITION: fair  Diet recommendation: Heart healthy  INITIAL HISTORY: 76 y.o. male with medical history significant for HTN, DM2, hypothyroidism.  Patient presented with left leg weakness.  He was found to have an acute stroke.  Initially he left AGAINST MEDICAL ADVICE but then came back the next day due to persistent weakness.     Consultants: Neurology   Procedures:  Transthoracic echocardiogram.   Lower extremity Doppler study.   [name] COURSE:   Acute stroke Patient has left hemiplegia.  Patient underwent MRI brain and CT angiogram head and neck. Patient was seen by neurology. LDL is 109.  Patient noted to be on statin. HbA1c 6.8. Patient was loaded with Plavix.   Neurology recommends aspirin and Plavix for 3 weeks and then Plavix alone.   Echocardiogram showed normal systolic function.  No DVT identified on Doppler studies. Seen by PT and OT.  Inpatient rehabilitation is recommended.  SVT Telemetry showed brief run of SVT.  Regular.  No evidence for atrial fibrillation.  Patient denies any symptoms.  Baseline heart rate in the 60s.  Will not prescribe any beta-blocker.  May need additional work-up if he has recurrent episodes or if he becomes symptomatic.  Thyroid function tests were normal.   Left lower extremity edema Lower extremity Doppler studies negative for DVT   Diabetes mellitus type 2, controlled HbA1c is 6.8.   Continue SSI.   Essential hypertension Monitor blood pressures.   Left upper lobe pulmonary nodules Incidentally noted on CT angiogram.  Patient is aware of these and is followed for the same at the [name].  He gets yearly CT scans for same.   Alcohol abuse Drinks 2 beers a day.  Does not seem to be excessive.  Denies any liquor use.  Placed on CIWA protocol. Thiamine folate multivitamins. Did not have any withdrawal symptoms in the [name].   Hyponatremia Levels noted to be within the upper 120s.  Urine osmolality was noted to be elevated at 687.  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  He is asymptomatic.   Improved with salt tablets.  We will cut back on the dose of the salt tablets since his blood pressure noted to be elevated. Thyroid hormone levels and cortisol levels normal.   Further management and evaluation in the outpatient setting. Can recheck basic metabolic panel along with magnesium level in a few days.   Hypothyroidism Continue home medications.  TSH and free T4 levels are normal.     Patient is stable.  Okay for discharge to [name] today.   PERTINENT LABS:  The results of significant diagnostics from this hospitalization (including imaging, microbiology, ancillary and laboratory) are listed below for reference.     Labs:   Basic Metabolic Panel: Recent Labs  Lab 07/10/22 0510 07/10/22 0755 07/10/22 1502 07/11/22 0443 07/12/22 0435  NA 127* 129* 129* 129* 134*  K 3.7 3.8 3.7 3.6 3.9  CL 92* 95*  93* 94* 97*  CO2 GLUCOSE 163* 149* 137* 155* 164*  BUN CREATININE 0.81 0.93 0.90 0.89 0.81  CALCIUM 8.8* 8.7* 9.3 8.6* 8.8*  MG  --   --   --   --  1.8  PHOS 3.0 2.9 2.8  --   --    Liver Function Tests: Recent Labs  Lab 07/08/22 0930 07/10/22 0510 07/10/22 0755 07/10/22 1502  AST 26  --   --   --   ALT 29  --   --   --   ALKPHOS 69  --   --   --   BILITOT 0.8  --    --   --   PROT 7.5  --   --   --   ALBUMIN 4.2 3.8 3.6 4.0    CBC: Recent Labs  Lab 07/08/22 0930 07/08/22 0944 07/09/22 0935 07/10/22 0510 07/11/22 0443  WBC 12.3*  --  12.7* 13.1* 13.6*  NEUTROABS 7.9*  --   --  9.3*  --   HGB 17.6* 17.3* 16.5 16.2 15.5  HCT 49.4 51.0 46.3 43.6 42.9  MCV 89.5  --  89.0 87.6 88.6  PLT 259  --  206 207 208    CBG: Recent Labs  Lab 07/08/22 0932 07/10/22 0204  GLUCAP 235* 185*     IMAGING STUDIES ECHOCARDIOGRAM COMPLETE  Result Date: 07/10/2022    ECHOCARDIOGRAM REPORT   Patient Name:   Vincent Vincent [name] Date of Exam: 07/10/2022 Medical Rec #:  [id number]    Height:       68.0 in Accession #:    [id number]   Weight:       172.6 lb Date of Birth:  May 21, 1946     BSA:          1.920 m Patient Age:    76 years     BP:           155/70 mmHg Patient Gender: M            HR:           82 bpm. Exam Location:  Inpatient Procedure: 2D Echo, Cardiac Doppler and Color Doppler Indications:    Stroke  History:        Patient has no prior history of Echocardiogram examinations.                 Risk Factors:Hypertension, Diabetes and Dyslipidemia.  Sonographer:    [name] RDCS (AE) Referring Phys: Teddy Spike  Sonographer Comments: Technically difficult study due to poor echo windows. IMPRESSIONS  1. Left ventricular ejection fraction, by estimation, is 60 to 65%. The left ventricle has normal function. The left ventricle has no regional wall motion abnormalities. There is moderate left ventricular hypertrophy. Left ventricular diastolic parameters were normal.  2. Right ventricular systolic function is normal. The right ventricular size is normal. Tricuspid regurgitation signal is inadequate for assessing PA pressure.  3. The mitral valve is normal in structure. Trivial mitral valve regurgitation. No evidence of mitral stenosis.  4. The aortic valve is grossly normal. Aortic valve regurgitation is not visualized. No aortic stenosis is present.  5. The inferior  vena cava is normal in size with greater than 50% respiratory variability, suggesting right atrial pressure of 3 mmHg.  6. Cannot exclude a small PFO. 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 Left ventricular ejection fraction, by estimation, is  60 to 65%. The left ventricle has normal function. The left ventricle has no regional wall motion abnormalities. The left ventricular internal cavity size was normal in size. There is moderate left ventricular hypertrophy. Left ventricular diastolic parameters were normal. Right Ventricle: The right ventricular size is normal. No increase in right ventricular wall thickness. Right ventricular systolic function is normal. Tricuspid regurgitation signal is inadequate for assessing PA pressure. Left Atrium: Left atrial size was normal in size. Right Atrium: Right atrial size was normal in size. Pericardium: There is no evidence of pericardial effusion. Presence of epicardial fat layer. Mitral Valve: Chordal SAM. The mitral valve is normal in structure. Trivial mitral valve regurgitation. No evidence of mitral valve stenosis. Tricuspid Valve: The tricuspid valve is normal in structure. Tricuspid valve regurgitation is trivial. No evidence of tricuspid stenosis. Aortic Valve: The aortic valve is grossly normal. Aortic valve regurgitation is not visualized. No aortic stenosis is present. Aortic valve mean gradient measures 5.0 mmHg. Aortic valve peak gradient measures 7.8 mmHg. Aortic valve area, by VTI measures 3.36 cm. Pulmonic Valve: The pulmonic valve was not well visualized. Pulmonic valve regurgitation is not visualized. No evidence of pulmonic stenosis. Aorta: The aortic root is normal in size and structure. Venous: The inferior vena cava is normal in size with greater than 50% respiratory variability, suggesting right atrial pressure of 3 mmHg. IAS/Shunts: There is redundancy of the interatrial septum. Cannot exclude a small PFO.  LEFT  VENTRICLE PLAX 2D LVIDd:         4.40 cm   Diastology LVIDs:         2.70 cm   LV e' medial:    8.49 cm/s LV PW:         1.30 cm   LV E/e' medial:  8.7 LV IVS:        1.40 cm   LV e' lateral:   9.25 cm/s LVOT diam:     2.20 cm   LV E/e' lateral: 8.0 LV SV:         102 LV SV Index:   53 LVOT Area:     3.80 cm  RIGHT VENTRICLE RV Basal diam:  2.90 cm RV S prime:     16.20 cm/s TAPSE (M-mode): 2.6 cm LEFT ATRIUM             Index        RIGHT ATRIUM           Index LA diam:        3.40 cm 1.77 cm/m   RA Area:     15.10 cm LA Vol (A2C):   67.4 ml 35.11 ml/m  RA Volume:   37.50 ml  19.53 ml/m LA Vol (A4C):   18.2 ml 9.48 ml/m LA Biplane Vol: 38.7 ml 20.16 ml/m  AORTIC VALVE AV Area (Vmax):    4.02 cm AV Area (Vmean):   3.62 cm AV Area (VTI):     3.36 cm AV Vmax:           140.00 cm/s AV Vmean:          102.000 cm/s AV VTI:            0.304 m AV Peak Grad:      7.8 mmHg AV Mean Grad:      5.0 mmHg LVOT Vmax:         148.00 cm/s LVOT Vmean:        97.200 cm/s LVOT VTI:  0.269 m LVOT/AV VTI ratio: 0.88  AORTA Ao Root diam: 3.40 cm Ao Asc diam:  3.00 cm MITRAL VALVE MV Area (PHT): 2.69 cm    SHUNTS MV Decel Time: 282 msec    Systemic VTI:  0.27 m MV E velocity: 73.90 cm/s  Systemic Diam: 2.20 cm MV A velocity: 86.20 cm/s MV E/A ratio:  0.86 Weston Brass MD Electronically signed by Weston Brass MD Signature Date/Time: 07/10/2022/12:20:26 PM    Final    VAS [place] LOWER EXTREMITY VENOUS (DVT)  Result Date: 07/10/2022  Lower Venous DVT Study Patient Name:  Vincent Vincent [name]  Date of Exam:   07/10/2022 Medical Rec #: [id number]     Accession #:    [id number] Date of Birth: 01-Dec-1945      Patient Gender: M Patient Age:   75 years Exam Location:  [facility] Procedure:      VAS [place] LOWER EXTREMITY VENOUS (DVT) Referring Phys: Margie Ege --------------------------------------------------------------------------------  Indications: Stroke.  Comparison Study: No previous exams Performing Technologist: Jody  Hill RVT, RDMS  Examination Guidelines: A complete evaluation includes B-mode imaging, spectral Doppler, color Doppler, and power Doppler as needed of all accessible portions of each vessel. Bilateral testing is considered an integral part of a complete examination. Limited examinations for reoccurring indications may be performed as noted. The reflux portion of the exam is performed with the patient in reverse Trendelenburg.  +---------+---------------+---------+-----------+----------+--------------+ RIGHT    CompressibilityPhasicitySpontaneityPropertiesThrombus Aging +---------+---------------+---------+-----------+----------+--------------+ CFV      Full           Yes      Yes                                 +---------+---------------+---------+-----------+----------+--------------+ SFJ      Full                                                        +---------+---------------+---------+-----------+----------+--------------+ FV Prox  Full           Yes      Yes                                 +---------+---------------+---------+-----------+----------+--------------+ FV Mid   Full           Yes      Yes                                 +---------+---------------+---------+-----------+----------+--------------+ FV DistalFull           Yes      Yes                                 +---------+---------------+---------+-----------+----------+--------------+ PFV      Full                                                        +---------+---------------+---------+-----------+----------+--------------+ POP  Full           Yes      Yes                                 +---------+---------------+---------+-----------+----------+--------------+ PTV      Full                                                        +---------+---------------+---------+-----------+----------+--------------+ PERO     Full                                                         +---------+---------------+---------+-----------+----------+--------------+   +---------+---------------+---------+-----------+----------+--------------+ LEFT     CompressibilityPhasicitySpontaneityPropertiesThrombus Aging +---------+---------------+---------+-----------+----------+--------------+ CFV      Full           Yes      Yes                                 +---------+---------------+---------+-----------+----------+--------------+ SFJ      Full                                                        +---------+---------------+---------+-----------+----------+--------------+ FV Prox  Full           Yes      Yes                                 +---------+---------------+---------+-----------+----------+--------------+ FV Mid   Full           Yes      Yes                                 +---------+---------------+---------+-----------+----------+--------------+ FV DistalFull           Yes      Yes                                 +---------+---------------+---------+-----------+----------+--------------+ PFV      Full                                                        +---------+---------------+---------+-----------+----------+--------------+ POP      Full           Yes      Yes                                 +---------+---------------+---------+-----------+----------+--------------+ PTV      Full                                                        +---------+---------------+---------+-----------+----------+--------------+  PERO     Full                                                        +---------+---------------+---------+-----------+----------+--------------+     Summary: BILATERAL: - No evidence of deep vein thrombosis seen in the lower extremities, bilaterally. -No evidence of popliteal cyst, bilaterally.   *See table(s) above for measurements and observations. Electronically signed by Sherald Hess MD on 07/10/2022 at  12:06:14 PM.    Final    DG CHEST PORT 1 VIEW  Result Date: 07/09/2022 CLINICAL DATA:  Weakness, hypertension, asthma EXAM: PORTABLE CHEST 1 VIEW COMPARISON:  03/15/2011 FINDINGS: Normal cardiac and mediastinal contours. No focal pulmonary opacity. No pleural effusion or pneumothorax. No acute osseous abnormality. Status post ACDF. IMPRESSION: No active disease. Electronically Signed   By: Wiliam Ke M.D.   On: 07/09/2022 14:11   MR Brain W and Wo Contrast  Result Date: 07/09/2022 CLINICAL DATA:  Provided history: Neuro deficit, acute, stroke suspected. Weakness in left arm and left leg. EXAM: MRI HEAD WITHOUT AND WITH CONTRAST TECHNIQUE: Multiplanar, multiecho pulse sequences of the brain and surrounding structures were obtained without and with intravenous contrast. CONTRAST:  17mL GADAVIST GADOBUTROL 1 MMOL/ML IV SOLN COMPARISON:  CT angiogram head/neck 07/08/2022. Non-contrast head CT 07/08/2022. FINDINGS: Intermittently motion degraded examination, somewhat limiting evaluation. Most notably, the coronal T2 sequence is severely motion degraded. Brain: Mild generalized cerebral atrophy. 20 x 8 mm acute infarct within the right corona radiata/lentiform nucleus. Background mild multifocal T2 FLAIR hyperintense signal abnormality within the cerebral white matter, nonspecific but compatible with chronic small vessel ischemic disease. No evidence of an intracranial mass. No extra-axial fluid collection. No midline shift. No pathologic intracranial enhancement identified. Vascular: Maintained flow voids within the proximal large arterial vessels. Skull and upper cervical spine: No focal suspicious marrow lesion. Susceptibility artifact arising from ACDF hardware. Sinuses/Orbits: No mass or acute finding within the imaged orbits. Complete T2 hyperintense opacification of the right frontal sinus. Mild mucosal thickening within the left frontal sinus. Moderate mucosal thickening within the bilateral ethmoid  air cells. Mild mucosal thickening within the bilateral sphenoid and maxillary sinuses. IMPRESSION: 20 x 8 mm acute infarct within the right corona radiata/basal ganglia. Background mild chronic small vessel ischemic disease within the cerebral white matter. Mild generalized cerebral atrophy. Electronically Signed   By: Jackey Loge D.O.   On: 07/09/2022 12:10   CT ANGIO HEAD NECK W WO CM  Result Date: 07/08/2022 CLINICAL DATA:  Left-sided weakness. EXAM: CT ANGIOGRAPHY HEAD AND NECK TECHNIQUE: Multidetector CT imaging of the head and neck was performed using the standard protocol during bolus administration of intravenous contrast. Multiplanar CT image reconstructions and MIPs were obtained to evaluate the vascular anatomy. Carotid stenosis measurements (when applicable) are obtained utilizing NASCET criteria, using the distal internal carotid diameter as the denominator. RADIATION DOSE REDUCTION: This exam was performed according to the departmental dose-optimization program which includes automated exposure control, adjustment of the mA and/or kV according to patient size and/or use of iterative reconstruction technique. CONTRAST:  52mL OMNIPAQUE IOHEXOL 350 MG/ML SOLN COMPARISON:  None Available. FINDINGS: CTA NECK FINDINGS Aortic arch: Standard 3 vessel aortic arch with mild atherosclerotic plaque. No stenosis of the arch vessel origins. Right carotid system: Patent with a small amount of calcified and soft plaque  in the carotid bulb. No evidence of a significant stenosis or dissection. Tortuous mid cervical ICA. Left carotid system: Patent with a small amount of predominantly calcified plaque in the carotid bulb. No evidence of a significant stenosis or dissection. Tortuous mid cervical ICA. Vertebral arteries: Patent and codominant without evidence of a significant stenosis or dissection. Skeleton: C3-C7 ACDF widespread cervical facet arthrosis which is asymmetrically severe on the right. Other neck: No  evidence of cervical lymphadenopathy or mass. Diminutive or absent right thyroid lobe. Upper chest: 6 mm subpleural nodule in the left upper lobe (series 5, image 19). Additional 3 mm nodule in the left upper lobe (series 5, image 26). Review of the MIP images confirms the above findings CTA HEAD FINDINGS Anterior circulation: The internal carotid arteries are patent from skull base to carotid termini with atherosclerosis resulting in mild bilateral cavernous and mild right and moderate left proximal supraclinoid stenoses. ACAs and MCAs are patent without evidence of a proximal branch occlusion or significant proximal stenosis. No aneurysm is identified. Posterior circulation: The intracranial vertebral arteries are patent to the basilar with atherosclerotic irregularity and up to mild stenosis bilaterally. The basilar artery is patent with a mild stenosis in its midportion. There is a small left posterior communicating artery. Both PCAs are patent without evidence of a significant proximal stenosis on the right. There is a severe proximal left P2 stenosis. No aneurysm is identified. Venous sinuses: Patent. Anatomic variants: None. Review of the MIP images confirms the above findings These results were called by telephone at the time of interpretation on 07/08/2022 at 10:10 am to Dr. Elayne SnareVictoria Kingsley, who verbally acknowledged these results. IMPRESSION: 1. No emergent large vessel occlusion. 2. Intracranial atherosclerosis including moderate left and mild right ICA stenoses, mild distal vertebral and basilar artery stenoses, and a severe left P2 stenosis. 3. Widely patent cervical carotid arteries. 4. Two left upper lobe pulmonary nodules measuring up to 6 mm. Per Fleischner Society Guidelines, recommend a non-contrast Chest CT at 3-6 months, then consider another non-contrast Chest CT at 18-24 months. If patient is low risk for malignancy, non-contrast Chest CT at 18-24 months is optional. These guidelines do not  apply to immunocompromised patients and patients with cancer. Follow up in patients with significant comorbidities as clinically warranted. For lung cancer screening, adhere to Lung-RADS guidelines. Reference: Radiology. 2017; 284(1):228-43. 5.  Aortic Atherosclerosis (ICD10-I70.0). Electronically Signed   By: Sebastian AcheAllen  Grady M.D.   On: 07/08/2022 10:26   CT HEAD CODE STROKE WO CONTRAST  Result Date: 07/08/2022 CLINICAL DATA:  Code stroke.  Left-sided weakness. EXAM: CT HEAD WITHOUT CONTRAST TECHNIQUE: Contiguous axial images were obtained from the base of the skull through the vertex without intravenous contrast. RADIATION DOSE REDUCTION: This exam was performed according to the departmental dose-optimization program which includes automated exposure control, adjustment of the mA and/or kV according to patient size and/or use of iterative reconstruction technique. COMPARISON:  None Available. FINDINGS: Brain: There is no evidence of an acute infarct, intracranial hemorrhage, mass, midline shift, or extra-axial fluid collection. The ventricles and sulci are within normal limits for age. Hypodensities in the cerebral white matter bilaterally are nonspecific but compatible with mild chronic small vessel ischemic disease. Vascular: Calcified atherosclerosis at the skull base. No hyperdense vessel. Skull: No fracture. Prominent periapical lucency involving a right maxillary molar tooth. Sinuses/Orbits: Mild mucosal thickening in the paranasal sinuses. Clear mastoid air cells. Unremarkable orbits. Other: None. ASPECTS [name] [place] Stroke Program Early CT Score) - Ganglionic level infarction (caudate,  lentiform nuclei, internal capsule, insula, M1-M3 cortex): 7 - Supraganglionic infarction (M4-M6 cortex): 3 Total score (0-10 with 10 being normal): 10 IMPRESSION: 1. No evidence of acute intracranial abnormality. ASPECTS of 10. 2. Mild chronic small vessel ischemic disease. These results were called by telephone at the time  of interpretation on 07/08/2022 at 10:10 am to Dr. Elayne Snare, who verbally acknowledged these results. Electronically Signed   By: Sebastian Ache M.D.   On: 07/08/2022 10:10    DISCHARGE EXAMINATION: Vitals:   07/12/22 1948 07/12/22 2349 07/13/22 0342 07/13/22 0835  BP: (!) 146/78 (!) 144/75 (!) 151/70 (!) 140/76  Pulse: 72 73 68 76  Resp: Temp: (!) 97.5 F (36.4 C) 97.9 F (36.6 C) 97.7 F (36.5 C) 97.8 F (36.6 C)  TempSrc: Oral Oral Oral Oral  SpO2: 96% 94% 95% 97%  Weight:      Height:       General appearance: Awake alert.  In no distress Resp: Clear to auscultation bilaterally.  Normal effort Cardio: S1-S2 is normal regular.  No S3-S4.  No rubs murmurs or bruit GI: Abdomen is soft.  Nontender nondistended.  Bowel sounds are present normal.  No masses organomegaly    DISPOSITION: CIR  Discharge Instructions     Ambulatory referral to Neurology   Complete by: As directed    Follow up with stroke clinic NP (Jessica Vanschaick or Darrol Angel, if both not available, consider Manson Allan, or [facility]) at [name] in about 4 weeks. Thanks.        Current Inpatient Medications: Scheduled:  aspirin EC  81 mg Oral Daily   atorvastatin  40 mg Oral Daily   clopidogrel  75 mg Oral Daily   enoxaparin (LOVENOX) injection  40 mg Subcutaneous Q24H   folic acid  1 mg Oral Daily   levothyroxine  125 mcg Oral Q0600   multivitamin with minerals  1 tablet Oral Daily   nicotine  21 mg Transdermal Daily   sodium chloride  1 g Oral BID WC   thiamine  100 mg Oral Daily   Or   thiamine  100 mg Intravenous Daily   Continuous: [id number] **OR** [DISCONTINUED] acetaminophen (TYLENOL) oral liquid 160 mg/5 mL **OR** [DISCONTINUED] acetaminophen, albuterol, senna-docusate     Follow-up Information     [facility]. Schedule an appointment as soon as possible for a visit in 1 month(s).   Specialty: Neurology Why: stroke clinic Contact  information: [address] [place] [address] [phone]                TOTAL DISCHARGE TIME: 35 minutes  Jalaiyah Throgmorton Rito Ehrlich  [facility] Pager on [email or web address]  07/13/2022, 9:23 AM

## 2022-07-14 LAB — CBC WITH DIFFERENTIAL/PLATELET
Abs Immature Granulocytes: 0.03 10*3/uL (ref 0.00–0.07)
Basophils Absolute: 0.1 10*3/uL (ref 0.0–0.1)
Basophils Relative: 1 %
Eosinophils Absolute: 0.4 10*3/uL (ref 0.0–0.5)
Eosinophils Relative: 4 %
HCT: 41.6 % (ref 39.0–52.0)
Hemoglobin: 14.9 g/dL (ref 13.0–17.0)
Immature Granulocytes: 0 %
Lymphocytes Relative: 24 %
Lymphs Abs: 2.4 10*3/uL (ref 0.7–4.0)
MCH: 31.7 pg (ref 26.0–34.0)
MCHC: 35.8 g/dL (ref 30.0–36.0)
MCV: 88.5 fL (ref 80.0–100.0)
Monocytes Absolute: 0.6 10*3/uL (ref 0.1–1.0)
Monocytes Relative: 6 %
Neutro Abs: 6.4 10*3/uL (ref 1.7–7.7)
Neutrophils Relative %: 65 %
Platelets: 209 10*3/uL (ref 150–400)
RBC: 4.7 MIL/uL (ref 4.22–5.81)
RDW: 11.9 % (ref 11.5–15.5)
WBC: 10 10*3/uL (ref 4.0–10.5)
nRBC: 0 % (ref 0.0–0.2)

## 2022-07-14 LAB — COMPREHENSIVE METABOLIC PANEL
ALT: 38 U/L (ref 0–44)
AST: 34 U/L (ref 15–41)
Albumin: 3.4 g/dL — ABNORMAL LOW (ref 3.5–5.0)
Alkaline Phosphatase: 59 U/L (ref 38–126)
Anion gap: 8 (ref 5–15)
BUN: 19 mg/dL (ref 8–23)
CO2: 28 mmol/L (ref 22–32)
Calcium: 8.7 mg/dL — ABNORMAL LOW (ref 8.9–10.3)
Chloride: 96 mmol/L — ABNORMAL LOW (ref 98–111)
Creatinine, Ser: 0.84 mg/dL (ref 0.61–1.24)
GFR, Estimated: >60 mL/min (ref >60)
Glucose, Bld: 233 mg/dL — ABNORMAL HIGH (ref 70–99)
Potassium: 3.8 mmol/L (ref 3.5–5.1)
Sodium: 132 mmol/L — ABNORMAL LOW (ref 135–145)
Total Bilirubin: 0.6 mg/dL (ref 0.3–1.2)
Total Protein: 6.1 g/dL — ABNORMAL LOW (ref 6.5–8.1)

## 2022-07-14 LAB — GLUCOSE, CAPILLARY
Glucose-Capillary: 152 mg/dL — ABNORMAL HIGH (ref 70–99)
Glucose-Capillary: 129 mg/dL — ABNORMAL HIGH (ref 70–99)
Glucose-Capillary: 150 mg/dL — ABNORMAL HIGH (ref 70–99)
Glucose-Capillary: 192 mg/dL — ABNORMAL HIGH (ref 70–99)

## 2022-07-14 NOTE — Evaluation (Signed)
Physical Therapy Assessment and Plan  Patient Details  Name: Vincent [name] MRN: [id number] Date of Birth: 04/25/1946  PT Diagnosis: Difficulty walking, Hemiplegia non-dominant, and Muscle weakness Rehab Potential: Good ELOS: 10-12 Days   Today's Date: 07/14/2022 PT Individual Time: 1102-1200 and 1418-1530 PT Individual Time Calculation (min): 58 min  and 28 min  Hospital Problem: Principal Problem:   Cerebrovascular accident (CVA) of right basal ganglia ([place])   Past Medical History:  Past Medical History:  Diagnosis Date   Asthma    Depression    Diabetes mellitus    Hypertension    Numbness    Stroke ([facility])    Suicide and self-inflicted injury ([place]) Aug, 2012   cut wrist   Past Surgical History:  Past Surgical History:  Procedure Laterality Date   APPENDECTOMY     SHOULDER ARTHROSCOPY     TONSILLECTOMY      Assessment & Plan Clinical Impression: Patient is a 76 year old right-handed male with history of depression as well as 2012, diabetes mellitus, stable left upper lobe pulmonary nodules, papillary cancer, hypertension, asthma with tobacco/alcohol use.  Per chart review patient lives alone.  Independent prior to admission.  1 level apartment.  Presented 07/09/2022 with acute onset of left-sided weakness.  Cranial CT scan negative.  CT no emergent large vessel occlusion.  2 left upper lobe pulmonary nodules measuring 6 mm.  Recommendations noncontrast CT 3 to 6 months.  Patient did not receive tPA.  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  Admission chemistries unremarkable except sodium 130 glucose 203, alcohol negative, urine drug screen negative, WBC 12,700, hemoglobin A1c 6.8.  Echo with ejection fraction of 60 to 65% no wall motion abnormalities. Maintained on low-dose aspirin as well as Plavix for CVA prophylaxis for 3 weeks then Plavix alone.  Subcutaneous Lovenox for DVT prophylaxis.  Venous Doppler studies negative.  Patient  transferred to CIR on 07/13/2022 .   Patient currently requires mod with mobility secondary to muscle weakness, decreased cardiorespiratoy endurance, impaired timing and sequencing, motor apraxia, and decreased coordination, and decreased sitting balance, decreased standing balance, decreased postural control, hemiplegia, and decreased balance strategies.  Prior to hospitalization, patient was independent  with mobility and lived with Alone in a [place] home.  Home access is  Level entry.  Patient will benefit from skilled PT intervention to maximize safe functional mobility, minimize fall risk, and decrease caregiver burden for planned discharge home with intermittent assist.  Anticipate patient will benefit from follow up OP at discharge.  PT - End of Session Endurance Deficit: Yes Endurance Deficit Description: Rest breaks within BADL tasks   PT Evaluation Precautions/Restrictions Precautions Precautions: Fall Precaution Comments: Impulsive Restrictions Weight Bearing Restrictions: No Pain Pain Assessment Pain Scale: 0-10 Pain Score: 0-No pain Pain Interference Pain Interference Pain Effect on Sleep: 1. Rarely or not at all Pain Interference with Therapy Activities: 1. Rarely or not at all Pain Interference with Day-to-Day Activities: 1. Rarely or not at all Home Living/Prior [place] Available Help at Discharge: Family;Available PRN/intermittently Type of Home: Apartment Home Access: Level entry Home Layout: One level Bathroom Shower/Tub: Multimedia programmer: Standard  Lives With: Alone Prior Function Level of Independence: Independent with basic ADLs;Independent with homemaking with ambulation Driving: Yes Vision/Perception  Vision - History Ability to See in Adequate Light: 0 Adequate Perception Perception: Impaired Inattention/Neglect: Does not attend to left visual field Praxis Praxis: Impaired Praxis Impairment Details: Motor  planning  Cognition Overall Cognitive  Status: Impaired/Different from baseline Arousal/Alertness: Awake/alert Orientation Level: Oriented X4 Memory: Appears intact Awareness: Impaired Awareness Impairment: Anticipatory impairment Problem Solving: Appears intact Safety/Judgment: Impaired Sensation Sensation Light Touch: Impaired Detail Light Touch Impaired Details: Impaired LUE Additional Comments: L UE numbness at baseline since neck surgery 9 years Coordination Gross Motor Movements are Fluid and Coordinated: No Fine Motor Movements are Fluid and Coordinated: No Coordination and Movement Description: Decreased smoothness and accuracy, overshooting Finger Nose Finger Test: Dysmetria Motor  Motor Motor: Hemiplegia   Trunk/Postural Assessment  Cervical Assessment Cervical Assessment: Exceptions to [facility] (forward head) Thoracic Assessment Thoracic Assessment: Exceptions to [facility] (rounded shoulders) Lumbar Assessment Lumbar Assessment: Exceptions to [facility] (posterior pelvic tilt) Postural Control Postural Control: Deficits on evaluation (delayed and inadequate)  Balance Balance Balance Assessed: Yes Standardized Balance Assessment Standardized Balance Assessment: Berg Balance Test Berg Balance Test Sit to Stand: Able to stand without using hands and stabilize independently Standing Unsupported: Able to stand 2 minutes with supervision Sitting with Back Unsupported but Feet Supported on Floor or Stool: Able to sit safely and securely 2 minutes Stand to Sit: Sits independently, has uncontrolled descent Transfers: Needs one person to assist Standing Unsupported with Eyes Closed: Needs help to keep from falling Standing Ubsupported with Feet Together: Needs help to attain position but able to stand for 30 seconds with feet together From Standing, Reach Forward with Outstretched Arm: Reaches forward but needs supervision From Standing Position, Pick up Object from Floor: Able to pick  up shoe, needs supervision From Standing Position, Turn to Look Behind Over each Shoulder: Needs supervision when turning Turn 360 Degrees: Needs assistance while turning Standing Unsupported, Alternately Place Feet on Step/Stool: Needs assistance to keep from falling or unable to try Standing Unsupported, One Foot in Front: Loses balance while stepping or standing Standing on One Leg: Unable to try or needs assist to prevent fall Total Score: 19 Static Sitting Balance Static Sitting - Balance Support: Feet supported Static Sitting - Level of Assistance: 5: Stand by assistance Dynamic Sitting Balance Dynamic Sitting - Balance Support: Feet supported Dynamic Sitting - Level of Assistance: 4: Min assist (CGA) Static Standing Balance Static Standing - Balance Support: During functional activity Static Standing - Level of Assistance: 4: Min assist Dynamic Standing Balance Dynamic Standing - Balance Support: During functional activity Dynamic Standing - Level of Assistance: 3: Mod assist Extremity Assessment  RUE Assessment RUE Assessment: Within Functional Limits LUE Assessment LUE Assessment: Exceptions to [facility] General Strength Comments: has strength and ROM deficits from neck surgery 9 years ago, L UE FF 120, gross strength 3/;5 RLE Assessment RLE Assessment: Within Functional Limits LLE Assessment LLE Assessment: Exceptions to [facility] General Strength Comments: Grossly 3/5  Care Tool Care Tool Bed Mobility Roll left and right activity   Roll left and right assist level: Supervision/Verbal cueing    Sit to lying activity   Sit to lying assist level: Supervision/Verbal cueing    Lying to sitting on side of bed activity   Lying to sitting on side of bed assist level: the ability to move from lying on the back to sitting on the side of the bed with no back support.: Supervision/Verbal cueing     Care Tool Transfers Sit to stand transfer   Sit to stand assist level: Minimal  Assistance - Patient > 75%    Chair/bed transfer   Chair/bed transfer assist level: Minimal Assistance - Patient > 75%     Toilet transfer   Assist Level: Minimal Assistance -  Patient > 75%    Car transfer   Car transfer assist level: Minimal Assistance - Patient > 75%      Care Tool Locomotion Ambulation   Assist level: Moderate Assistance - Patient 50 - 74% Assistive device: No Device Max distance: 25'  Walk 10 feet activity   Assist level: Moderate Assistance - Patient - 50 - 74% Assistive device: No Device   Walk 50 feet with 2 turns activity Walk 50 feet with 2 turns activity did not occur: Safety/medical concerns      Walk 150 feet activity Walk 150 feet activity did not occur: Safety/medical concerns      Walk 10 feet on uneven surfaces activity   Assist level: Minimal Assistance - Patient > 75% Assistive device:  (R hand rail)  Stairs   Assist level: Minimal Assistance - Patient > 75% Stairs assistive device: 2 hand rails Max number of stairs: 12  Walk up/down 1 step activity   Walk up/down 1 step (curb) assist level: Minimal Assistance - Patient > 75% Walk up/down 1 step or curb assistive device: 2 hand rails  Walk up/down 4 steps activity   Walk up/down 4 steps assist level: Minimal Assistance - Patient > 75% Walk up/down 4 steps assistive device: 2 hand rails  Walk up/down 12 steps activity   Walk up/down 12 steps assist level: Minimal Assistance - Patient > 75% Walk up/down 12 steps assistive device: 2 hand rails  Pick up small objects from floor   Pick up small object from the floor assist level: Minimal Assistance - Patient > 75%    Wheelchair Is the patient using a wheelchair?: No          Wheel 50 feet with 2 turns activity      Wheel 150 feet activity        Refer to Care Plan for Long Term Goals  SHORT TERM GOAL WEEK 1 PT Short Term Goal 1 (Week 1): Pt will perform bed to chair transfer with CGA. PT Short Term Goal 2 (Week 1): Pt will  ambulate x100' with CGA and LRAD. PT Short Term Goal 3 (Week 1): Pt will improved Berg by [facility].  Recommendations for other services: None   Skilled Therapeutic Intervention  1st Session: Evaluation completed (see details above and below) with education on PT POC and goals and individual treatment initiated with focus on balance, ambulation, stair training, and transfers. Pt received seated in WC and agrees to therapy. No complaint of pain but reports needing to use restroom. Sit to stand with RW and cues for hand placement and sequencing, requiring minA to facilitate anterior weight shift. Pt ambulates to toilet with RW and cues for AD management. Pt requires minA to stand from toilet with cues for use of grab bar. Pt ambulates to sink with RW and minA. After washing hands, pt transported to gym via [facility] for time management. Pt performs sit to stand without AD and cues for body mechanics. Pt ambulates x25' without AD, with modA to facilitate lateral weight shifting, trunk stability, providing blocking at L knee to prevent buckling and hyperextension during midstance phase. Following, seated rest break, pt completes x12 6" steps with bilateral hand rails and minA, with cues for step sequencing and tactile cueing at L knee to prevent hyperextension. Following seated rest break, pt ambulates x150' back to room with RW and minA, with consistent tactile cueing at L knee for optimal mechanics. Pt left seated in WC with alarm intact and all  need within reach.  2nd Session: Pt received seated in [facility] and agrees to therapy. No complaint of pain. WC transport to gym for time management. Pt performs stand pivot transfer to Nustep with modA and cues for posture, sequencing, and positioning. Pt completes Nustep for strength and endurance training, as well as reciprocal coordination. Pt completes at workload of 5 with average steps per minute ~60. X1 brief seated rest break.  Stand pivot back to [facility] with modA and cues for  posture, step sequencing, and positioning. WC transport to main gym. Pt performs stand pivot to mat with modA and improved mechanics and balance. Pt then performs balance activity, tasked with standing without AD and stepping to target with R lower extremity, then stepping back to neutral. PT provides modA at hips and trunk for stability, as well as blocking L knee to prevent buckling and hyperextension. Pt completes x10 before seated rest break. Following, pt completes x20 with minA/modA. PT also provides cues to utilize mirror for visual feedback to achieve neutral posture.  Pt completes [employer], as detailed above, with PT interpretation of results to pt. WC transport back to room. Left seated with alarm intact and all needs within reach.  Mobility Bed Mobility Bed Mobility: Supine to Sit;Sit to Supine Supine to Sit: Supervision/Verbal cueing Sit to Supine: Supervision/Verbal cueing Transfers Transfers: Sit to Stand;Stand to Sit;Stand Pivot Transfers Sit to Stand: Minimal Assistance - Patient > 75% Stand to Sit: Minimal Assistance - Patient > 75% Stand Pivot Transfers: Minimal Assistance - Patient > 75% Locomotion  Gait Ambulation: Yes Gait Assistance: Minimal Assistance - Patient > 75% Gait Distance (Feet): 150 Feet Assistive device: Rolling walker Gait Assistance Details: Tactile cues for posture;Verbal cues for sequencing;Verbal cues for gait pattern;Verbal cues for safe use of DME/AE;Verbal cues for technique Gait Gait: Yes Gait Pattern: Impaired Gait Pattern:  (decreased control of L knee during stance phase. Frequent snapping into hyperextension) Gait velocity: decreased Stairs / Additional Locomotion Stairs: Yes Stairs Assistance: Minimal Assistance - Patient > 75% Stair Management Technique: Two rails Number of Stairs: 12 Height of Stairs: 6 Ramp: Minimal Assistance - Patient >75% Curb: Minimal Assistance - Patient >75% Wheelchair Mobility Wheelchair Mobility:  No   Discharge Criteria: Patient will be discharged from PT if patient refuses treatment 3 consecutive times without medical reason, if treatment goals not met, if there is a change in medical status, if patient makes no progress towards goals or if patient is discharged from hospital.  The above assessment, treatment plan, treatment alternatives and goals were discussed and mutually agreed upon: by patient  Breck Coons, PT, DPT 07/14/2022, 5:12 PM

## 2022-07-14 NOTE — Plan of Care (Signed)
  Problem: RH Balance Goal: LTG: Patient will maintain dynamic sitting balance (OT) Description: LTG:  Patient will maintain dynamic sitting balance with assistance during activities of daily living (OT) Flowsheets (Taken 07/14/2022 1259) LTG: Pt will maintain dynamic sitting balance during ADLs with: Independent Goal: LTG Patient will maintain dynamic standing with ADLs (OT) Description: LTG:  Patient will maintain dynamic standing balance with assist during activities of daily living (OT)  Flowsheets (Taken 07/14/2022 1259) LTG: Pt will maintain dynamic standing balance during ADLs with: Supervision/Verbal cueing   Problem: Sit to Stand Goal: LTG:  Patient will perform sit to stand in prep for activites of daily living with assistance level (OT) Description: LTG:  Patient will perform sit to stand in prep for activites of daily living with assistance level (OT) Flowsheets (Taken 07/14/2022 1259) LTG: PT will perform sit to stand in prep for activites of daily living with assistance level: Supervision/Verbal cueing   Problem: RH Eating Goal: LTG Patient will perform eating w/assist, cues/equip (OT) Description: LTG: Patient will perform eating with assist, with/without cues using equipment (OT) Flowsheets (Taken 07/14/2022 1259) LTG: Pt will perform eating with assistance level of: Independent with assistive device    Problem: RH Grooming Goal: LTG Patient will perform grooming w/assist,cues/equip (OT) Description: LTG: Patient will perform grooming with assist, with/without cues using equipment (OT) Flowsheets (Taken 07/14/2022 1259) LTG: Pt will perform grooming with assistance level of: Independent with assistive device    Problem: RH Bathing Goal: LTG Patient will bathe all body parts with assist levels (OT) Description: LTG: Patient will bathe all body parts with assist levels (OT) Flowsheets (Taken 07/14/2022 1259) LTG: Pt will perform bathing with assistance level/cueing:  Supervision/Verbal cueing   Problem: RH Dressing Goal: LTG Patient will perform upper body dressing (OT) Description: LTG Patient will perform upper body dressing with assist, with/without cues (OT). Flowsheets (Taken 07/14/2022 1259) LTG: Pt will perform upper body dressing with assistance level of: Independent with assistive device Goal: LTG Patient will perform lower body dressing w/assist (OT) Description: LTG: Patient will perform lower body dressing with assist, with/without cues in positioning using equipment (OT) Flowsheets (Taken 07/14/2022 1259) LTG: Pt will perform lower body dressing with assistance level of: Independent with assistive device   Problem: RH Toileting Goal: LTG Patient will perform toileting task (3/3 steps) with assistance level (OT) Description: LTG: Patient will perform toileting task (3/3 steps) with assistance level (OT)  Flowsheets (Taken 07/14/2022 1259) LTG: Pt will perform toileting task (3/3 steps) with assistance level: Supervision/Verbal cueing   Problem: RH Functional Use of Upper Extremity Goal: LTG Patient will use RT/LT upper extremity as a (OT) Description: LTG: Patient will use right/left upper extremity as a stabilizer/gross assist/diminished/nondominant/dominant level with assist, with/without cues during functional activity (OT) Flowsheets (Taken 07/14/2022 1259) LTG: Use of upper extremity in functional activities: LUE as nondominant level   Problem: RH Toilet Transfers Goal: LTG Patient will perform toilet transfers w/assist (OT) Description: LTG: Patient will perform toilet transfers with assist, with/without cues using equipment (OT) Flowsheets (Taken 07/14/2022 1259) LTG: Pt will perform toilet transfers with assistance level of: Supervision/Verbal cueing   Problem: RH Tub/Shower Transfers Goal: LTG Patient will perform tub/shower transfers w/assist (OT) Description: LTG: Patient will perform tub/shower transfers with assist,  with/without cues using equipment (OT) Flowsheets (Taken 07/14/2022 1259) LTG: Pt will perform tub/shower stall transfers with assistance level of: Supervision/Verbal cueing

## 2022-07-14 NOTE — Evaluation (Addendum)
Occupational Therapy Assessment and Plan  Patient Details  Name: Vincent [name] MRN: [id number] Date of Birth: 1945-10-26  OT Diagnosis: apraxia, hemiplegia affecting non-dominant side, and muscle weakness (generalized) Rehab Potential: Rehab Potential (ACUTE ONLY): Good ELOS: 10-12 days   Today's Date: 07/14/2022 OT Individual Time: 9030-0923 OT Individual Time Calculation (min): 70 min     Hospital Problem: Principal Problem:   Cerebrovascular accident (CVA) of right basal ganglia ([place])   Past Medical History:  Past Medical History:  Diagnosis Date   Asthma    Depression    Diabetes mellitus    Hypertension    Numbness    [facility]roke ([facility])    Suicide and self-inflicted injury ([place]) Aug, 2012   cut wrist   Past Surgical History:  Past Surgical History:  Procedure Laterality Date   APPENDECTOMY     SHOULDER ARTHROSCOPY     TONSILLECTOMY      Assessment & Plan Clinical Impression: Patient is a 76 y.o. year old male with recent admission to the hospital on  07/09/22 with left leg weakness. 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 NIHSS=4; CIWA protocol; doppler LLE negative for DVT. PMH significant of HTN, DM2, shoulder arthroscopy. Patient transferred to CIR on 07/13/2022 .    Patient currently requires min with basic self-care skills secondary to muscle weakness, decreased cardiorespiratoy endurance, impaired timing and sequencing, unbalanced muscle activation, ataxia, decreased coordination, and decreased motor planning, decreased midline orientation and decreased attention to left, and decreased sitting balance, decreased standing balance, decreased postural control, hemiplegia, and decreased balance strategies.  Prior to hospitalization, patient could complete BADL with independent .  Patient will benefit from skilled intervention to increase independence with basic self-care skills prior to discharge home with care partner.  Anticipate patient  will require 24 hour supervision and  HHOT vs. OPOT .  OT - End of Session Endurance Deficit: Yes Endurance Deficit Description: Rest breaks within BADL tasks OT Assessment Rehab Potential (ACUTE ONLY): Good OT Barriers to Discharge: Decreased caregiver support OT Barriers to Discharge Comments: Patient lives alone, but states he has family that lives close OT Patient demonstrates impairments in the following area(s): Balance;Behavior;Edema;Endurance;Cognition;Motor;Nutrition;Pain;Perception;Safety;Sensory OT Basic ADL's Functional Problem(s): Eating;Grooming;Bathing;Dressing;Toileting OT Transfers Functional Problem(s): Toilet;Tub/Shower OT Additional Impairment(s): Fuctional Use of Upper Extremity OT Plan OT Intensity: Minimum of 1-2 x/day, 45 to 90 minutes OT Frequency: 5 out of 7 days OT Duration/Estimated Length of Stay: 10-12 days OT Treatment/Interventions: Balance/vestibular training;Disease mangement/prevention;Cognitive remediation/compensation;Community reintegration;Discharge planning;DME/adaptive equipment instruction;Functional electrical stimulation;Functional mobility training;Neuromuscular re-education;Patient/family education;Psychosocial support;Self Care/advanced ADL retraining;Pain management;Therapeutic Activities;Skin care/wound managment;Splinting/orthotics;UE/LE Coordination activities;Therapeutic Exercise;UE/LE Strength taining/ROM;Visual/perceptual remediation/compensation;Wheelchair propulsion/positioning OT Self Feeding Anticipated Outcome(s): Mod I OT Basic Self-Care Anticipated Outcome(s): Mod I/supervision OT Toileting Anticipated Outcome(s): Supervision OT Bathroom Transfers Anticipated Outcome(s): Supervision OT Recommendation Recommendations for Other Services: Neuropsych consult Patient destination: Home Follow Up Recommendations: Outpatient OT;Home health OT (OPOT vs HHOT) Equipment Recommended: To be determined   OT  Evaluation Precautions/Restrictions  Precautions Precautions: Fall Precaution Comments: Impulsive Restrictions Weight Bearing Restrictions: No Pain  Denies pain Home Living/Prior Functioning Home Living Living Arrangements: Alone Available Help at Discharge: Family, Available PRN/intermittently Type of Home: Apartment Home Access: Level entry Home Layout: One level Bathroom Shower/Tub: Multimedia programmer: Standard  Lives With: Alone IADL History Current License: Yes Type of Occupation: Used to work on [place] and Hobbies: Golf in the past, Pharmacist, hospital with computers, used to like to Secretary/administrator Prior Function Level of Independence: Independent with basic ADLs, Independent  with homemaking with ambulation Driving: Yes Vision Baseline Vision/History: 0 No visual deficits Ability to See in Adequate Light: 0 Adequate Patient Visual Report: No change from baseline Perception  Perception: Impaired Inattention/Neglect: Does not attend to left visual field Praxis Praxis: Impaired Praxis Impairment Details: Motor planning Cognition Cognition Overall Cognitive Status: Impaired/Different from baseline Arousal/Alertness: Awake/alert Orientation Level: Place;Situation;Person Person: Oriented Place: Oriented Situation: Oriented Memory: Appears intact Awareness: Impaired Awareness Impairment: Anticipatory impairment Problem Solving: Appears intact Safety/Judgment: Impaired Brief Interview for Mental Status (BIMS) Repetition of Three Words (First Attempt): 3 Temporal Orientation: Year: Correct Temporal Orientation: Month: Accurate within 5 days Temporal Orientation: Day: Incorrect Recall: "Sock": Yes, no cue required Recall: "Blue": Yes, no cue required Recall: "Bed": Yes, after cueing ("a piece of furniture") BIMS Summary Score: 13 Sensation Sensation Light Touch: Impaired Detail Light Touch Impaired Details: Impaired LUE Additional Comments: L UE  numbness at baseline since neck surgery 9 years Coordination Gross Motor Movements are Fluid and Coordinated: No Fine Motor Movements are Fluid and Coordinated: No Coordination and Movement Description: Decreased smoothness and accuracy, overshooting Finger Nose Finger Test: Dysmetria 9 Hole Peg Test: R:43, L: 78 Motor  Motor Motor: Hemiplegia  Balance Balance Static Sitting Balance Static Sitting - Balance Support: Feet supported Static Sitting - Level of Assistance: 5: Stand by assistance Dynamic Sitting Balance Dynamic Sitting - Balance Support: Feet supported Dynamic Sitting - Level of Assistance: 4: Min assist (CGA) Static Standing Balance Static Standing - Balance Support: During functional activity Static Standing - Level of Assistance: 4: Min assist Dynamic Standing Balance Dynamic Standing - Balance Support: During functional activity Dynamic Standing - Level of Assistance: 3: Mod assist Extremity/Trunk Assessment RUE Assessment RUE Assessment: Within Functional Limits LUE Assessment LUE Assessment: Exceptions to [facility] General Strength Comments: has strength and ROM deficits from neck surgery 9 years ago, L UE FF 120, gross strength 3/;5  Care Tool Care Tool Self Care Eating   Eating Assist Level: Set up assist    Oral Care    Oral Care Assist Level: Set up assist    Bathing Bathing activity did not occur: Refused            Upper Body Dressing(including orthotics)   What is the patient wearing?: Pull over shirt   Assist Level: Minimal Assistance - Patient > 75%    Lower Body Dressing (excluding footwear)   What is the patient wearing?: Underwear/pull up;Pants Assist for lower body dressing: Minimal Assistance - Patient > 75%    Putting on/Taking off footwear   What is the patient wearing?: Non-skid slipper socks Assist for footwear: Minimal Assistance - Patient > 75%       Care Tool Toileting Toileting activity   Assist for toileting: Minimal  Assistance - Patient > 75%     Care Tool Bed Mobility Roll left and right activity        Sit to lying activity        Lying to sitting on side of bed activity   Lying to sitting on side of bed assist level: the ability to move from lying on the back to sitting on the side of the bed with no back support.: Supervision/Verbal cueing     Care Tool Transfers Sit to stand transfer   Sit to stand assist level: Minimal Assistance - Patient > 75%    Chair/bed transfer   Chair/bed transfer assist level: Minimal Assistance - Patient > 75%     Toilet transfer   Assist Level:  Moderate Assistance - Patient 50 - 74%     Care Tool Cognition  Expression of Ideas and Wants Expression of Ideas and Wants: 4. Without difficulty (complex and basic) - expresses complex messages without difficulty and with speech that is clear and easy to understand  Understanding Verbal and Non-Verbal Content Understanding Verbal and Non-Verbal Content: 4. Understands (complex and basic) - clear comprehension without cues or repetitions   Memory/Recall Ability Memory/Recall Ability : Current season;Location of own room;Staff names and faces   Refer to Care Plan for Long Term Goals  SHORT TERM GOAL WEEK 1 OT Short Term Goal 1 (Week 1): Patient will maintain standing balance within grooming task with CGA OT Short Term Goal 2 (Week 1): Patient will complete toilet transfer with Min A OT Short Term Goal 3 (Week 1): Patient will recall hemi dressing techniques with min questioning cues.  Recommendations for other services: Neuropsych   Skilled Therapeutic Intervention OT eval completed addressing rehab process, OT purpose, POC, ELOS, and goals.  Pt ambulated without AD and mod A , but min A w/ RW. Overall Min a for BADL tasks. Pt with L hemiplegia, L inattention, and coordination deficits. See functional navigator below for further details on BADL performance.   ADL ADL Eating: Set up Grooming: Setup Upper Body  Bathing: Not assessed Lower Body Bathing: Not assessed Upper Body Dressing: Minimal assistance Lower Body Dressing: Minimal assistance Toileting: Minimal assistance Toilet Transfer: Moderate assistance Mobility  Bed Mobility Bed Mobility: Supine to Sit;Sit to Supine Supine to Sit: Supervision/Verbal cueing Sit to Supine: Supervision/Verbal cueing Transfers Sit to Stand: Minimal Assistance - Patient > 75% Stand to Sit: Minimal Assistance - Patient > 75%   Discharge Criteria: Patient will be discharged from OT if patient refuses treatment 3 consecutive times without medical reason, if treatment goals not met, if there is a change in medical status, if patient makes no progress towards goals or if patient is discharged from hospital.  The above assessment, treatment plan, treatment alternatives and goals were discussed and mutually agreed upon: by patient  Valma Cava 07/14/2022, 3:56 PM

## 2022-07-14 NOTE — Progress Notes (Signed)
PMR Admission Coordinator Pre-Admission Assessment   Patient: Vincent [name] is an 76 y.o., male MRN: [id number] DOB: 1946/06/30 Height: 5' 8"  (172.7 cm) Weight: 78.3 kg   Insurance Information HMO:     PPO:      PCP:      IPA:      80/20:      OTHER:  PRIMARY: [place]      Policy#: [id number]      Subscriber: patient CM Name: Sherlynn Carbon      Phone#: [phone]     Fax#: [phone] Pre-Cert#: [id number] approval for 30 days 07/13/22 to 08/12/22      Employer:  Eff. Date:  09/28/2011    Deduct:       Out of Pocket Max:       Life Max:  CIR:  per [facility] guidelines     SNF:  Outpatient:      Co-Pay:  Home Health:       Co-Pay:  DME:      Co-Pay:  Providers:   SECONDARY:  Medicare part A     Policy#: [id number]     Phone#:        The "Data Collection Information Summary" for patients in Inpatient Rehabilitation Facilities with attached "Privacy Act [place] Records" was provided and verbally reviewed with: Patient and Family   Emergency Contact Information Contact Information       Name Relation Home Work Mobile    Counsell,Connie Other [phone]        Page,Bobby Brother [phone]               Current Medical History  Patient Admitting Diagnosis: R CVA  History of Present Illness: Vincent [name] is a 76 year old right-handed male with history of depression as well as 2012, diabetes mellitus, stable left upper lobe pulmonary nodules, papillary cancer, hypertension, asthma with tobacco/alcohol use.  Per chart review patient lives alone.  Independent prior to admission.  1 level apartment.  Presented 07/09/2022 with acute onset of left-sided weakness.  Cranial CT scan negative.  CT no emergent large vessel occlusion.  2 left upper lobe pulmonary nodules measuring 6 mm. Recommendations noncontrast CT 3 to 6 months.  Patient did not receive tPA.  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  Admission chemistries unremarkable  except sodium 130 glucose 203, alcohol negative, urine drug screen negative, WBC 12,700, hemoglobin A1c 6.8.  Echo with ejection fraction of 60 to 65% no wall motion abnormalities.Therapies are recommending intensive rehab. Complete NIHSS TOTAL: 2   Patient's medical record from Vincent [name] has been reviewed by the rehabilitation admission coordinator and physician.   Past Medical History      Past Medical History:  Diagnosis Date   Asthma     Depression     Diabetes mellitus     Hypertension     Numbness     Stroke [facility])     Suicide and self-inflicted injury ([place]) Aug, 2012    cut wrist      Has the patient had major surgery during 100 days prior to admission? No   Family History   family history is not on file.   Current Medications   Current Facility-Administered Medications:    acetaminophen (TYLENOL) tablet 650 mg, 650 mg, Oral, Q4H PRN **OR** [DISCONTINUED] acetaminophen (TYLENOL) 160 MG/5ML solution 650 mg, 650 mg, Per Tube, Q4H PRN **OR** [DISCONTINUED] acetaminophen (TYLENOL) suppository 650 mg, 650 mg, Rectal, Q4H PRN, Marylyn Ishihara, Tyrone A,  DO   albuterol (PROVENTIL) (2.5 MG/3ML) 0.083% nebulizer solution 3 mL, 3 mL, Inhalation, Q6H PRN, Marylyn Ishihara, Tyrone A, DO   aspirin EC tablet 81 mg, 81 mg, Oral, Daily, Bonnielee Haff, MD, 81 mg at 07/12/22 0819   atorvastatin (LIPITOR) tablet 40 mg, 40 mg, Oral, Daily, Rosalin Hawking, MD, 40 mg at 07/12/22 9628   [COMPLETED] clopidogrel (PLAVIX) tablet 300 mg, 300 mg, Oral, Once, 300 mg at 07/09/22 2119 **FOLLOWED BY** clopidogrel (PLAVIX) tablet 75 mg, 75 mg, Oral, Daily, Bhagat, Srishti L, MD, 75 mg at 07/12/22 0819   enoxaparin (LOVENOX) injection 40 mg, 40 mg, Subcutaneous, Q24H, Bonnielee Haff, MD, 40 mg at 36/62/94 7654   folic acid (FOLVITE) tablet 1 mg, 1 mg, Oral, Daily, Kyle, Tyrone A, DO, 1 mg at 07/12/22 6503   levothyroxine (SYNTHROID) tablet 125 mcg, 125 mcg, Oral, Q0600, Marylyn Ishihara, Tyrone A, DO, 125 mcg at 07/12/22 0600   LORazepam  (ATIVAN) tablet 1-4 mg, 1-4 mg, Oral, Q1H PRN **OR** LORazepam (ATIVAN) injection 1-4 mg, 1-4 mg, Intravenous, Q1H PRN, Marylyn Ishihara, Tyrone A, DO   multivitamin with minerals tablet 1 tablet, 1 tablet, Oral, Daily, Kyle, Tyrone A, DO, 1 tablet at 07/12/22 5465   nicotine (NICODERM CQ - dosed in mg/24 hours) patch 21 mg, 21 mg, Transdermal, Daily, Kyle, Tyrone A, DO, 21 mg at 07/12/22 0820   senna-docusate (Senokot-S) tablet 1 tablet, 1 tablet, Oral, QHS PRN, Marylyn Ishihara, Tyrone A, DO   sodium chloride tablet 1 g, 1 g, Oral, BID WC, Bonnielee Haff, MD   thiamine (VITAMIN B1) tablet 100 mg, 100 mg, Oral, Daily, 100 mg at 07/12/22 0819 **OR** thiamine (VITAMIN B1) injection 100 mg, 100 mg, Intravenous, Daily, Kyle, Tyrone A, DO   Patients Current Diet:  Diet Order                  Diet Carb Modified Fluid consistency: Thin; Room service appropriate? Yes; Fluid restriction: 1500 mL Fluid  Diet effective now                         Precautions / Restrictions Precautions Precautions: Fall Precaution Comments: ataxic Restrictions Weight Bearing Restrictions: No    Has the patient had 2 or more falls or a fall with injury in the past year? No   Prior Activity Level Community (5-7x/wk): independent   Prior Functional Level Self Care: Did the patient need help bathing, dressing, using the toilet or eating? Independent   Indoor Mobility: Did the patient need assistance with walking from room to room (with or without device)? Independent   Stairs: Did the patient need assistance with internal or external stairs (with or without device)? Independent   Functional Cognition: Did the patient need help planning regular tasks such as shopping or remembering to take medications? Independent   Patient Information Are you of Hispanic, Latino/a,or Spanish origin?: A. No, not of Hispanic, Latino/a, or Spanish origin What is your race?: A. White Do you need or want an interpreter to communicate with a doctor  or health care staff?: 0. No   Patient's Response To:  Health Literacy and Transportation Is the patient able to respond to health literacy and transportation needs?: Yes Health Literacy - How often do you need to have someone help you when you read instructions, pamphlets, or other written material from your doctor or pharmacy?: Rarely In the past 12 months, has lack of transportation kept you from medical appointments or from getting medications?: No In  the past 12 months, has lack of transportation kept you from meetings, work, or from getting things needed for daily living?: No   Development worker, international aid / [place] Devices/Equipment: None Home Equipment: None   Prior Device Use: Indicate devices/aids used by the patient prior to current illness, exacerbation or injury? None of the above   Current Functional Level Cognition   Arousal/Alertness: Awake/alert Overall Cognitive Status: Within Functional Limits for tasks assessed Orientation Level: Oriented X4 Safety/Judgement: Decreased awareness of deficits General Comments: emerging safety awareness, however requires cues when navigating (turning with walker in the hallway) Memory: Appears intact Awareness: Impaired Awareness Impairment: Anticipatory impairment Problem Solving: Appears intact Safety/Judgment: Other (comment) Comments: Patient does not appear patient with staying too long "I'll give them a week" and this seems to result in him perhaps downplaying his deficits even to himself    Extremity Assessment (includes Sensation/Coordination)   Upper Extremity Assessment: LUE deficits/detail LUE Deficits / Details: ataxic. Undershooting/overshooting. Difficulty with coordination, Dysmetria, dysdiadokinesia slowed as compared to R. LUE drift with bil shoulder flexion. Poor coordination and dexterity to don socks.  Lower Extremity Assessment: Defer to PT evaluation LLE Deficits / Details: hip 4/5, knee 3/5, ankle  DF 2+ LLE Sensation: WNL LLE Coordination: decreased gross motor, decreased fine motor     ADLs   Overall ADL's : Needs assistance/impaired Eating/Feeding: Minimal assistance Eating/Feeding Details (indicate cue type and reason): min A for bil tasks (cutting meat, etc) Grooming: Oral care, Set up, Sitting Grooming Details (indicate cue type and reason): Pt using problem solving to overcome LUE incoordination during oral care. Upper Body Bathing: Set up, Sitting Lower Body Bathing: Minimal assistance, Sit to/from stand Upper Body Dressing : Minimal assistance Lower Body Dressing: Minimal assistance, Sitting/lateral leans Lower Body Dressing Details (indicate cue type and reason): Min A. Pt doffing L sock with significantly increased time, but unable to don due to poor coordination and dexterity as well as inability to perform figure 4 or adequately reach down to feet. Pt donning and doffing R sock with significantly incresaed time. Able to perform modified figure 4 Toilet Transfer: Minimal assistance, Moderate assistance, Stand-pivot, BSC/3in1, Rolling walker (2 wheels) Toilet Transfer Details (indicate cue type and reason): Max multimodal cues to coordinate RW and BLE. Toileting- Clothing Manipulation and Hygiene: Moderate assistance, Sit to/from stand Functional mobility during ADLs: Minimal assistance, Moderate assistance, Rolling walker (2 wheels) General ADL Comments: Min-mod A for functional mobility. Up to max cues needed with fatigue     Mobility   Overal bed mobility: Needs Assistance Bed Mobility: Sit to Supine Supine to sit: Min guard Sit to supine: Min guard General bed mobility comments: up in chair upon OT entry     Transfers   Overall transfer level: Needs assistance Equipment used: Rolling walker (2 wheels) Transfers: Sit to/from Stand Sit to Stand: Min guard Bed to/from chair/wheelchair/BSC transfer type:: Step pivot Step pivot transfers: Mod assist General transfer  comment: min gaurd to rise, up to mod A when completing functional ambulation due to L knee buckling     Ambulation / Gait / Stairs / Wheelchair Mobility   Ambulation/Gait Ambulation/Gait assistance: Min assist, Mod assist Gait Distance (Feet): 45 Feet Assistive device: Rolling walker (2 wheels) Gait Pattern/deviations: Step-to pattern, Decreased step length - right, Decreased stance time - left, Decreased dorsiflexion - left, Knee hyperextension - left General Gait Details: L leg snapping into hyperextension with each step, pt able to correct with cues but unable to maintain without direct  cues. cues for RW proximity, reduced DF of LLE with fatigue Gait velocity: decreased Gait velocity interpretation: <1.31 ft/sec, indicative of household ambulator     Posture / Balance Dynamic Sitting Balance Sitting balance - Comments: Min guard A when reaching toward feet, however min A when attempting to perform figure 4 as trunk observed to lean backward Balance Overall balance assessment: Needs assistance Sitting-balance support: No upper extremity supported, Feet supported Sitting balance-Leahy Scale: Fair Sitting balance - Comments: Min guard A when reaching toward feet, however min A when attempting to perform figure 4 as trunk observed to lean backward Standing balance support: Bilateral upper extremity supported, Reliant on assistive device for balance Standing balance-Leahy Scale: Poor Standing balance comment: reliant on RW for support     Special needs/care consideration Skin intact    Previous Home Environment (from acute therapy documentation) Living Arrangements: Alone  Lives With: Alone Available Help at Discharge: Family, Available PRN/intermittently Type of Home: Apartment Home Layout: One level Home Access: Level entry Bathroom Shower/Tub: Multimedia programmer: [place]: No   Discharge Living Setting Plans for Discharge Living Setting: Patient's  home, Alone Type of Home at Discharge: Apartment Discharge Home Layout: One level Discharge Home Access: Level entry Discharge Bathroom Shower/Tub: Walk-in shower Discharge Bathroom Toilet: Standard Does the patient have any problems obtaining your medications?: No   Social/Family/Support Systems Anticipated Caregiver: Brother, Mortimer Fries prn Anticipated Caregiver's Contact Information: [phone] Caregiver Availability: Intermittent Discharge Plan Discussed with Primary Caregiver: Yes Is Caregiver In Agreement with Plan?: Yes Does Caregiver/Family have Issues with Lodging/Transportation while Pt is in Rehab?: No   Goals Patient/Family Goal for Rehab: mod I PT, OT Expected length of stay: 7-10 days Pt/Family Agrees to Admission and willing to participate: Yes Program Orientation Provided & Reviewed with Pt/Caregiver Including Roles  & Responsibilities: Yes  Barriers to Discharge: Insurance for SNF coverage   Decrease burden of Care through IP rehab admission: Othern/a   Possible need for SNF placement upon discharge: not anticipated   Patient Condition: I have reviewed medical records from [facility], spoken with  [facility] , and patient and family member. I met with patient at the bedside and discussed via phone for inpatient rehabilitation assessment.  Patient will benefit from ongoing PT and OT, can actively participate in 3 hours of therapy a day 5 days of the week, and can make measurable gains during the admission.  Patient will also benefit from the coordinated team approach during an Inpatient Acute Rehabilitation admission.  The patient will receive intensive therapy as well as Rehabilitation physician, nursing, social worker, and care management interventions.  Due to safety, skin/wound care, disease management, medication administration, and patient education the patient requires 24 hour a day rehabilitation nursing.  The patient is currently minA with mobility and basic ADLs.  Discharge  setting and therapy post discharge at home with home health is anticipated.  Patient has agreed to participate in the Acute Inpatient Rehabilitation Program and will admit 07/13/22.   Preadmission Screen Completed By:  Nelly Laurence, 07/12/2022 1:21 PM ______________________________________________________________________   Discussed status with Dr. Naaman Plummer on 07/13/22 at 9:30am and received approval for admission today.   Admission Coordinator:  Nelly Laurence, time 9:48am/Date 07/13/22    Assessment/Plan: Diagnosis: right corona radiata/basal ganglia infarct Does the need for close, 24 hr/day Medical supervision in concert with the patient's rehab needs make it unreasonable for this patient to be served in a less intensive setting? Yes Co-Morbidities requiring supervision/potential complications:  previous cervical myelopathy/decompression, depression, dm, htn Due to bladder management, bowel management, safety, skin/wound care, disease management, medication administration, pain management, and patient education, does the patient require 24 hr/day rehab nursing? Yes Does the patient require coordinated care of a physician, rehab nurse, PT, OT, and to address physical and functional deficits in the context of the above medical diagnosis(es)? Yes Addressing deficits in the following areas: balance, endurance, locomotion, strength, transferring, bowel/bladder control, bathing, dressing, feeding, grooming, toileting, and psychosocial support Can the patient actively participate in an intensive therapy program of at least 3 hrs of therapy 5 days a week? Yes The potential for patient to make measurable gains while on inpatient rehab is excellent Anticipated functional outcomes upon discharge from inpatient rehab: modified independent PT, modified independent OT, n/a SLP Estimated rehab length of stay to reach the above functional goals is: 7-10 days Anticipated discharge destination: Home 10.  Overall Rehab/Functional Prognosis: excellent     MD Signature: Meredith Staggers, MD, [place] Director Rehabilitation Services 07/13/2022          Revision History                                          Note Details  Author Meredith Staggers, MD File Time 07/13/2022 12:44 PM  Author Type Physician Status Signed  Last Editor Meredith Staggers, MD Service Physical Medicine and [place] # [id number] Admit Date 07/13/2022

## 2022-07-14 NOTE — Progress Notes (Signed)
PROGRESS NOTE   Subjective/Complaints:  Pt seen at bedside with his brother. States he is doing well overall, no complaints. Is concerned regarding a surgical appointment on Monday to have a nose mass removed/biopsied with the [place]. States he has been waiting for this appointment for 6 months and cannot wait another few months for reschedule.   ROS: Denies fevers, chills, N/V, abdominal pain, constipation, diarrhea, SOB, cough, chest pain, new weakness or paraesthesias.    Objective:   No results found. Recent Labs    07/14/22 0825  WBC 10.0  HGB 14.9  HCT 41.6  PLT 209   Recent Labs    07/12/22 0435 07/14/22 0825  NA 134* 132*  K 3.9 3.8  CL 97* 96*  CO2 25 28  GLUCOSE 164* 233*  BUN 18 19  CREATININE 0.81 0.84  CALCIUM 8.8* 8.7*    Intake/Output Summary (Last 24 hours) at 07/14/2022 1109 Last data filed at 07/14/2022 0700 Gross per 24 hour  Intake 476 ml  Output 1375 ml  Net -899 ml        Physical Exam: Vital Signs Blood pressure (!) 153/71, pulse 69, temperature 97.7 F (36.5 C), temperature source Oral, resp. rate 16, height 5\' 8"  (1.727 m), weight 72.8 kg, SpO2 96 %.   Physical Exam Constitutional:      Appearance: Normal appearance.  HENT:     Head: Normocephalic and atraumatic.     Nose: Nose normal.     Mouth/Throat:     Pharynx: Oropharynx is clear.  Eyes:     General: No scleral icterus.    Extraocular Movements: Extraocular movements intact.     Pupils: Pupils are equal, round, and reactive to light.  Cardiovascular:     Rate and Rhythm: Normal rate and regular rhythm.     Heart sounds: No murmur heard.    No gallop.  Pulmonary:     Effort: Pulmonary effort is normal. No respiratory distress.     Breath sounds: Normal breath sounds. No wheezing.  Abdominal:     General: There is no distension.     Palpations: Abdomen is soft. There is no mass.  Musculoskeletal:        General:  Trace BL pretibial edema.     Cervical back: Normal range of motion.  Skin:    Comments: A few scattered bruises and abrasions. Chronic vascular changes in the distal LE's - stable  Neurological:     Mental Status: He is alert.     Comments: AAOx3. Normal language. No dysarthria   CN 2-12 exam unremarkable.   LUE 4-/5 deltoid 4+ biceps, triceps, 4 to 4+ with wrist flexion and grip.  LLE 4/5 prox to distal.  RUE and RLE 5/5 prox to distal.  Sensation to light touch intact.     Psychiatric:        Mood and Affect: Mood normal.        Behavior: Behavior normal.     Assessment/Plan: 1. Functional deficits which require 3+ hours per day of interdisciplinary therapy in a comprehensive inpatient rehab setting. Physiatrist is providing close team supervision and 24 hour management of active medical problems listed below. Physiatrist  and rehab team continue to assess barriers to discharge/monitor patient progress toward functional and medical goals  Care Tool:  Bathing              Bathing assist       Upper Body Dressing/Undressing Upper body dressing        Upper body assist      Lower Body Dressing/Undressing Lower body dressing            Lower body assist       Toileting Toileting    Toileting assist       Transfers Chair/bed transfer  Transfers assist           Locomotion Ambulation   Ambulation assist              Walk 10 feet activity   Assist           Walk 50 feet activity   Assist           Walk 150 feet activity   Assist           Walk 10 feet on uneven surface  activity   Assist           Wheelchair     Assist               Wheelchair 50 feet with 2 turns activity    Assist            Wheelchair 150 feet activity     Assist          Blood pressure (!) 153/71, pulse 69, temperature 97.7 F (36.5 C), temperature source Oral, resp. rate 16, height 5\' 8"  (1.727 m),  weight 72.8 kg, SpO2 96 %.    Medical Problem List and Plan: 1. 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 Pt with new left sided weakness although he had some prior LUE weakness from a cervical spondylosis/myelopathy (hx of ACDF)             -patient may shower             -ELOS/Goals: 7-10 days, mod I goals with PT and OT. Pt has some nearby family and friends who can check in on him. 1 level home with level entry. 2.  Antithrombotics: -DVT/anticoagulation:  Pharmaceutical: Lovenox             -antiplatelet therapy: Aspirin and Plavix x3 weeks then Plavix alone 3. Pain Management: Tylenol as needed 4. Mood/Behavior/Sleep: Provide emotional support             -antipsychotic agents: N/A 5. Neuropsych/cognition: This patient is capable of making decisions on his own behalf. 6. Skin/Wound Care: Routine skin check              - [facility]; SW looking into rescheduling  7. Fluids/Electrolytes/Nutrition: Routine in and outs with follow-up chemistries 8.  Permissive hypertension.  Patient on Norvasc 10 mg daily as well as Zestoretic 20-12.5 mg daily prior to admission.  Resume as needed             -bp presently controlled    07/14/2022    5:28 AM 07/13/2022    7:31 PM 07/13/2022    2:02 PM  Vitals with BMI  Height   5\' 8"   Weight   160 lbs 8 oz  BMI   24.41  Systolic 153 135   Diastolic 71 66  Pulse 69 63     9.  Tobacco/alcohol use.  NicoDerm patch.  Provide counseling 10.  Hypothyroidism.  Synthroid. Labs WNL 10/16.  11.  Diabetes mellitus.  Hemoglobin A1c 6.8.  Resume Glucophage 500 mg twice daily             -monitor sugars AC and HS Recent Labs    07/13/22 1635 07/14/22 0608  GLUCAP 140* 152*     12.  Hyperlipidemia.  Lipitor 13.  Hyponatremia.  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  Continue sodium chloride tabs for now.  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  History of pulmonary nodules.  Follow-up CT 3 to 6 months.    LOS: 1 days A FACE TO FACE EVALUATION WAS PERFORMED  Angelina Sheriff 07/14/2022, 11:09 AM

## 2022-07-14 NOTE — Progress Notes (Signed)
Inpatient Rehabilitation  Patient information reviewed and entered into eRehab system by Burlon Centrella M. Mong Neal, M.A., CCC/SLP, PPS Coordinator.  Information including medical coding, functional ability and quality indicators will be reviewed and updated through discharge.    

## 2022-07-14 NOTE — Progress Notes (Incomplete)
Inpatient Rehabilitation Care Coordinator Assessment and Plan Patient Details  Name: Vincent [name] MRN: [id number] Date of Birth: 30-Oct-1945  Today's Date: 07/14/2022  Hospital Problems: Principal Problem:   Cerebrovascular accident (CVA) of right basal ganglia [facility])  Past Medical History:  Past Medical History:  Diagnosis Date   Asthma    Depression    Diabetes mellitus    Hypertension    Numbness    Stroke [facility])    Suicide and self-inflicted injury ([place]) Aug, 2012   cut wrist   Past Surgical History:  Past Surgical History:  Procedure Laterality Date   APPENDECTOMY     SHOULDER ARTHROSCOPY     TONSILLECTOMY     Social History:  reports that he has been smoking cigarettes. He has a 50.00 pack-year smoking history. He has never used smokeless tobacco. He reports current alcohol use of about 8.0 standard drinks of alcohol per week. He reports that he does not use drugs.  Family / Support Systems Marital Status: Divorced How Long?: 2006 Patient Roles: Spouse, Parent Spouse/Significant Other: Divorced Children: 4 adult children. They live in [place], [place], [place], and [place]. Relationship appears to be strained as they will not helping. Other Supports: Brother-PRN Anticipated Caregiver: Brother-PRN Ability/Limitations of Caregiver: Pt brother is able tohelp PRN. Caregiver Availability: Other (Comment) (PRN) Family Dynamics: pt lives alone in an apartment.  Social History Preferred language: English Religion:  Cultural Background: PT was a Health and safety inspector, and worked in Information systems manager job roles. Reports his last job was at [employer] until the store closed in 2009. 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 No issues since this occurrence. Guardian/Conservator: N/A   Abuse/Neglect Abuse/Neglect Assessment Can Be Completed: Yes Physical Abuse: Denies Verbal Abuse: Denies Sexual Abuse: Denies Exploitation of patient/patient's resources: Denies Self-Neglect: Denies  Patient response to: Social Isolation - How often do you feel lonely or isolated from those around you?: Never  Emotional Status Pt's affect, behavior and adjustment status: Pt in good spirits at time of visit. Recent Psychosocial Issues: Denies Psychiatric History: Denies Substance Abuse History: Pt admits he been smoking 1pk of cigarettes a day for 61 yrs. Admits to two beers daily. Denies rec drug use.  Patient / Family Perceptions, Expectations & Goals Pt/Family understanding of illness & functional limitations: Pt and brother have a general understanding of pt care needs Premorbid pt/family roles/activities: Independent Anticipated changes in roles/activities/participation: Assistance with ADLs/IADLs Pt/family expectations/goals: Pt goal is to work on getting out of here. Pt brother Vincent [name] states for pt to get his strength back.  Community Resources [employer] [facility]s: None Premorbid Home Care/DME Agencies: None Transportation available at discharge: TBD Is the patient able to respond to transportation needs?: Yes In the past 12 months, has lack of transportation kept you from medical appointments or from getting medications?: No In the past 12 months, has lack of transportation kept you from meetings, work, or from getting things needed for daily living?: No Resource referrals recommended: Neuropsychology  Discharge Planning Living Arrangements: Alone Support Systems: Other relatives Type of Residence: Private residence Insurance Resources: Multimedia programmer (specify) ([facility]) Financial Resources: Radio broadcast assistant Screen Referred: No Living Expenses:  Rent Money Management: Patient Does the patient have any problems obtaining your  medications?: No Home Management: Pt manages all homecare needs Patient/Family Preliminary Plans: TBD Care Coordinator Barriers to Discharge: Decreased caregiver support, Lack of/limited family support Care Coordinator Anticipated Follow Up Needs: HH/OP  Clinical Impression SW met with pt and pt brother Vincent [name] in room to introduce self, explain role, and discuss discharge process. 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 No HCPOA. No DME. He was adamant about getting procedure for a nodule on his nose removed due to it taking so long for this appointment SW shared will [facility] to see if there were any options. Otherwise, it was not likely the procedure would occur.   *SW informed pt [facility] SW Vincent [name] on above about procedure. States she will inform the clinic on him being in the hospital and pt will need to re-schedule this appointment as it is only a consultation. SW also requested an aide through [place] homecare program.   Vincent [name] 07/14/2022, 2:02 PM

## 2022-07-15 LAB — BASIC METABOLIC PANEL
Anion gap: 9 (ref 5–15)
BUN: 21 mg/dL (ref 8–23)
CO2: 25 mmol/L (ref 22–32)
Calcium: 8.9 mg/dL (ref 8.9–10.3)
Chloride: 100 mmol/L (ref 98–111)
Creatinine, Ser: 0.81 mg/dL (ref 0.61–1.24)
GFR, Estimated: >60 mL/min (ref >60)
Glucose, Bld: 147 mg/dL — ABNORMAL HIGH (ref 70–99)
Potassium: 4.1 mmol/L (ref 3.5–5.1)
Sodium: 134 mmol/L — ABNORMAL LOW (ref 135–145)

## 2022-07-15 MED ORDER — ORAL CARE MOUTH RINSE: 15.0000 mL | OROMUCOSAL | Status: DC | PRN

## 2022-07-15 MED ORDER — AMLODIPINE BESYLATE 5 MG PO TABS
5.0000 mg | ORAL_TABLET | Freq: Every day | ORAL | Status: DC
  Administered 2022-07-15 – 2022-07-23 (×9): 5 mg via ORAL
  Filled 2022-07-15 (×9): qty 1

## 2022-07-15 NOTE — Progress Notes (Signed)
Met with patient. Up in w/c. Oriented to rehab. Informed of team meetings every Tuesday.  Discussed education information with him. Goes to the [place] for medical visits. Discussed A!C reports that keeps it under 7. Tried to discuss diet with him and he reported that already cut out sugars. Discussed exercise and that can always make small changes that over time will amount to big changes.  Patient also has no interest in smoking cessation. Said [place] talks about diet, smoking and they have given up.  Informed that has educational handouts anyway.  All needs met, call bell in reach.

## 2022-07-15 NOTE — Care Management (Signed)
Inpatient [place] Individual Statement of Services  Patient Name:  Vincent [name]  Date:  07/15/2022  Welcome to the [facility].  Our goal is to provide you with an individualized program based on your diagnosis and situation, designed to meet your specific needs.  With this comprehensive rehabilitation program, you will be expected to participate in at least 3 hours of rehabilitation therapies Monday-Friday, with modified therapy programming on the weekends.  Your rehabilitation program will include the following services:  Physical Therapy (PT), Occupational Therapy (OT), 24 hour per day rehabilitation nursing, Therapeutic Recreaction (TR), Psychology, Neuropsychology, Care Coordinator, Rehabilitation Medicine, [place], and Other  Weekly team conferences will be held on Tuesdays to discuss your progress.  Your Inpatient Rehabilitation Care Coordinator will talk with you frequently to get your input and to update you on team discussions.  Team conferences with you and your family in attendance may also be held.  Expected length of stay: 10-12 days    Overall anticipated outcome: Independent with an Assistive Device  Depending on your progress and recovery, your program may change. Your Inpatient Rehabilitation Care Coordinator will coordinate services and will keep you informed of any changes. Your Inpatient Rehabilitation Care Coordinator's name and contact numbers are listed  below.  The following services may also be recommended but are not provided by the [facility] will be made to provide these services after discharge if needed.  Arrangements include referral to agencies that provide these services.  Your insurance has been verified to be:  Therapist, music primary doctor is:  [employer]  Pertinent information will be shared with your doctor and your insurance company.  Inpatient Rehabilitation Care Coordinator:  Cathleen Corti [phone] or ([phone]  Information discussed with and copy given to patient by: Rana Snare, 07/15/2022, 6:16 AM

## 2022-07-15 NOTE — Progress Notes (Signed)
Pt refused blood sugar check this morning. 

## 2022-07-15 NOTE — Progress Notes (Signed)
Occupational Therapy Session Note  Patient Details  Name: Vincent [name] MRN: [id number] Date of Birth: 10-25-1945  Today's Date: 07/15/2022 OT Individual Time: 0902-1000 OT Individual Time Calculation (min): 58 min    Short Term Goals: Week 1:  OT Short Term Goal 1 (Week 1): Patient will maintain standing balance within grooming task with CGA OT Short Term Goal 2 (Week 1): Patient will complete toilet transfer with Min A OT Short Term Goal 3 (Week 1): Patient will recall hemi dressing techniques with min questioning cues.  Skilled Therapeutic Interventions/Progress Updates:  Pt greeted seated in w/c, pt agreeable to OT intervention. Pt declined shower today as pt doesn't have clean clothes and declined blue paper scrubs. Pt reports difficulty donning L shoe, issued pt shoe horn to assist with task. Remainder of session focus on dynamic standing balance, transfer training, and  BLE strengthening/endurance.  Pt completed dynamic balance task where pt instructed to stand at mirror to remove squgz from mirror with pt instructed to reach out of BOS, pt completed task with unilateral support and CGA with no LOB. Graded task up and had pt stand on airex cushion to further challenge balance with pt able to complete task with unilateral support and no LOB.  Pt completed x5 sit>stands from EOM with emphasis on no UE support with pt needing MIN- MOD A for stands.  Pt completed standing cone taps with an emphasis on LLE coordination. Pt completed task in standing for one minutes with no LOB and BUE support.  Pt completed functional ambulation towards room with RW and CGA with pt noted to have dificulty with swing in LLE with pts L toe getting caught during follow through.  Pt transported back to room remainder of distance with pt left up in w/c with alarm belt activated and all needs within reach.   Therapy Documentation Precautions:  Precautions Precautions: Fall Precaution Comments:  Impulsive Restrictions Weight Bearing Restrictions: No   Pain: No pain    Therapy/Group: Individual Therapy  Precious Haws 07/15/2022, 12:21 PM

## 2022-07-15 NOTE — Discharge Summary (Signed)
Physician Discharge Summary  Patient ID: Vincent [name] MRN: [id number] DOB/AGE: 04/27/1946 76 y.o.  Admit date: 07/13/2022 Discharge date: 07/23/2022  Discharge Diagnoses:  Principal Problem:   Stroke of right basal ganglia ([place]) DVT prophylaxis Permissive hypertension Tobacco/alcohol use Hypothyroidism Diabetes mellitus Hyperlipidemia Hyponatremia History of pulmonary nodules Nose mass Skin sore to left second toe   Discharged Condition: Stable  Significant Diagnostic Studies: ECHOCARDIOGRAM COMPLETE  Result Date: 07/10/2022    ECHOCARDIOGRAM REPORT   Patient Name:   Vincent [name] Date of Exam: 07/10/2022 Medical Rec #:  [id number]    Height:       68.0 in Accession #:    [id number]   Weight:       172.6 lb Date of Birth:  October 03, 1945     BSA:          1.920 m Patient Age:    76 years     BP:           155/70 mmHg Patient Gender: M            HR:           82 bpm. Exam Location:  Inpatient Procedure: 2D Echo, Cardiac Doppler and Color Doppler Indications:    Stroke  History:        Patient has no prior history of Echocardiogram examinations.                 Risk Factors:Hypertension, Diabetes and Dyslipidemia.  Sonographer:    Clayton Lefort RDCS (AE) Referring Phys: Jonnie Finner  Sonographer Comments: Technically difficult study due to poor echo windows. IMPRESSIONS  1. Left ventricular ejection fraction, by estimation, is 60 to 65%. The left ventricle has normal function. The left ventricle has no regional wall motion abnormalities. There is moderate left ventricular hypertrophy. Left ventricular diastolic parameters were normal.  2. Right ventricular systolic function is normal. The right ventricular size is normal. Tricuspid regurgitation signal is inadequate for assessing PA pressure.  3. The mitral valve is normal in structure. Trivial mitral valve regurgitation. No evidence of mitral stenosis.  4. The aortic valve is grossly normal. Aortic valve regurgitation is not visualized. No aortic  stenosis is present.  5. The inferior vena cava is normal in size with greater than 50% respiratory variability, suggesting right atrial pressure of 3 mmHg.  6. Cannot exclude a small PFO. 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 Left ventricular ejection fraction, by estimation, is 60 to 65%. The left ventricle has normal function. The left ventricle has no regional wall motion abnormalities. The left ventricular internal cavity size was normal in size. There is moderate left ventricular hypertrophy. Left ventricular diastolic parameters were normal. Right Ventricle: The right ventricular size is normal. No increase in right ventricular wall thickness. Right ventricular systolic function is normal. Tricuspid regurgitation signal is inadequate for assessing PA pressure. Left Atrium: Left atrial size was normal in size. Right Atrium: Right atrial size was normal in size. Pericardium: There is no evidence of pericardial effusion. Presence of epicardial fat layer. Mitral Valve: Chordal SAM. The mitral valve is normal in structure. Trivial mitral valve regurgitation. No evidence of mitral valve stenosis. Tricuspid Valve: The tricuspid valve is normal in structure. Tricuspid valve regurgitation is trivial. No evidence of tricuspid stenosis. Aortic Valve: The aortic valve is grossly normal. Aortic valve regurgitation is not visualized. No aortic stenosis is present. Aortic valve mean gradient measures 5.0 mmHg. Aortic valve peak gradient measures 7.8 mmHg. Aortic  valve area, by VTI measures 3.36 cm. Pulmonic Valve: The pulmonic valve was not well visualized. Pulmonic valve regurgitation is not visualized. No evidence of pulmonic stenosis. Aorta: The aortic root is normal in size and structure. Venous: The inferior vena cava is normal in size with greater than 50% respiratory variability, suggesting right atrial pressure of 3 mmHg. IAS/Shunts: There is redundancy of the interatrial septum.  Cannot exclude a small PFO.  LEFT VENTRICLE PLAX 2D LVIDd:         4.40 cm   Diastology LVIDs:         2.70 cm   LV e' medial:    8.49 cm/s LV PW:         1.30 cm   LV E/e' medial:  8.7 LV IVS:        1.40 cm   LV e' lateral:   9.25 cm/s LVOT diam:     2.20 cm   LV E/e' lateral: 8.0 LV SV:         102 LV SV Index:   53 LVOT Area:     3.80 cm  RIGHT VENTRICLE RV Basal diam:  2.90 cm RV S prime:     16.20 cm/s TAPSE (M-mode): 2.6 cm LEFT ATRIUM             Index        RIGHT ATRIUM           Index LA diam:        3.40 cm 1.77 cm/m   RA Area:     15.10 cm LA Vol (A2C):   67.4 ml 35.11 ml/m  RA Volume:   37.50 ml  19.53 ml/m LA Vol (A4C):   18.2 ml 9.48 ml/m LA Biplane Vol: 38.7 ml 20.16 ml/m  AORTIC VALVE AV Area (Vmax):    4.02 cm AV Area (Vmean):   3.62 cm AV Area (VTI):     3.36 cm AV Vmax:           140.00 cm/s AV Vmean:          102.000 cm/s AV VTI:            0.304 m AV Peak Grad:      7.8 mmHg AV Mean Grad:      5.0 mmHg LVOT Vmax:         148.00 cm/s LVOT Vmean:        97.200 cm/s LVOT VTI:          0.269 m LVOT/AV VTI ratio: 0.88  AORTA Ao Root diam: 3.40 cm Ao Asc diam:  3.00 cm MITRAL VALVE MV Area (PHT): 2.69 cm    SHUNTS MV Decel Time: 282 msec    Systemic VTI:  0.27 m MV E velocity: 73.90 cm/s  Systemic Diam: 2.20 cm MV A velocity: 86.20 cm/s MV E/A ratio:  0.86 Cherlynn Kaiser MD Electronically signed by Cherlynn Kaiser MD Signature Date/Time: 07/10/2022/12:20:26 PM    Final    VAS [place] LOWER EXTREMITY VENOUS (DVT)  Result Date: 07/10/2022  Lower Venous DVT Study Patient Name:  Vincent [name]  Date of Exam:   07/10/2022 Medical Rec #: [id number]     Accession #:    [id number] Date of Birth: 1945-11-16      Patient Gender: M Patient Age:   76 years Exam Location:  [name] Procedure:      VAS [place] LOWER EXTREMITY VENOUS (DVT) Referring Phys: Cherylann [name] --------------------------------------------------------------------------------  Indications: Stroke.  Comparison Study:  No previous  exams Performing Technologist: Jody Hill RVT, RDMS  Examination Guidelines: A complete evaluation includes B-mode imaging, spectral Doppler, color Doppler, and power Doppler as needed of all accessible portions of each vessel. Bilateral testing is considered an integral part of a complete examination. Limited examinations for reoccurring indications may be performed as noted. The reflux portion of the exam is performed with the patient in reverse Trendelenburg.  +---------+---------------+---------+-----------+----------+--------------+ RIGHT    CompressibilityPhasicitySpontaneityPropertiesThrombus Aging +---------+---------------+---------+-----------+----------+--------------+ CFV      Full           Yes      Yes                                 +---------+---------------+---------+-----------+----------+--------------+ SFJ      Full                                                        +---------+---------------+---------+-----------+----------+--------------+ FV Prox  Full           Yes      Yes                                 +---------+---------------+---------+-----------+----------+--------------+ FV Mid   Full           Yes      Yes                                 +---------+---------------+---------+-----------+----------+--------------+ FV DistalFull           Yes      Yes                                 +---------+---------------+---------+-----------+----------+--------------+ PFV      Full                                                        +---------+---------------+---------+-----------+----------+--------------+ POP      Full           Yes      Yes                                 +---------+---------------+---------+-----------+----------+--------------+ PTV      Full                                                        +---------+---------------+---------+-----------+----------+--------------+ PERO     Full                                                         +---------+---------------+---------+-----------+----------+--------------+   +---------+---------------+---------+-----------+----------+--------------+  LEFT     CompressibilityPhasicitySpontaneityPropertiesThrombus Aging +---------+---------------+---------+-----------+----------+--------------+ CFV      Full           Yes      Yes                                 +---------+---------------+---------+-----------+----------+--------------+ SFJ      Full                                                        +---------+---------------+---------+-----------+----------+--------------+ FV Prox  Full           Yes      Yes                                 +---------+---------------+---------+-----------+----------+--------------+ FV Mid   Full           Yes      Yes                                 +---------+---------------+---------+-----------+----------+--------------+ FV DistalFull           Yes      Yes                                 +---------+---------------+---------+-----------+----------+--------------+ PFV      Full                                                        +---------+---------------+---------+-----------+----------+--------------+ POP      Full           Yes      Yes                                 +---------+---------------+---------+-----------+----------+--------------+ PTV      Full                                                        +---------+---------------+---------+-----------+----------+--------------+ PERO     Full                                                        +---------+---------------+---------+-----------+----------+--------------+     Summary: BILATERAL: - No evidence of deep vein thrombosis seen in the lower extremities, bilaterally. -No evidence of popliteal cyst, bilaterally.   *See table(s) above for measurements and observations. Electronically signed by  Monica Martinez MD on 07/10/2022 at 12:06:14 PM.    Final    DG CHEST PORT 1 VIEW  Result Date: 07/09/2022 CLINICAL DATA:  Weakness,  hypertension, asthma EXAM: PORTABLE CHEST 1 VIEW COMPARISON:  03/15/2011 FINDINGS: Normal cardiac and mediastinal contours. No focal pulmonary opacity. No pleural effusion or pneumothorax. No acute osseous abnormality. Status post ACDF. IMPRESSION: No active disease. Electronically Signed   By: Merilyn Baba M.D.   On: 07/09/2022 14:11   MR Brain W and Wo Contrast  Result Date: 07/09/2022 CLINICAL DATA:  Provided history: Neuro deficit, acute, stroke suspected. Weakness in left arm and left leg. EXAM: MRI HEAD WITHOUT AND WITH CONTRAST TECHNIQUE: Multiplanar, multiecho pulse sequences of the brain and surrounding structures were obtained without and with intravenous contrast. CONTRAST:  68mL GADAVIST GADOBUTROL 1 MMOL/ML IV SOLN COMPARISON:  CT angiogram head/neck 07/08/2022. Non-contrast head CT 07/08/2022. FINDINGS: Intermittently motion degraded examination, somewhat limiting evaluation. Most notably, the coronal T2 sequence is severely motion degraded. Brain: Mild generalized cerebral atrophy. 20 x 8 mm acute infarct within the right corona radiata/lentiform nucleus. Background mild multifocal T2 FLAIR hyperintense signal abnormality within the cerebral white matter, nonspecific but compatible with chronic small vessel ischemic disease. No evidence of an intracranial mass. No extra-axial fluid collection. No midline shift. No pathologic intracranial enhancement identified. Vascular: Maintained flow voids within the proximal large arterial vessels. Skull and upper cervical spine: No focal suspicious marrow lesion. Susceptibility artifact arising from ACDF hardware. Sinuses/Orbits: No mass or acute finding within the imaged orbits. Complete T2 hyperintense opacification of the right frontal sinus. Mild mucosal thickening within the left frontal sinus. Moderate mucosal  thickening within the bilateral ethmoid air cells. Mild mucosal thickening within the bilateral sphenoid and maxillary sinuses. IMPRESSION: 20 x 8 mm acute infarct within the right corona radiata/basal ganglia. Background mild chronic small vessel ischemic disease within the cerebral white matter. Mild generalized cerebral atrophy. Electronically Signed   By: Kellie Simmering D.O.   On: 07/09/2022 12:10   CT ANGIO HEAD NECK W WO CM  Result Date: 07/08/2022 CLINICAL DATA:  Left-sided weakness. EXAM: CT ANGIOGRAPHY HEAD AND NECK TECHNIQUE: Multidetector CT imaging of the head and neck was performed using the standard protocol during bolus administration of intravenous contrast. Multiplanar CT image reconstructions and MIPs were obtained to evaluate the vascular anatomy. Carotid stenosis measurements (when applicable) are obtained utilizing NASCET criteria, using the distal internal carotid diameter as the denominator. RADIATION DOSE REDUCTION: This exam was performed according to the departmental dose-optimization program which includes automated exposure control, adjustment of the mA and/or kV according to patient size and/or use of iterative reconstruction technique. CONTRAST:  43mL OMNIPAQUE IOHEXOL 350 MG/ML SOLN COMPARISON:  None Available. FINDINGS: CTA NECK FINDINGS Aortic arch: Standard 3 vessel aortic arch with mild atherosclerotic plaque. No stenosis of the arch vessel origins. Right carotid system: Patent with a small amount of calcified and soft plaque in the carotid bulb. No evidence of a significant stenosis or dissection. Tortuous mid cervical ICA. Left carotid system: Patent with a small amount of predominantly calcified plaque in the carotid bulb. No evidence of a significant stenosis or dissection. Tortuous mid cervical ICA. Vertebral arteries: Patent and codominant without evidence of a significant stenosis or dissection. Skeleton: C3-C7 ACDF widespread cervical facet arthrosis which is  asymmetrically severe on the right. Other neck: No evidence of cervical lymphadenopathy or mass. Diminutive or absent right thyroid lobe. Upper chest: 6 mm subpleural nodule in the left upper lobe (series 5, image 19). Additional 3 mm nodule in the left upper lobe (series 5, image 26). Review of the MIP images confirms the above findings CTA HEAD FINDINGS Anterior  circulation: The internal carotid arteries are patent from skull base to carotid termini with atherosclerosis resulting in mild bilateral cavernous and mild right and moderate left proximal supraclinoid stenoses. ACAs and MCAs are patent without evidence of a proximal branch occlusion or significant proximal stenosis. No aneurysm is identified. Posterior circulation: The intracranial vertebral arteries are patent to the basilar with atherosclerotic irregularity and up to mild stenosis bilaterally. The basilar artery is patent with a mild stenosis in its midportion. There is a small left posterior communicating artery. Both PCAs are patent without evidence of a significant proximal stenosis on the right. There is a severe proximal left P2 stenosis. No aneurysm is identified. Venous sinuses: Patent. Anatomic variants: None. Review of the MIP images confirms the above findings These results were called by telephone at the time of interpretation on 07/08/2022 at 10:10 am to Dr. Leanord Asal, who verbally acknowledged these results. IMPRESSION: 1. No emergent large vessel occlusion. 2. Intracranial atherosclerosis including moderate left and mild right ICA stenoses, mild distal vertebral and basilar artery stenoses, and a severe left P2 stenosis. 3. Widely patent cervical carotid arteries. 4. Two left upper lobe pulmonary nodules measuring up to 6 mm. Per Fleischner Society Guidelines, recommend a non-contrast Chest CT at 3-6 months, then consider another non-contrast Chest CT at 18-24 months. If patient is low risk for malignancy, non-contrast Chest CT at  18-24 months is optional. These guidelines do not apply to immunocompromised patients and patients with cancer. Follow up in patients with significant comorbidities as clinically warranted. For lung cancer screening, adhere to Lung-RADS guidelines. Reference: Radiology. 2017; 284(1):228-43. 5.  Aortic Atherosclerosis (ICD10-I70.0). Electronically Signed   By: Logan Bores M.D.   On: 07/08/2022 10:26   CT HEAD CODE STROKE WO CONTRAST  Result Date: 07/08/2022 CLINICAL DATA:  Code stroke.  Left-sided weakness. EXAM: CT HEAD WITHOUT CONTRAST TECHNIQUE: Contiguous axial images were obtained from the base of the skull through the vertex without intravenous contrast. RADIATION DOSE REDUCTION: This exam was performed according to the departmental dose-optimization program which includes automated exposure control, adjustment of the mA and/or kV according to patient size and/or use of iterative reconstruction technique. COMPARISON:  None Available. FINDINGS: Brain: There is no evidence of an acute infarct, intracranial hemorrhage, mass, midline shift, or extra-axial fluid collection. The ventricles and sulci are within normal limits for age. Hypodensities in the cerebral white matter bilaterally are nonspecific but compatible with mild chronic small vessel ischemic disease. Vascular: Calcified atherosclerosis at the skull base. No hyperdense vessel. Skull: No fracture. Prominent periapical lucency involving a right maxillary molar tooth. Sinuses/Orbits: Mild mucosal thickening in the paranasal sinuses. Clear mastoid air cells. Unremarkable orbits. Other: None. ASPECTS [facility] Stroke Program Early CT Score) - Ganglionic level infarction (caudate, lentiform nuclei, internal capsule, insula, M1-M3 cortex): 7 - Supraganglionic infarction (M4-M6 cortex): 3 Total score (0-10 with 10 being normal): 10 IMPRESSION: 1. No evidence of acute intracranial abnormality. ASPECTS of 10. 2. Mild chronic small vessel ischemic disease.  These results were called by telephone at the time of interpretation on 07/08/2022 at 10:10 am to Dr. Leanord Asal, who verbally acknowledged these results. Electronically Signed   By: Logan Bores M.D.   On: 07/08/2022 10:10    Labs:  Basic Metabolic Panel: Recent Labs  Lab 07/19/22 0557 07/21/22 0626  NA 133* 133*  K 4.3 4.0  CL 100 100  CO2 24 22  GLUCOSE 152* 141*  BUN 18 18  CREATININE 0.86 0.81  CALCIUM 8.9 9.1  CBC: Recent Labs  Lab 07/19/22 0557  WBC 11.5*  HGB 15.2  HCT 42.6  MCV 89.1  PLT 247    CBG: No results for input(s): "GLUCAP" in the last 168 hours.   Brief HPI:   Torez Bratland is a 76 y.o. right-handed male with history of depression, diabetes mellitus, stable left upper lobe pulmonary nodules, papillary cancer, hypertension, asthma with tobacco alcohol use.  Per chart review lives alone.  Independent prior to admission.  Presented 07/09/2022 with acute onset of left-sided weakness.  Cranial CT scan negative.  CTA no emergent large vessel occlusion.  2 left upper lobe pulmonary nodules measuring 6 mm recommendations noncontrast CT 3 to 6 months.  Patient did not receive tPA.  MRI showed a 20 x 8 mm right frontal radiata basal ganglia.  Admission chemistries unremarkable except sodium 130 glucose 203 alcohol negative.  Echocardiogram with ejection fraction of 60 to 65% no wall motion abnormalities.  Placed on low-dose aspirin and Plavix x3 weeks then Plavix alone.  Subcutaneous Lovenox for DVT prophylaxis.  Venous Doppler studies negative.  Therapy evaluations completed due to patient decreased functional mobility left-sided weakness was admitted for a comprehensive rehab program.   [name] Course: Win Raymundo was admitted to rehab 07/13/2022 for inpatient therapies to consist of PT, ST and OT at least three hours five days a week. Past admission physiatrist, therapy team and rehab RN have worked together to provide customized collaborative inpatient  rehab.  Pertaining to patient's right basal ganglia corona radiata infarction remained stable he will continue on aspirin and Plavix x3 weeks then Plavix alone.  Follow-up neurology services.  Subcutaneous Lovenox for DVT prophylaxis.  Permissive hypertension patient on Norvasc and Zestoretic prior to admission resume as needed.  Tobacco alcohol use NicoDerm patch applied receiving counts regards to cessation of tobacco and alcohol.  Hypothyroidism with hormone supplement ongoing.  Diabetes mellitus hemoglobin A1c 6.8 Glucophage as indicated with diabetic teaching.  Lipitor for hyperlipidemia.  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  Plan was to have follow-up CT in 3 to 6 months.  During patient's [name] re[facility] stay he noted a small mass to his nose patient quite adamant on plan for biopsy and removal which had been scheduled at the [name] and it was discussed at length this was not emergent patient was quite adamant about having this appointment maintained.  Skin wound   left second toe patient has had for approximately 6 months no odor noted cleansed with warm soap and water daily and would follow-up with the [facility]   Blood pressures were monitored on TID basis and controlled  Diabetes has been monitored with ac/hs CBG checks and SSI was use prn for tighter BS control.    Rehab course: During patient's stay in rehab weekly team conferences were held to monitor patient's progress, set goals and discuss barriers to discharge. At admission, patient required moderate assist step pivot transfers min mod assist with a rolling walker  Physical exam.  Blood pressure 140/76 pulse 76 temperature 97.8 respirations 18 oxygen saturation 97% room air Constitutional.  No acute distress HEENT Head.  Normocephalic and atraumatic Eyes.  Pupils round and reactive to  light no discharge without nystagmus Neck.  Supple nontender no JVD without thyromegaly Cardiac regular rate and rhythm without any extra sounds or murmur heard Abdomen.  Soft nontender positive bowel sounds  without rebound Respiratory effort normal no respiratory distress without wheeze Skin.  A few scattered bruises and abrasion.  Noted soft tissue mass on lateral side of nose without any redness or drainage Neurologic.  Alert normal insight and awareness.  CN exam unremarkable.  Left upper extremity 4 -/5 deltoid 4+ biceps, triceps 4-4+ with wrist flexion and grip.  Left lower extremity 4/5 proximal to distal right upper and right lower extremity 5/5 proximal to distal.   Media Information   Document Information  Photos  R toe mass  07/22/2022 10:23  Attached To:  [name] Encounter on 07/13/22   Source Information  Gertie Gowda, DO  [facility]   He/She  has had improvement in activity tolerance, balance, postural control as well as ability to compensate for deficits. He/She has had improvement in functional use RUE/LUE  and RLE/LLE as well as improvement in awareness.  Working with energy conservation techniques.  Ambulating short distances with a rolling walker and min assist.  Noted ADLs min assist with basic self-care.       Disposition: Discharge to home    Diet: Diabetic diet  Special Instructions: No driving smoking or alcohol  Follow-up with PCP and monitoring of sodium levels  Bactroban to left second toe daily cover with foam dressing change foam dressing every 3 days as needed  Medications at discharge 1.  Tylenol as needed 2.  Aspirin 81 mg daily until 07/28/2022 and stop 3.  Lipitor 40 mg p.o. daily 4.  Plavix 75 mg p.o. daily 5.  Folic acid 1 mg p.o. daily 6.  Synthroid 125 mcg p.o. daily 7.  Glucophage 500 mg p.o. twice daily 8.  Multivitamin daily 9.  NicoDerm patch taper as directed 10.  Sodium chloride tablet 1 g p.o. twice daily 11.   Norvasc 5 mg daily 12.  Zestoretic 20-12.5 mg daily 13.  Albuterol inhaler 2 puffs every 6 hours as needed shortness of breath   30-35 minutes were spent completing discharge summary and discharge planning    Discharge Instructions     Ambulatory referral to Neurology   Complete by: As directed    An appointment is requested in approximately: 4 weeks right basal ganglia corona radiata infarction   Ambulatory referral to Physical Medicine Rehab   Complete by: As directed    Moderate complexity follow-up 1 to 2 weeks right basal ganglia corona radiata infarction        Follow-up Information     Durel Salts C, DO Follow up.   Specialty: Physical Medicine and Rehabilitation Why: office to call for appointment Contact information: [address] [place] [address] [phone]                 Signed: Cathlyn Parsons 07/23/2022, 5:23 AM

## 2022-07-15 NOTE — Progress Notes (Signed)
Pt was served 2 breakfast trays and he refused them both.  He placed order for a different tray.

## 2022-07-15 NOTE — Progress Notes (Signed)
Physical Therapy Session Note  Patient Details  Name: Vincent [name] MRN: [id number] Date of Birth: 08/11/1946  Today's Date: 07/15/2022 PT Individual Time: 4431-5400 PT Individual Time Calculation (min): 73 min   Short Term Goals: Week 1:  PT Short Term Goal 1 (Week 1): Pt will perform bed to chair transfer with CGA. PT Short Term Goal 2 (Week 1): Pt will ambulate x100' with CGA and LRAD. PT Short Term Goal 3 (Week 1): Pt will improved Berg by MCID.  Skilled Therapeutic Interventions/Progress Updates: Pt presented in w/c agreeable to therapy. Pt denies pain during session. Pt transported to rehab gym for energy conservation. Pt ambulated to high/low mat ~51f with RW and CGA. Pt noted to have some hyperextension with step through. Pt then participated in standing balance reaching activity placing clothespins on basketball net with LLE for forced use. Pt performed both standing on level tile as well as standing on Airex for increased ankle strategy. Pt also participated in static standing while on Airex then progressed to Sit to stand while standing on Airex x 5. Pt noted to have slight bias to R. Pt also participated in Sit to stand with RLE on 2in step for forced use of LLE. With mirror feedback pt able to maintain improved midline orientation. Pt then transitioned to gait training without AD 243fx 2 with mod fading to minA with verbal cues for LLE clearance, minimizing hyperextension through stance phase of LLE, and initially noting of increased R lateral lean to compensate for LLE clearance with improved with distance. Pt ambulated 3518fith L HHA with decreased lean, improved step length and improved BOS. Pt then transferred to ortho gym and participated in NuStep L3 x 6 min for general conditioning. Pt transported back to room at end of session and requesting to use bathroom. Performed ambulatory transfer to toilet with RW and CGA and pt was able to perform toilet transfers with CGA and supervision  for LB clothing management. Pt with continent urinary void documented in flow sheet. Pt returned to w/c in same manner as prior and pt remained in w/c at end of session with belt alarm on, call bell within reach and needs met.       Therapy Documentation Precautions:  Precautions Precautions: Fall Precaution Comments: Impulsive Restrictions Weight Bearing Restrictions: No General:   Vital Signs: Therapy Vitals Temp: 98.5 F (36.9 C) Pulse Rate: 86 Resp: 15 BP: 132/72 Patient Position (if appropriate): Sitting Oxygen Therapy SpO2: 100 % O2 Device: Room Air Pain:   Mobility:   Locomotion :    Trunk/Postural Assessment :    Balance:   Exercises:   Other Treatments:      Therapy/Group: Individual Therapy  Sharvil Hoey 07/15/2022, 4:11 PM

## 2022-07-15 NOTE — Progress Notes (Signed)
Physical Therapy Session Note  Patient Details  Name: Vincent [name] MRN: [id number] Date of Birth: 11/04/1945  Today's Date: 07/15/2022 PT Individual Time: 1103-1200 PT Individual Time Calculation (min): 57 min   Short Term Goals: Week 1:  PT Short Term Goal 1 (Week 1): Pt will perform bed to chair transfer with CGA. PT Short Term Goal 2 (Week 1): Pt will ambulate x100' with CGA and LRAD. PT Short Term Goal 3 (Week 1): Pt will improved Berg by MCID.  Skilled Therapeutic Interventions/Progress Updates:     Pt received seated in [facility] and agrees to therapy. No complaint of pain. WC transport to gym for time management. Pt performs stand step transfer to mat with RW and cues for sequencing and hand placement. Pt performs NMR for standing balance and L hemibody NMR. Pt initially performs toe taps on 4 inch step with L foot and using bilateral upper extremity support on RW, x10. Following rest break, pt performs x10 with R lower extremity with PT providing minA to facilitate lateral weight shifting, trunk stability and neutral posture, and tactile cueing to prevent hyperextension of L knee. Immediately following pt completes x10 L toe taps with L upper extremity support. Following seated rest break, pt performs same activity with mirror added for visual feedback. PT provides education and demonstration of maintaining engagement of L knee extensors, while maintaining a "soft bend" without snapping into hyperextension. Pt completes x10 with R leg first and then x10 with L leg, not requiring upper extremity assistance, with PT providing minA. Pt completes one additional bout following rest break. Pt ambulates x100' with RW and minA, with cues for posture tactile feedback at L knee to prevent hyperextension. Following rest break, pt ambulates x30' with R handrail and same assistance. Pt left seated in WC with alarm intact and all needs within reach.  Therapy Documentation Precautions:   Precautions Precautions: Fall Precaution Comments: Impulsive Restrictions Weight Bearing Restrictions: No   Therapy/Group: Individual Therapy  Breck Coons, PT, DPT 07/15/2022, 5:21 PM

## 2022-07-15 NOTE — Progress Notes (Addendum)
PROGRESS NOTE   Subjective/Complaints:  Pt seen at bedside. [facility]iscussed [facility], as this is a surgical consult and not a surgical visit; patient upset but agreeable. He endorses he will refuse all future BG sticks and will not allow 1-2 days further for BG monitoring to increase metformin. Finally, endorses BP well controlled at home 120-130s on Lisinopril and Norvasc.   ROS: Denies fevers, chills, N/V, abdominal pain, constipation, diarrhea, SOB, cough, chest pain, new weakness or paraesthesias.    Objective:   No results found. Recent Labs    07/14/22 0825  WBC 10.0  HGB 14.9  HCT 41.6  PLT 209    Recent Labs    07/14/22 0825 07/15/22 0508  NA 132* 134*  K 3.8 4.1  CL 96* 100  CO2 28 25  GLUCOSE 233* 147*  BUN 19 21  CREATININE 0.84 0.81  CALCIUM 8.7* 8.9     Intake/Output Summary (Last 24 hours) at 07/15/2022 0916 Last data filed at 07/15/2022 0839 Gross per 24 hour  Intake 591 ml  Output 200 ml  Net 391 ml         Physical Exam: Vital Signs Blood pressure (!) 146/72, pulse 70, temperature 98.2 F (36.8 C), temperature source Oral, resp. rate 20, height 5\' 8"  (1.727 m), weight 72.8 kg, SpO2 93 %.   Physical Exam Constitution: Appropriate appearance for age. No apparnet distress   HEENT: PERRL, EOMI grossly intact. R lateral nasal mass, nonexudative, stable Resp: CTAB. No rales, rhonchi, or wheezing. Cardio: RRR. No mumurs, rubs, or gallops. 1+ peripheral edema.  Abdomen: Nondistended. Nontender. +bowel sounds. Psych: Appropriate mood and affect. Neuro: AAOx4. CN 2-12 exam unremarkable.   LUE 4-/5 deltoid 4+ biceps, triceps, 4 to 4+ with wrist flexion and grip. LLE 4/5 prox to distal.  RUE and RLE 5/5 prox to distal.  Sensation to light touch intact.  Skin: A few scattered bruises and abrasions. Chronic vascular changes in the distal LE's - stable   Assessment/Plan: 1.  Functional deficits which require 3+ hours per day of interdisciplinary therapy in a comprehensive inpatient rehab setting. Physiatrist is providing close team supervision and 24 hour management of active medical problems listed below. Physiatrist and rehab team continue to assess barriers to discharge/monitor patient progress toward functional and medical goals  Care Tool:  Bathing  Bathing activity did not occur: Refused           Bathing assist       Upper Body Dressing/Undressing Upper body dressing   What is the patient wearing?: Pull over shirt    Upper body assist Assist Level: Minimal Assistance - Patient > 75%    Lower Body Dressing/Undressing Lower body dressing      What is the patient wearing?: Underwear/pull up, Pants     Lower body assist Assist for lower body dressing: Minimal Assistance - Patient > 75%     Toileting Toileting    Toileting assist Assist for toileting: Minimal Assistance - Patient > 75%     Transfers Chair/bed transfer  Transfers assist  Chair/bed transfer activity did not occur: Safety/medical concerns  Chair/bed transfer assist level: Minimal Assistance - Patient > 75%  Locomotion Ambulation   Ambulation assist      Assist level: Moderate Assistance - Patient 50 - 74% Assistive device: No Device Max distance: 25'   Walk 10 feet activity   Assist     Assist level: Moderate Assistance - Patient - 50 - 74% Assistive device: No Device   Walk 50 feet activity   Assist Walk 50 feet with 2 turns activity did not occur: Safety/medical concerns         Walk 150 feet activity   Assist Walk 150 feet activity did not occur: Safety/medical concerns         Walk 10 feet on uneven surface  activity   Assist     Assist level: Minimal Assistance - Patient > 75% Assistive device:  (R hand rail)   Wheelchair     Assist Is the patient using a wheelchair?: No             Wheelchair 50 feet with  2 turns activity    Assist            Wheelchair 150 feet activity     Assist          Blood pressure (!) 146/72, pulse 70, temperature 98.2 F (36.8 C), temperature source Oral, resp. rate 20, height 5\' 8"  (1.727 m), weight 72.8 kg, SpO2 93 %.    Medical Problem List and Plan: 1. 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 Pt with new left sided weakness although he had some prior LUE weakness from a cervical spondylosis/myelopathy (hx of ACDF)             -patient may shower             -ELOS/Goals: 7-10 days, mod I goals with PT and OT. Pt has some nearby family and friends who can check in on him. 1 level home with level entry.  2.  Antithrombotics: -DVT/anticoagulation:  Pharmaceutical: Lovenox             -antiplatelet therapy: Aspirin and Plavix x3 weeks then Plavix alone 3. Pain Management: Tylenol as needed 4. Mood/Behavior/Sleep: Provide emotional support             -antipsychotic agents: N/A 5. Neuropsych/cognition: This patient is capable of making decisions on his own behalf. 6. Skin/Wound Care: Routine skin check              - [facility]; SW looking into rescheduling  7. Fluids/Electrolytes/Nutrition: Routine in and outs with follow-up chemistries              - LBM 10/18  8.  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  Patient on Norvasc 10 mg daily as well as Zestoretic 20-12.5 mg daily prior to admission.  Resume as needed             -10/19: resume Norvasc at 5 mg daily    07/15/2022    4:55 AM 07/14/2022    7:45 PM 07/14/2022    1:20 PM  Vitals with BMI  Systolic 892 119 417  Diastolic 72 66 74  Pulse 70 68 81    9.  Tobacco/alcohol use.  NicoDerm patch.  Provide counseling 10.  Hypothyroidism.  Synthroid. Labs WNL 10/16.  11.  Diabetes mellitus.  Hemoglobin A1c 6.8.  Resume Glucophage 500 mg twice daily             -monitor sugars AC and HS -  DC d/t patient refusal Recent Labs    07/14/22 1203 07/14/22 1626 07/14/22 2018  GLUCAP 129* 150* 192*      12.  Hyperlipidemia.  Lipitor 13.  Hyponatremia.  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  Continue sodium chloride tabs for now.  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 Monitor with [facility] labs unless change in mental status, diuretics, or fluid intakes  14.  History of pulmonary nodules.  Follow-up CT 3 to 6 months.    LOS: 2 days A FACE TO FACE EVALUATION WAS PERFORMED  Angelina Sheriff 07/15/2022, 9:16 AM

## 2022-07-16 NOTE — IPOC Note (Signed)
Overall Plan of Care [facility]) Patient Details Name: Vincent [name] MRN: [id number] DOB: 1946/08/29  Admitting Diagnosis: Cerebrovascular accident (CVA) of right basal ganglia [facility])  Hospital Problems: Principal Problem:   Cerebrovascular accident (CVA) of right basal ganglia (HCC)     Functional Problem List: Nursing Bowel, Medication Management, Safety, Endurance  PT Perception, Balance, Behavior, Endurance, Motor, Safety  OT Balance, Behavior, Edema, Endurance, Cognition, Motor, Nutrition, Pain, Perception, Safety, Sensory  SLP    TR         Basic ADL's: OT Eating, Grooming, Bathing, Dressing, Toileting     Advanced  ADL's: OT       Transfers: PT Bed Mobility, Bed to Chair, Car, Occupational psychologist, Research scientist (life sciences): PT Ambulation, Stairs     Additional Impairments: OT Fuctional Use of Upper Extremity  SLP        TR      Anticipated Outcomes Item Anticipated Outcome  Self Feeding Mod I  Swallowing      Basic self-care  Mod I/supervision  Toileting  Supervision   Bathroom Transfers Supervision  Bowel/Bladder  manage bowel w mod I assist  Transfers  Mod(I)  Locomotion  Mod(I)  Communication     Cognition     Pain  n/a  Safety/Judgment  manage w cues   Therapy Plan: PT Intensity: Minimum of 1-2 x/day ,45 to 90 minutes PT Frequency: 5 out of 7 days PT Duration Estimated Length of Stay: 10-12 Days OT Intensity: Minimum of 1-2 x/day, 45 to 90 minutes OT Frequency: 5 out of 7 days OT Duration/Estimated Length of Stay: 10-12 days     Team Interventions: Nursing Interventions Bowel Management, Patient/Family Education, Disease Management/Prevention, Medication Management, Discharge Planning  PT interventions Ambulation/gait training, Community reintegration, DME/adaptive equipment instruction, Psychosocial support, Stair training, UE/LE Strength taining/ROM, Neuromuscular re-education, Warden/ranger, Discharge planning,  Functional electrical stimulation, Pain management, UE/LE Coordination activities, Therapeutic Activities, Cognitive remediation/compensation, Functional mobility training, Disease management/prevention, Patient/family education, Splinting/orthotics, Therapeutic Exercise, Visual/perceptual remediation/compensation  OT Interventions Balance/vestibular training, Disease mangement/prevention, Cognitive remediation/compensation, Community reintegration, Discharge planning, DME/adaptive equipment instruction, Functional electrical stimulation, Functional mobility training, Neuromuscular re-education, Patient/family education, Psychosocial support, Self Care/advanced ADL retraining, Pain management, Therapeutic Activities, Skin care/wound managment, Splinting/orthotics, UE/LE Coordination activities, Therapeutic Exercise, UE/LE Strength taining/ROM, Visual/perceptual remediation/compensation, Wheelchair propulsion/positioning  SLP Interventions    TR Interventions    SW/CM Interventions Discharge Planning, Psychosocial Support, Patient/Family Education   Barriers to Discharge MD  Medical stability, Home enviroment access/loayout, Lack of/limited family support, Insurance for SNF coverage, and Medication compliance  Nursing Lack of/limited family support, Home environment access/layout 1 level apt solo;  PT      OT Decreased caregiver support Patient lives alone, but states he has family that lives close  SLP      SW Decreased caregiver support, Lack of/limited family support     Team Discharge Planning: Destination: PT-Home ,OT- Home , SLP-  Projected Follow-up: PT-Outpatient PT, OT-  Outpatient OT, Home health OT (OPOT vs HHOT), SLP-  Projected Equipment Needs: PT-To be determined, OT- To be determined, SLP-  Equipment Details: PT- , OT-  Patient/family involved in discharge planning: PT- Patient,  OT-Patient, SLP-   MD ELOS: 7-10 days Medical Rehab Prognosis:  Excellent Assessment: The  patient has been admitted for CIR therapies with the diagnosis of right corona radiata/BG stroke. The team will be addressing functional mobility, strength, stamina, balance, safety, adaptive techniques and equipment, self-care, bowel and bladder mgt, patient and caregiver education,.  Goals have been set at Mod I. Anticipated discharge destination is home.   Gertie Gowda, DO 07/16/2022      See Team Conference Notes for weekly updates to the plan of care

## 2022-07-16 NOTE — Progress Notes (Signed)
Physical Therapy Session Note  Patient Details  Name: Vincent [name] MRN: [id number] Date of Birth: 1946-09-02  Today's Date: 07/16/2022 PT Individual Time: 1002-1100 and 8299-3716 PT Individual Time Calculation (min): 58 min and 70 min  Short Term Goals: Week 1:  PT Short Term Goal 1 (Week 1): Pt will perform bed to chair transfer with CGA. PT Short Term Goal 2 (Week 1): Pt will ambulate x100' with CGA and LRAD. PT Short Term Goal 3 (Week 1): Pt will improved Berg by MCID.  Skilled Therapeutic Interventions/Progress Updates:     1st Session: Pt received seated in [facility] and agrees to therapy. No complaint of pain. WC transport to gym for time management. Pt performs stand step transfer to Nustep with minA and cues for body mechanics and sequencing. Pt completes Nustep for 12:00 on workload of 5 with average steps per minute ~50. Performed for endurance training as well as reciprocal coordination training. PT provides cues for foot placement and completing full available ROM.   PT provides pt with L posterior strut AFO to assist with toe clearance on L as well as NM feedback to manage knee hyperextension during stance phase. Pt then ambulates x200' with RW and minA, with tactile cueing at L knee to prevent snapping in to extension, and verbal cues on performance for real-time feedback. Pt able to control knee extension ~25% of time but has frequent lapses. Pt states that he feels like he becomes distracted and his mind wanders, causing knee to snap into extension. Marland Kitchen  Pt performs standing TKEs with green theraband on L leg. PT provides demonstration and provides rationale for performance. Pt then completes reps to fatigue with PT providing cues on body mechanics and optimal performance. ~x30 total. WC transport back to room. Left seated in WC with alarm intact and all needs within reach.  2nd Session: Pt received seated in [facility] and agrees to therapy. No complaint of pain. WC transport to gym for time  management. Pt performs NMR for standing balance and L hemibody in parallel bars. Mirror provided for visual feedback. Pt performs x20 standing marches with cue to tap thigh on bar to promote increased hip flexion. PT provides cues for body mechanics, including tactile cueing at L hip to facilitate hip abductor activation. Pt performs side stepping to the R and L facing mirror with cues to maintain neutral hip rotation to load hip abductors, 2x 20' with seated rest break. Pt performs x10 minisquats using bar for balance but cued to not lace weight through upper extremities. PT provides cues for neutral trunk positioning as well as optimal sequencing of movement. Following rest break, pt completes 2x10 minisquats with green theraband to increase activation of hip abductors.   Pt ambulates x80' without AD, requiring modA to facilitate lateral weight shifting, trunk stability, and prevent L knee from having decreased control transitioning into extension during stance phase. WC transport back to room. Left seated with alarm intact and all needs within reach.  Therapy Documentation Precautions:  Precautions Precautions: Fall Precaution Comments: Impulsive Restrictions Weight Bearing Restrictions: No   Therapy/Group: Individual Therapy  Breck Coons, PT, DPT 07/16/2022, 5:14 PM

## 2022-07-16 NOTE — Progress Notes (Signed)
PROGRESS NOTE   Subjective/Complaints:  Pt seen in WC at bedside. Doing well, no complaints overnight. States RLE intermittently edematous but has had dopplers as OP without DVT.    ROS: Denies fevers, chills, N/V, abdominal pain, constipation, diarrhea, SOB, cough, chest pain, new weakness or paraesthesias.    Objective:   No results found. Recent Labs    07/14/22 0825  WBC 10.0  HGB 14.9  HCT 41.6  PLT 209    Recent Labs    07/14/22 0825 07/15/22 0508  NA 132* 134*  K 3.8 4.1  CL 96* 100  CO2 28 25  GLUCOSE 233* 147*  BUN 19 21  CREATININE 0.84 0.81  CALCIUM 8.7* 8.9     Intake/Output Summary (Last 24 hours) at 07/16/2022 1154 Last data filed at 07/16/2022 0700 Gross per 24 hour  Intake 472 ml  Output 400 ml  Net 72 ml         Physical Exam: Vital Signs Blood pressure 135/86, pulse 77, temperature 98.3 F (36.8 C), resp. rate 18, height 5\' 8"  (1.727 m), weight 72.8 kg, SpO2 96 %.   Physical Exam Constitution: Appropriate appearance for age. No apparnet distress   HEENT: PERRL, EOMI grossly intact. R lateral nasal mass, nonexudative, stable Resp: CTAB. No rales, rhonchi, or wheezing. Cardio: RRR. No mumurs, rubs, or gallops. No peripheral edema. +TEDs Abdomen: Nondistended. Nontender. +bowel sounds. Psych: Appropriate mood and affect. Neuro: AAOx4. CN 2-12 exam unremarkable.   LUE 4-/5 deltoid 4+ biceps, triceps, 4 to 4+ with wrist flexion and grip. LLE 4-/5 LLE hip flexor, 5-/5 knee extensor, 5-/5 ankle DF and PF.  RUE and RLE 5/5 prox to distal.  Sensation to light touch intact.  Skin: A few scattered bruises and abrasions. Chronic vascular changes in the distal LE's - stable   Assessment/Plan: 1. Functional deficits which require 3+ hours per day of interdisciplinary therapy in a comprehensive inpatient rehab setting. Physiatrist is providing close team supervision and 24 hour  management of active medical problems listed below. Physiatrist and rehab team continue to assess barriers to discharge/monitor patient progress toward functional and medical goals  Care Tool:  Bathing  Bathing activity did not occur: Refused           Bathing assist       Upper Body Dressing/Undressing Upper body dressing   What is the patient wearing?: Pull over shirt    Upper body assist Assist Level: Minimal Assistance - Patient > 75%    Lower Body Dressing/Undressing Lower body dressing      What is the patient wearing?: Underwear/pull up, Pants     Lower body assist Assist for lower body dressing: Minimal Assistance - Patient > 75%     Toileting Toileting    Toileting assist Assist for toileting: Minimal Assistance - Patient > 75%     Transfers Chair/bed transfer  Transfers assist  Chair/bed transfer activity did not occur: Safety/medical concerns  Chair/bed transfer assist level: Minimal Assistance - Patient > 75%     Locomotion Ambulation   Ambulation assist      Assist level: Moderate Assistance - Patient 50 - 74% Assistive device: No Device Max distance:  25'   Walk 10 feet activity   Assist     Assist level: Moderate Assistance - Patient - 50 - 74% Assistive device: No Device   Walk 50 feet activity   Assist Walk 50 feet with 2 turns activity did not occur: Safety/medical concerns         Walk 150 feet activity   Assist Walk 150 feet activity did not occur: Safety/medical concerns         Walk 10 feet on uneven surface  activity   Assist     Assist level: Minimal Assistance - Patient > 75% Assistive device:  (R hand rail)   Wheelchair     Assist Is the patient using a wheelchair?: No             Wheelchair 50 feet with 2 turns activity    Assist            Wheelchair 150 feet activity     Assist          Blood pressure 135/86, pulse 77, temperature 98.3 F (36.8 C), resp. rate  18, height 5\' 8"  (1.727 m), weight 72.8 kg, SpO2 96 %.    Medical Problem List and Plan: 1. 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 Pt with new left sided weakness although he had some prior LUE weakness from a cervical spondylosis/myelopathy (hx of ACDF)             -patient may shower             -ELOS/Goals: 7-10 days, mod I goals with PT and OT. Pt has some nearby family and friends who can check in on him. 1 level home with level entry.  2.  Antithrombotics: -DVT/anticoagulation:  Pharmaceutical: Lovenox             -antiplatelet therapy: Aspirin and Plavix x3 weeks then Plavix alone 3. Pain Management: Tylenol as needed 4. Mood/Behavior/Sleep: Provide emotional support             -antipsychotic agents: N/A 5. Neuropsych/cognition: This patient is capable of making decisions on his own behalf. 6. Skin/Wound Care: Routine skin check              - [facility]; [facility] looking into rescheduling  7. Fluids/Electrolytes/Nutrition: Routine in and outs with follow-up chemistries              - LBM 10/18  8.  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  Patient on Norvasc 10 mg daily as well as Zestoretic 20-12.5 mg daily prior to admission.  Resume as needed     07/16/2022    5:28 AM 07/15/2022    2:51 PM 07/15/2022    4:55 AM  Vitals with BMI  Systolic 144 315 400  Diastolic 86 72 72  Pulse 77 86 70              -10/19: resume Norvasc at 5 mg daily          - 10/20: BP improved, monitor  9.  Tobacco/alcohol use.  NicoDerm patch.  Provide counseling 10.  Hypothyroidism.  Synthroid. Labs WNL 10/16.  11.  Diabetes mellitus.  Hemoglobin A1c 6.8.  Resume Glucophage 500 mg twice daily             -monitor sugars AC and HS - DC d/t patient refusal   12.  Hyperlipidemia.  Lipitor 13.  Hyponatremia.  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  Continue sodium  chloride tabs for now.  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 Monitor with [facility] labs unless change in mental status, diuretics, or fluid intakes  14.  History of pulmonary nodules.  Follow-up CT 3 to 6 months.    LOS: 3 days A FACE TO FACE EVALUATION WAS PERFORMED  Angelina Sheriff 07/16/2022, 11:54 AM

## 2022-07-16 NOTE — Progress Notes (Signed)
Occupational Therapy Session Note  Patient Details  Name: Vincent [name] MRN: [id number] Date of Birth: 1945/10/06  Today's Date: 07/16/2022 OT Individual Time: 9450-3888 OT Individual Time Calculation (min): 70 min   Short Term Goals: Week 1:  OT Short Term Goal 1 (Week 1): Patient will maintain standing balance within grooming task with CGA OT Short Term Goal 2 (Week 1): Patient will complete toilet transfer with Min A OT Short Term Goal 3 (Week 1): Patient will recall hemi dressing techniques with min questioning cues.  Skilled Therapeutic Interventions/Progress Updates:    Pt greeted seated in wc and agreeable to OT treatment session> Pt reported already bathing and dressing with nurse tech. Functional ambulation to therapy gym with RW and CGA but with L knee hyperextension. Noted pt with edema in LLE, agreeable to TED hose. Educated on technique for donning TED hose, needed min A to complete. Worked on L hand [place] and shoulder FF with small peg board placed on vertical wall. Pt able to get L shoulder to ~100 degrees. Focus on distal control within [facility]. Pt frustrated with fine motor task despite deficits stating he can live his life just fine if his L hand doesn't return to 100%. Pt stated being on his own is what matters to him and being able to walk. OT changed gears and focused on balance and L LE strength and weight shifting. Standing on foam block, OT used mirror feedback to get pt to midline. Used NDT techniques and light cues to facilitate forward trunk and netural hip. OT assist with L knee hyperextension and buckling. [employer] Weight shift to the L to reach and place clothes pins on rung with L hand. Standing on foam block, 3 sets of 10 chest press, bicep curl, and straight arm raise using 2 lb dowel rod. Min A for balance without UE support. Sit<>stand sets with facilitation to maintain equal weight when powering up to standing. Pt ambulated back to room w/ RW and CGA and left seated in  wc with alarm belt on, call bell in reach, and needs met.   Therapy Documentation Precautions:  Precautions Precautions: Fall Precaution Comments: Impulsive Restrictions Weight Bearing Restrictions: No  Pain:  Denies pain   Therapy/Group: Individual Therapy  Valma Cava 07/16/2022, 8:44 AM

## 2022-07-17 NOTE — Progress Notes (Signed)
Occupational Therapy Session Note  Patient Details  Name: Vincent [name] MRN: [id number] Date of Birth: 06/18/1946  Today's Date: 07/17/2022 OT Individual Time: 1300-1355 & 1530-1630 OT Individual Time Calculation (min): 55 min & 60 min   Short Term Goals: Week 1:  OT Short Term Goal 1 (Week 1): Patient will maintain standing balance within grooming task with CGA OT Short Term Goal 2 (Week 1): Patient will complete toilet transfer with Min A OT Short Term Goal 3 (Week 1): Patient will recall hemi dressing techniques with min questioning cues.  Skilled Therapeutic Interventions/Progress Updates:    Session 1: Upon OT arrival, pt seated in w/c with son present in room. Pt reports no pain and is agreeable to OT treatment session. Treatment intervention with a focus on IADLs, dynamic standing balance, strengthening, UE coordination, and leisure. Pt propels himself to rehab apartment via w/c and SBA requiring increased time. Pt completes sit to stand transfer with SBA using RW and ambulates within kitchen to simulate item retrieval from fridge, microwave, cabinets, and pantry. Pt demonstrates no difficulty retrieving items and completes with SBA. Pt does require verbal cues for safe positioning within walker when retrieving items and demonstrates good carryover. Pt returns to his w/c performing stand to sit transfer with SBA. Pt plugs in vacuum cord to the wall and vacuums a smaller section of the room to simulate size of the living area in his apartment and pt completes with Min A for line and walker management. Discussed adapting task at home to performing while seated initially to reduce risk for falls. Pt verbalizes good understanding. Pt was transported to dayroom via w/c and total A for time and completes sit to stand transfer with SBA using RW. Pt ambulates with RW and SBA to the putting green and engages in multiple trials of putt putt using B UE and stands with SBA. Pt engages in ball toss hitting  ball with a plastic tube using the L UE for 10 reps demonstrating min difficulty and incoordination. Pt was returned to his room via w/c and total A and left in w/c at end of session with all needs met.  Session 2: Upon OT arrival, pt seated in w/c reporting no pain and is agreeable to OT treatment. Treatment intervention with a focus on coordination, scanning, and attention, and ROM. Pt was transported to ortho gym via w/c and total A and is situated infront of [employer]. To engage in two tasks. Pt taps dots on board using the L UE fin all planes or 3 minutes with a reaction time of 2.03 seconds and 56.9% accuracy for 3 minutes hitting 66 dots. Pt requires L UE support from this therapist for higher planes secondary to weakness. Pt then engages in bells cancellation assessment tapping all the bells in a picture of various objects and completes in 6 minutes and 6 seconds missing 1 bell. Pt was transported to main therapy gym via w/c and total A and flips over [employer] with the L UE from the blank side to the colored side demonstrating no difficulty but does require increased time. Pt then sorts quirkle game pieces based on shape and requires one verbal cue to recognize error of mixing squares with diamonds and pt was able to correct himself. Pt then completes finger<>palm translation picking up quikrle pieces in groups of 3 one at a time and returning them to the game bag. Pt requires min verbal cues for correct technique and demonstrates mod difficulty and increased  time to return all game pieces to the bag. Pt transported to finger ladder and completes sit to stand transfer with SBA and performs 4 times requiring verbal cues for body positioning and cues to not hike shoulder. Pt able to get up to 28th notch. Pt completes stand to sit transfer with SBA and was transported back to his room via w/c and total A. Pt left in w/c at end of session with all needs met.   Therapy Documentation Precautions:   Precautions Precautions: Fall Precaution Comments: Impulsive Restrictions Weight Bearing Restrictions: No    Therapy/Group: Individual Therapy  Marvetta Gibbons 07/17/2022, 2:32 PM

## 2022-07-17 NOTE — Progress Notes (Signed)
Patient up ambulating without assistance this morning. Staff educated resident to use call light when he is needs help to get OOB. Patient states "I am trying to do as much for myself as I can"> Nurse encouraged the importance of using the call light when he is needing help. Assisted patient to w/c placed him in it and chair alarm is on

## 2022-07-17 NOTE — Progress Notes (Signed)
Physical Therapy Session Note  Patient Details  Name: Vincent [name] MRN: [id number] Date of Birth: 08/20/1946  Today's Date: 07/17/2022 PT Individual Time: 0915-1005; 1120-1208 PT Individual Time Calculation (min): 50 min , 48 min  Short Term Goals: Week 1:  PT Short Term Goal 1 (Week 1): Pt will perform bed to chair transfer with CGA. PT Short Term Goal 2 (Week 1): Pt will ambulate x100' with CGA and LRAD. PT Short Term Goal 3 (Week 1): Pt will improved Berg by MCID.  Skilled Therapeutic Interventions/Progress Updates:  Tx 1:  Pt received sitting in wc; non slip socks wet.  He denied pain.  Pt reported that he got himself up and dressed this AM, and the floor of the BR was wet.  PT stressed the need to call before getting up; pt not receptive.  PT donned bil TEDS, and pt donned r shoe with min assist plus shoe horn, and L shoe with AFO with max assist with shoe horn.  Sit> stand with CGA to Kinetron.  neuromuscular re-education L hemibody via forced use, multimodal cues, mirror feedback, using Kinetron in sitting at 40 cm/sec x 2 min with bil UEs>0UEs.  In standing x 2 min at 50 cm/sec with max cues for wt shifting to L and L knee control.  Focused re-ed for L knee control,  biased to L standing with strap of AFO loosened, working on extension without extensor thrust into hyperextension, x 10 x 2.  Pt reported that he has a hx of bil knee problems including surgeries, and taught himself to hyperextend his knees to prevent buckling.  Gait training with RW on level tile, multiple turns, x 200' , CGA with cues for upright posture, forward gaze, avoiding L knee hyperextension with minimal carry over.  At conclusion of session, pt seated in wc with needs at hand and seat belt alarm set.  Tx 2:  Pt received sitting in wc, with seat belt alarm off of him.  He denied pain.    Therapeutic activities focused on incorporating L hemibody: Sit>< stand without use of UEs, strap of AFO loosened, x 5,  focusing on bil foot position, transfer of wt forward symmetrically.  Pt improved with practice. In sitting, reciprocal scooting forward/backward for activation of pelvic muscles, without use of UEs with feet supported > unsupported.  Reaching activity in sitting, reaching for cones out of BOS R/L with R/L hand and placing next to ipsilateral hip, to faciltate trunk shortening/lengthening/rotating.  Use of LUE only in fine motor pegboard activity in sitting including forward wt shifting and manipulation of pegs into board per picture.  Pt left out a row of pegs due to inattention; he self -corrected at the end of activity.   Gait training as above x 200', with reduced L knee hyperextension; mod cues for trunk extension to being COG over LEs.    At conclusion of session, pt seated in wc with seat belt alarm set and needs at hand.     Therapy Documentation Precautions:  Precautions Precautions: Fall Precaution Comments: Impulsive Restrictions Weight Bearing Restrictions: No   Therapy/Group: Individual Therapy  Zohal Reny 07/17/2022, 10:18 AM

## 2022-07-17 NOTE — Progress Notes (Signed)
PROGRESS NOTE   Subjective/Complaints:  LBM yesterday Walked yesterday with no assistive device-  No issues In w/c this AM- ate breakfast.    ROS:  Pt denies SOB, abd pain, CP, N/V/C/D, and vision changes   Objective:   No results found. No results for input(s): "WBC", "HGB", "HCT", "PLT" in the last 72 hours. Recent Labs    07/15/22 0508  NA 134*  K 4.1  CL 100  CO2 25  GLUCOSE 147*  BUN 21  CREATININE 0.81  CALCIUM 8.9    Intake/Output Summary (Last 24 hours) at 07/17/2022 0929 Last data filed at 07/16/2022 1700 Gross per 24 hour  Intake 472 ml  Output --  Net 472 ml        Physical Exam: Vital Signs Blood pressure (!) 145/79, pulse 84, temperature 97.7 F (36.5 C), temperature source Oral, resp. rate 16, height 5\' 8"  (1.727 m), weight 72.8 kg, SpO2 95 %.   Physical Exam   General: awake, alert, appropriate, sitting up in w/c- looks good; NAD HENT: conjugate gaze; oropharynx moist- stable nasal mass CV: regular rate; no JVD Pulmonary: CTA B/L; no W/R/R- good air movement GI: soft, NT, ND, (+)BS Psychiatric: appropriate Neurological: Ox3 .   LUE 4-/5 deltoid 4+ biceps, triceps, 4 to 4+ with wrist flexion and grip. LLE 4-/5 LLE hip flexor, 5-/5 knee extensor, 5-/5 ankle DF and PF.  RUE and RLE 5/5 prox to distal.  Sensation to light touch intact.  Skin: A few scattered bruises and abrasions. Chronic vascular changes in the distal LE's - stable   Assessment/Plan: 1. Functional deficits which require 3+ hours per day of interdisciplinary therapy in a comprehensive inpatient rehab setting. Physiatrist is providing close team supervision and 24 hour management of active medical problems listed below. Physiatrist and rehab team continue to assess barriers to discharge/monitor patient progress toward functional and medical goals  Care Tool:  Bathing  Bathing activity did not occur:  Refused Body parts bathed by patient: Right arm, Left arm, Chest, Abdomen, Buttocks, Front perineal area, Right upper leg, Left upper leg, Right lower leg, Face, Left lower leg         Bathing assist Assist Level: Minimal Assistance - Patient > 75%     Upper Body Dressing/Undressing Upper body dressing   What is the patient wearing?: Pull over shirt    Upper body assist Assist Level: Minimal Assistance - Patient > 75%    Lower Body Dressing/Undressing Lower body dressing      What is the patient wearing?: Underwear/pull up, Pants     Lower body assist Assist for lower body dressing: Minimal Assistance - Patient > 75%     Toileting Toileting    Toileting assist Assist for toileting: Supervision/Verbal cueing     Transfers Chair/bed transfer  Transfers assist  Chair/bed transfer activity did not occur: Safety/medical concerns  Chair/bed transfer assist level: Minimal Assistance - Patient > 75%     Locomotion Ambulation   Ambulation assist      Assist level: Moderate Assistance - Patient 50 - 74% Assistive device: No Device Max distance: 25'   Walk 10 feet activity   Assist  Assist level: Moderate Assistance - Patient - 50 - 74% Assistive device: No Device   Walk 50 feet activity   Assist Walk 50 feet with 2 turns activity did not occur: Safety/medical concerns         Walk 150 feet activity   Assist Walk 150 feet activity did not occur: Safety/medical concerns         Walk 10 feet on uneven surface  activity   Assist     Assist level: Minimal Assistance - Patient > 75% Assistive device:  (R hand rail)   Wheelchair     Assist Is the patient using a wheelchair?: No             Wheelchair 50 feet with 2 turns activity    Assist            Wheelchair 150 feet activity     Assist          Blood pressure (!) 145/79, pulse 84, temperature 97.7 F (36.5 C), temperature source Oral, resp. rate 16,  height 5\' 8"  (1.727 m), weight 72.8 kg, SpO2 95 %.    Medical Problem List and Plan: 1. 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 Pt with new left sided weakness although he had some prior LUE weakness from a cervical spondylosis/myelopathy (hx of ACDF)             -patient may shower             -ELOS/Goals: 7-10 days, mod I goals with PT and OT. Pt has some nearby family and friends who can check in on him. 1 level home with level entry.  Con't CIR- PT and OT 2.  Antithrombotics: -DVT/anticoagulation:  Pharmaceutical: Lovenox             -antiplatelet therapy: Aspirin and Plavix x3 weeks then Plavix alone 3. Pain Management: Tylenol as needed 4. Mood/Behavior/Sleep: Provide emotional support             -antipsychotic agents: N/A 5. Neuropsych/cognition: This patient is capable of making decisions on his own behalf. 6. Skin/Wound Care: Routine skin check              - [facility]; SW looking into rescheduling  7. Fluids/Electrolytes/Nutrition: Routine in and outs with follow-up chemistries              - LBM 10/18  8.  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  Patient on Norvasc 10 mg daily as well as Zestoretic 20-12.5 mg daily prior to admission.  Resume as needed     07/17/2022    2:51 AM 07/16/2022    9:11 PM 07/16/2022    2:31 PM  Vitals with BMI  Systolic 841 660 630  Diastolic 79 69 59  Pulse 84 68 76              -10/19: resume Norvasc at 5 mg daily          - 10/20: BP improved, monitor  10/21- BP running in 160F systolic- con't regimen 9.  Tobacco/alcohol use.  NicoDerm patch.  Provide counseling 10.  Hypothyroidism.  Synthroid. Labs WNL 10/16.  11.  Diabetes mellitus.  Hemoglobin A1c 6.8.  Resume Glucophage 500 mg twice daily             -monitor sugars AC and HS - DC d/t patient refusal   12.  Hyperlipidemia.  Lipitor 13.  Hyponatremia.  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  Continue sodium chloride tabs for now.  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 Monitor with [facility] labs unless change in mental status, diuretics, or fluid intakes  14.  History of pulmonary nodules.  Follow-up CT 3 to 6 months.    LOS: 4 days A FACE TO FACE EVALUATION WAS PERFORMED  Kawana Hegel 07/17/2022, 9:29 AM

## 2022-07-18 NOTE — Consult Note (Signed)
Neuropsychological Consultation   Patient:   Vincent [name]   DOB:   06-Sep-1946  MR Number:  [id number]  Location:  [facility] [address] CENTER [address] [id number] [place] [address] Dept: [phone] Loc: [phone]           Date of Service:   07/16/2022  Start Time:   9 AM End Time:   10 AM  Provider/Observer:  Arley Phenix, Psy.D.       Clinical Neuropsychologist       Billing Code/Service: [phone]  Chief Complaint:     Vincent [name] 76 year old male with past history of depression, diabetes, stable left upper lobe pulmonary nodules, papillary cancer, hypertension, asthma with continued tobacco/alcohol use.  Patient referred for neuropsychological consultation due to coping and adjustment issues with depressive symptoms developing post cerebrovascular accident. Patient presented on 07/09/2022 with acute onset of left-sided weakness.  While cranial CT was negative MRI showed 20 x 8 mm acute infarction right corona radiata/basal ganglia.  Therapy evaluations were completed due to patient's decreased functional mobility with left-sided weakness and admitted to [facility] for inpatient rehabilitative efforts.  Reason for Service:  Patient was referred for neuropsychological consultation due to coping and adjustment issues.  Below is the HPI for the current admission.  HPI: Vincent [name] is a 76 year old right-handed male with history of depression as well as 2012, diabetes mellitus, stable left upper lobe pulmonary nodules, papillary cancer, hypertension, asthma with tobacco/alcohol use.  Per chart review patient lives alone.  Independent prior to admission.  1 level apartment.  Presented 07/09/2022 with acute onset of left-sided weakness.  Cranial CT scan negative.  CT no emergent large vessel occlusion.  2 left upper lobe pulmonary nodules measuring 6 mm.  Recommendations noncontrast CT 3 to 6 months.  Patient did not receive  tPA.  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  Admission chemistries unremarkable except sodium 130 glucose 203, alcohol negative, urine drug screen negative, WBC 12,700, hemoglobin A1c 6.8.  Echo with ejection fraction of 60 to 65% no wall motion abnormalities. Maintained on low-dose aspirin as well as Plavix for CVA prophylaxis for 3 weeks then Plavix alone.  Subcutaneous Lovenox for DVT prophylaxis.  Venous Doppler studies negative.  Therapy evaluations completed due to patient decreased functional mobility left-sided weakness was admitted for comprehensive rehab program.  Current Status:  Patient was awake and alert and initially reporting that things are going fine and he was adjusting but with more in-depth conversations we addressed some of the issues with his frustration and feelings about ongoing deficits impacting his life going forward.  The patient was oriented x4 but displayed significant left-sided motor deficits.  Behavioral Observation: Vincent [name]  presents as a 76 y.o.-year-old Right handed  Male who appeared his stated age. his dress was Appropriate and he was Well Groomed and his manners were Appropriate to the situation.  his participation was indicative of Appropriate behaviors.  There were physical disabilities noted.  he displayed an appropriate level of cooperation and motivation.     Interactions:    Active Appropriate and Redirectable  Attention:   within normal limits and attention span and concentration were age appropriate  Memory:   within normal limits; recent and remote memory intact  Visuo-spatial:  within normal limits  Speech (Volume):  normal  Speech:   normal; slurred  Thought Process:  Coherent and Relevant  Though Content:  WNL; not suicidal  and not homicidal  Orientation:   person, place, and  time/date  Judgment:   Fair  Planning:   Fair  Affect:    Depressed  Mood:    Dysphoric  Insight:   Good  Intelligence:   normal  Medical History:   Past Medical History:  Diagnosis Date   Asthma    Depression    Diabetes mellitus    Hypertension    Numbness    Stroke ([place])    Suicide and self-inflicted injury ([place]) Aug, 2012   cut wrist         Patient Active Problem List   Diagnosis Date Noted   Stroke of right basal ganglia ([place]) 07/13/2022   Acute CVA (cerebrovascular accident) ([place]) 07/09/2022   HTN (hypertension) 07/09/2022   DM2 (diabetes mellitus, type 2) ([place]) 07/09/2022   Tobacco abuse 07/09/2022   Lower extremity edema 07/09/2022   Hypothyroidism 07/09/2022   Hyponatremia 07/09/2022   Dizziness 11/04/2012   Mild dehydration 11/04/2012   Bilateral arm weakness 10/21/2012    Class: Chronic   Alcohol dependence ([place]) 10/21/2012    Class: Chronic         Psychiatric History:  Patient acknowledged some past symptoms consistent with some depressive symptomatology but nothing particularly severe and no other psychiatric issues.  Patient on no psychotropic medications.  Family Med/Psych History: No family history on file.  Impression/DX:   Vincent [name] 76 year old male with past history of depression, diabetes, stable left upper lobe pulmonary nodules, papillary cancer, hypertension, asthma with continued tobacco/alcohol use.  Patient referred for neuropsychological consultation due to coping and adjustment issues with depressive symptoms developing post cerebrovascular accident. Patient presented on 07/09/2022 with acute onset of left-sided weakness.  While cranial CT was negative MRI showed 20 x 8 mm acute infarction right corona radiata/basal ganglia.  Therapy evaluations were completed due to patient's decreased functional mobility with left-sided weakness and admitted to [facility] for inpatient rehabilitative efforts.  Patient was awake and alert and initially  reporting that things are going fine and he was adjusting but with more in-depth conversations we addressed some of the issues with his frustration and feelings about ongoing deficits impacting his life going forward.  The patient was oriented x4 but displayed significant left-sided motor deficits.  Disposition/Plan:  Today we worked on coping and adjustment issues and the patient was able to openly indirectly address some of his concerns and all questions asked were answered as well as possible given some of the unknown factors regarding his recovery course.  Diagnosis:    Stroke of right basal ganglia [facility]) - Plan: Ambulatory referral to Neurology         Electronically Signed   _______________________ Ilean Skill, Psy.D. Clinical Neuropsychologist

## 2022-07-19 LAB — CBC
HCT: 42.6 % (ref 39.0–52.0)
Hemoglobin: 15.2 g/dL (ref 13.0–17.0)
MCH: 31.8 pg (ref 26.0–34.0)
MCHC: 35.7 g/dL (ref 30.0–36.0)
MCV: 89.1 fL (ref 80.0–100.0)
Platelets: 247 10*3/uL (ref 150–400)
RBC: 4.78 MIL/uL (ref 4.22–5.81)
RDW: 11.9 % (ref 11.5–15.5)
WBC: 11.5 10*3/uL — ABNORMAL HIGH (ref 4.0–10.5)
nRBC: 0 % (ref 0.0–0.2)

## 2022-07-19 LAB — BASIC METABOLIC PANEL
Anion gap: 9 (ref 5–15)
BUN: 18 mg/dL (ref 8–23)
CO2: 24 mmol/L (ref 22–32)
Calcium: 8.9 mg/dL (ref 8.9–10.3)
Chloride: 100 mmol/L (ref 98–111)
Creatinine, Ser: 0.86 mg/dL (ref 0.61–1.24)
GFR, Estimated: >60 mL/min (ref >60)
Glucose, Bld: 152 mg/dL — ABNORMAL HIGH (ref 70–99)
Potassium: 4.3 mmol/L (ref 3.5–5.1)
Sodium: 133 mmol/L — ABNORMAL LOW (ref 135–145)

## 2022-07-19 NOTE — Progress Notes (Signed)
Physical Therapy Session Note  Patient Details  Name: Vincent [name] MRN: [id number] Date of Birth: 08-Aug-1946  Today's Date: 07/19/2022 PT Individual Time: 1003-1059 PT Individual Time Calculation (min): 56 min   Short Term Goals: Week 1:  PT Short Term Goal 1 (Week 1): Pt will perform bed to chair transfer with CGA. PT Short Term Goal 2 (Week 1): Pt will ambulate x100' with CGA and LRAD. PT Short Term Goal 3 (Week 1): Pt will improved Berg by MCID.  Skilled Therapeutic Interventions/Progress Updates:     Pt received seated in [facility] and agrees to therapy. No complaint of pain. WC transport to gym for time management. Pt performs sit to stand with minA and cues for body mechanics and hand placement. Pt ambulates x100' without AD, with minA/modA to facilitate lateral weight shifting and stability. Pt noted to have decreased control of eccentric knee extension during stance phase, and is able to partially correct with multimodal cueing. Following seated rest break, pt completes x100' with RW and demos much improved gait mechanics, with close-to-symmetrical reciprocal stride lengths.  Pt transitions to prone on mat with feet hanging off end of table. PT provides minA and cues for positioning. In prone, pt performs hamstring curls on L with 5lb ankle weight, with cues for neutral hip rotation and increasing eccentric control of extension. Weight removed as pt reports discomfort in knees and has difficult time controlling extension. Pt completes 2x10 with tactile cues to facilitate NM control. Pt then performs 2x10 hip extensions against gravity with knee flexed to ~45 degrees, and 1x10 hip extensions with L knee fully extended.  Pt transitions to supine with cues for positioning. Pt completes 1x10 supine bridges with bilateral lower extremities, and 10 count hold on final rep. Pt then completes 2x10 bridges with R lower extremity crossed over L to focus loading through L gluteal muscles. Supine to sit with  cues for positioning. Pt performs stand pivot to [facility] with cues for hand placement and body mechanics. Pt left seated in WC with alarm intact and all needs within reach.  Therapy Documentation Precautions:  Precautions Precautions: Fall Precaution Comments: Impulsive Restrictions Weight Bearing Restrictions: No   Therapy/Group: Individual Therapy  Breck Coons, PT, DPT 07/19/2022, 4:48 PM

## 2022-07-19 NOTE — Progress Notes (Signed)
Occupational Therapy Session Note  Patient Details  Name: Vincent [name] MRN: [id number] Date of Birth: [birth year]  Today's Date: 07/19/2022 Session 1 OT Individual Time: 9758-8325 OT Individual Time Calculation (min): 72 min   Session 2 OT Individual Time: 1300-1400 OT Individual Time Calculation (min): 60 min    Short Term Goals: Week 1:  OT Short Term Goal 1 (Week 1): Patient will maintain standing balance within grooming task with CGA OT Short Term Goal 2 (Week 1): Patient will complete toilet transfer with Min A OT Short Term Goal 3 (Week 1): Patient will recall hemi dressing techniques with min questioning cues.  Skilled Therapeutic Interventions/Progress Updates:  Session 1   Pt greeted seated in wc after eating breakfast and agreeable to OT treatment session. Pt brought to therapy gym in wc. Worked on standing balance/endurance standing on foam block. Pt completed 3 sets of 10 straight arm raise standing on foam block with min A for balance and facilitation for upright posture. OT support at L knee to maintain neutral knee. Standing alternating toe taps on cone and back to number spots for  LE strength and coordination.3 sets of 10 squats with OT support on L knee. Seated ball toss with oblique twist underhand pass, chest pass, and overhead pass. Hand coordination and problem solving with seated pipe tree puzzle. Functional ambulation in hallway with RW and CGA. Pt initially with less knee hyper extension, but this this increased with fatigue. Pt returned the rest of the way in wc for time management. Pt left seated in wc with alarm belt on and needs met.   Session 2 Pt greeted seated in wc and agreeable to OT treatment session. Pt requested to shower this afternoon. Pt doffed clothing from wc with supervision and increased time. Ambulation into bathroom w/ RW and close supervision. CGA when turning into shower. Bathing with overall close supervision. OT educated on hemi dressing  strategies with pt demonstrating understanding with close supervision. Pt then ambulated to therapy gym w/ RW and CGA with cues to pick up LLE. Worked on stepping and follow through with kicking yoga block with LLE while ambulating w/ RW.Seated peg board task with focus on distal control and shoulder FF w/ peg board placed vertically on mirror. Pt ambulated back to room at end of session and left seated in wc with alarm belt on, call bell in reach and needs met.   Therapy Documentation Precautions:  Precautions Precautions: Fall Precaution Comments: Impulsive Restrictions Weight Bearing Restrictions: No Pain:  Denies pain   Therapy/Group: Individual Therapy  Valma Cava 07/19/2022, 3:33 PM

## 2022-07-19 NOTE — Progress Notes (Signed)
PROGRESS NOTE   Subjective/Complaints:  No acute concerns. No events overnight. Is asking about discharge planning; discussed how a target discharge will be established in Teams tomorrow, will follow up after that.   Does note chronic R hand bruise has expanded, states from putting his hand in his pocket.  ROS:  Pt denies SOB, abd pain, CP, N/V/C/D, and vision changes   Objective:   No results found. Recent Labs    07/19/22 0557  WBC 11.5*  HGB 15.2  HCT 42.6  PLT 247   Recent Labs    07/19/22 0557  NA 133*  K 4.3  CL 100  CO2 24  GLUCOSE 152*  BUN 18  CREATININE 0.86  CALCIUM 8.9     Intake/Output Summary (Last 24 hours) at 07/19/2022 0958 Last data filed at 07/19/2022 0757 Gross per 24 hour  Intake 477 ml  Output 450 ml  Net 27 ml         Physical Exam: Vital Signs Blood pressure (!) 148/73, pulse 72, temperature 98.1 F (36.7 C), temperature source Oral, resp. rate 16, height 5\' 8"  (1.727 m), weight 72.8 kg, SpO2 94 %.   Physical Exam   General: awake, alert, appropriate, sitting up in w/c- looks good; NAD HENT: conjugate gaze; oropharynx moist- stable nasal mass CV: regular rate; no JVD Pulmonary: CTA B/L; no W/R/R- good air movement GI: soft, NT, ND, (+)BS Psychiatric: appropriate Neurological: Ox3. Sensation to light touch intact.  MSK: RUE, RLE 4+-5-/5; LUE and LLE 5/5 Ambulating with RW CGA level  Skin: A few scattered bruises and abrasions - increase on R dorsal hand, without tension or warmth. Chronic vascular changes in the distal LE's - stable   Assessment/Plan: 1. Functional deficits which require 3+ hours per day of interdisciplinary therapy in a comprehensive inpatient rehab setting. Physiatrist is providing close team supervision and 24 hour management of active medical problems listed below. Physiatrist and rehab team continue to assess barriers to discharge/monitor  patient progress toward functional and medical goals  Care Tool:  Bathing  Bathing activity did not occur: Refused Body parts bathed by patient: Right arm, Left arm, Chest, Abdomen, Buttocks, Front perineal area, Right upper leg, Left upper leg, Right lower leg, Face, Left lower leg         Bathing assist Assist Level: Minimal Assistance - Patient > 75%     Upper Body Dressing/Undressing Upper body dressing   What is the patient wearing?: Pull over shirt    Upper body assist Assist Level: Minimal Assistance - Patient > 75%    Lower Body Dressing/Undressing Lower body dressing      What is the patient wearing?: Underwear/pull up, Pants     Lower body assist Assist for lower body dressing: Minimal Assistance - Patient > 75%     Toileting Toileting    Toileting assist Assist for toileting: Supervision/Verbal cueing     Transfers Chair/bed transfer  Transfers assist  Chair/bed transfer activity did not occur: Safety/medical concerns  Chair/bed transfer assist level: Minimal Assistance - Patient > 75%     Locomotion Ambulation   Ambulation assist      Assist level: Contact Guard/Touching assist Assistive  device: Walker-rolling Max distance: 200   Walk 10 feet activity   Assist     Assist level: Contact Guard/Touching assist Assistive device: No Device   Walk 50 feet activity   Assist Walk 50 feet with 2 turns activity did not occur: Safety/medical concerns  Assist level: Contact Guard/Touching assist      Walk 150 feet activity   Assist Walk 150 feet activity did not occur: Safety/medical concerns  Assist level: Contact Guard/Touching assist Assistive device: Walker-rolling    Walk 10 feet on uneven surface  activity   Assist     Assist level: Minimal Assistance - Patient > 75% Assistive device:  (R hand rail)   Wheelchair     Assist Is the patient using a wheelchair?: No             Wheelchair 50 feet with 2 turns  activity    Assist            Wheelchair 150 feet activity     Assist          Blood pressure (!) 148/73, pulse 72, temperature 98.1 F (36.7 C), temperature source Oral, resp. rate 16, height 5\' 8"  (1.727 m), weight 72.8 kg, SpO2 94 %.    Medical Problem List and Plan: 1. 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 Pt with new left sided weakness although he had some prior LUE weakness from a cervical spondylosis/myelopathy (hx of ACDF)             -patient may shower             -ELOS/Goals: 7-10 days, mod I goals with PT and OT. Pt has some nearby family and friends who can check in on him. 1 level home with level entry.  Con't CIR- PT and OT  2.  Antithrombotics: -DVT/anticoagulation:  Pharmaceutical: Lovenox - high caprini risk score d/t recent stroke, if ambulating Mod I >250 ft x3 can consider DC DVT ppx for bruising             -antiplatelet therapy: Aspirin and Plavix x3 weeks then Plavix alone  3. Pain Management: Tylenol as needed 4. Mood/Behavior/Sleep: Provide emotional support             -antipsychotic agents: N/A 5. Neuropsych/cognition: This patient is capable of making decisions on his own behalf. 6. Skin/Wound Care: Routine skin check              - [facility]; [facility] looking into rescheduling  7. Fluids/Electrolytes/Nutrition: Routine in and outs with follow-up chemistries              - LBM 10/22  8.  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  Patient on Norvasc 10 mg daily as well as Zestoretic 20-12.5 mg daily prior to admission.  Resume as needed     07/19/2022    3:51 AM 07/18/2022    7:21 PM 07/18/2022    2:48 PM  Vitals with BMI  Systolic [id number]  Diastolic 73 69 91  Pulse 72 74 73              -10/19: resume Norvasc at 5 mg daily          - 10/20: BP improved, monitor  - 10/21- BP running in 0000000 systolic- con't regimen            -  10/23: increase Norvasc to 10 mg daily  9.  Tobacco/alcohol use.  NicoDerm patch.  Provide counseling 10.  Hypothyroidism.  Synthroid. Labs WNL 10/16.  11.  Diabetes mellitus.  Hemoglobin A1c 6.8.  Resume Glucophage 500 mg twice daily             -monitor sugars AC and HS - DC d/t patient refusal   12.  Hyperlipidemia.  Lipitor 13.  Hyponatremia.  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  Continue sodium chloride tabs for now.  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 Monitor with [facility] labs unless change in mental status, diuretics, or fluid intakes                - 10/23: NA 133, stable.  14.  History of pulmonary nodules.  Follow-up CT 3 to 6 months.    LOS: 6 days A FACE TO FACE EVALUATION WAS PERFORMED  Gertie Gowda 07/19/2022, 9:58 AM

## 2022-07-20 NOTE — Progress Notes (Signed)
Orthopedic Tech Progress Note Patient Details:  Vincent [name] 30-Sep-1945 [id number]  Called in order to [employer] for a [employer] CAGE   Patient ID: Rahim Astorga, male   DOB: October 15, 1945, 76 y.o.   MRN: [id number]  Janit Pagan 07/20/2022, 2:50 PM

## 2022-07-20 NOTE — Progress Notes (Signed)
Patient ID: Vincent [name], male   DOB: November 15, 1945, 76 y.o.   MRN: [id number]  SW met with pt in room to provide updates from team conference, and d/c date 10/27. SW discussed delivery address since he states he states most people unable to find his address as it does not show up on the map and does not think these items could be delivered to [address]. States he will follow-up with SW about address. Pt prefers outpatient therapy at [place] [phone]). SW called this location and was informed they only offered PT. SW informed pt that SW will confirm if OT is needed.Pt doe snot want to be a burden to others. SW discussed [place] transportation. He states he is sure his brother will transport him to his appointments.  Pt aware SW will follow-up with his brother Mortimer Fries.   SW confirmed with OT that pt will need OT at discharge.   SW spoke with pt brother Mortimer Fries on above. He confirms he will transport pt to outpatient therapies. SW informed will send referral to [facility] at [place] as closet to their home.   SW faxed outpatient referral to [facility] location p:[phone]/[phone]).   SW updated [place] on above about d/c and faxed DME orders with clinicals.   SW updated pt on above changes.   *SW received updates from [place] that pt will have to be assessed with rehab medicine at the [place] first to determine if he is eligible for community care, if not, he will be required to have therapies at the [place] in [place]. SW waiting to see if pt can be scheduled for this appointment.  -Cassie indicated the appropriate consults have been sent to pt PCP for approval and pt will receive a phone call from the rehab medicine at the [facility] or a call from the community care department if eligible.   SW spoke with Sherry/[facility] [place] to discuss above about referral and to place on hold for now.   Loralee Pacas, MSW, [place] Office:  [phone] Cell: [phone] Fax: [phone]

## 2022-07-20 NOTE — Progress Notes (Signed)
Physical Therapy Session Note  Patient Details  Name: Vincent [name] MRN: [id number] Date of Birth: May 15, 1946  Today's Date: 07/20/2022 PT Individual Time: 1105-1200 and 1302-1359 PT Individual Time Calculation (min): 55 min and 57 min  Short Term Goals: Week 1:  PT Short Term Goal 1 (Week 1): Pt will perform bed to chair transfer with CGA. PT Short Term Goal 2 (Week 1): Pt will ambulate x100' with CGA and LRAD. PT Short Term Goal 3 (Week 1): Pt will improved Berg by MCID.  Skilled Therapeutic Interventions/Progress Updates:     Pt received seated in [facility] and agrees to therapy. No complaint of pain. WC transport to gym for time management. Pt tasked with performing step ups on 6" steps with bilateral hand rails. PT cues pt to ascend step with L leg first to increase loading through L hemibody, and stepping backward with R leg. Verbal cues to prevent knee from "locking" to allow for unrestricted ROM and engage quads throughout activity. Pt completes x10 prior to seated rest break. Pt progresses to performing step up with L and bring R to 2nd step to involve hip extensor on L and to progress motor pattern. Pt completes x12 prior to rest break.  Pt performs multiple bouts of ambulation with emphasis on proper mechanics, especially preventing L knee from "snapping" into extension during early/mid stance phase. Pt ambulates x140' with RW and close supervision, with cues for upright gaze to improve posture and balance, and increasing proximity to RW for safety. Following rest break, pt ambulates 2nd bout of 140'.  Pt completes standing balance and strengthening activity for lower extremities. PT demonstrates standing hamstring curls with emphasis on form and ensuring no compensations. Pt performs 1x10 with both R and L lower extremities and an additional x7 with L, though pt notably fatigued and arc of motion becomes gradually smaller.  Pt left seated in WC with alarm intact and all needs within  reach.  2nd Session: Pt received seated in [facility] and agrees to therapy. No complaint of pain. WC transport to gym for time management. Pt meets with Gerald Stabs from [place] for orthotic consultation. Pt performs sit to stand to RW with cues for hand placement. Pt ambulates x100' with RW, demonstrating extension in L knee throughout gait cycle and decreased dorsiflexion in L lower extremity. PT provides pt with swedish knee cage to prevent hyperextension and to provide pt with tactile feedback to facilitate improved motor patterns. Pt ambulates 2x120' with RW and knee cage, verbalizing improved feeling of knee stability and with less extension in L knee throughout gait cycle. Following rest break, pt performs standing activity to promote knee flexion and dorsiflexion in L lower extremity. Pt stands with staggered stance with L lower extremity extended posterior to R to promote calf stretch and dorsiflexion. Pt then brings knee to crossbar of RW to promote hip and knee flexion pattern. X10 completed. WC transport back to room. Left seated in WC with alarm intact and all needs within reach.  Therapy Documentation Precautions:  Precautions Precautions: Fall Precaution Comments: Impulsive Restrictions Weight Bearing Restrictions: No  Therapy/Group: Individual Therapy  Breck Coons, PT, DPT 07/20/2022, 5:10 PM

## 2022-07-20 NOTE — Progress Notes (Signed)
PROGRESS NOTE   Subjective/Complaints:  No acute concerns. No events overnight. Discussed DC date 10/27; patient feels this is enough time and prepared to go home. Doing very well per therapies, remains intermittently impulsive but appears baseline.   ROS:  Pt denies SOB, abd pain, CP, N/V/C/D, and vision changes   Objective:   No results found. Recent Labs    07/19/22 0557  WBC 11.5*  HGB 15.2  HCT 42.6  PLT 247    Recent Labs    07/19/22 0557  NA 133*  K 4.3  CL 100  CO2 24  GLUCOSE 152*  BUN 18  CREATININE 0.86  CALCIUM 8.9     Intake/Output Summary (Last 24 hours) at 07/20/2022 0855 Last data filed at 07/20/2022 0731 Gross per 24 hour  Intake 720 ml  Output 275 ml  Net 445 ml         Physical Exam: Vital Signs Blood pressure (!) 143/70, pulse 66, temperature 98 F (36.7 C), temperature source Oral, resp. rate 16, height 5\' 8"  (1.727 m), weight 72.8 kg, SpO2 94 %.   Physical Exam  General: awake, alert, appropriate, sitting up in w/c- looks good; NAD HENT: conjugate gaze; oropharynx moist- stable nasal mass CV: regular rate; no JVD Pulmonary: CTA B/L; no W/R/R- good air movement GI: soft, NT, ND, (+)BS Psychiatric: appropriate Neurological: Ox3.  No apparent deficits.  MSK: antigravity and against resistance all 4 extremities  Skin: A few scattered bruises and abrasions - stable R dorsal hand, without tension or warmth. Chronic vascular changes in the distal LE's - stable. No edema.    Assessment/Plan: 1. Functional deficits which require 3+ hours per day of interdisciplinary therapy in a comprehensive inpatient rehab setting. Physiatrist is providing close team supervision and 24 hour management of active medical problems listed below. Physiatrist and rehab team continue to assess barriers to discharge/monitor patient progress toward functional and medical goals  Care  Tool:  Bathing  Bathing activity did not occur: Refused Body parts bathed by patient: Right arm, Left arm, Chest, Abdomen, Buttocks, Front perineal area, Right upper leg, Left upper leg, Right lower leg, Face, Left lower leg         Bathing assist Assist Level: Minimal Assistance - Patient > 75%     Upper Body Dressing/Undressing Upper body dressing   What is the patient wearing?: Pull over shirt    Upper body assist Assist Level: Minimal Assistance - Patient > 75%    Lower Body Dressing/Undressing Lower body dressing      What is the patient wearing?: Underwear/pull up, Pants     Lower body assist Assist for lower body dressing: Minimal Assistance - Patient > 75%     Toileting Toileting    Toileting assist Assist for toileting: Supervision/Verbal cueing     Transfers Chair/bed transfer  Transfers assist  Chair/bed transfer activity did not occur: Safety/medical concerns  Chair/bed transfer assist level: Minimal Assistance - Patient > 75%     Locomotion Ambulation   Ambulation assist      Assist level: Contact Guard/Touching assist Assistive device: Walker-rolling Max distance: 200   Walk 10 feet activity   Assist  Assist level: Contact Guard/Touching assist Assistive device: No Device   Walk 50 feet activity   Assist Walk 50 feet with 2 turns activity did not occur: Safety/medical concerns  Assist level: Contact Guard/Touching assist      Walk 150 feet activity   Assist Walk 150 feet activity did not occur: Safety/medical concerns  Assist level: Contact Guard/Touching assist Assistive device: Walker-rolling    Walk 10 feet on uneven surface  activity   Assist     Assist level: Minimal Assistance - Patient > 75% Assistive device:  (R hand rail)   Wheelchair     Assist Is the patient using a wheelchair?: No             Wheelchair 50 feet with 2 turns activity    Assist            Wheelchair 150 feet  activity     Assist          Blood pressure (!) 143/70, pulse 66, temperature 98 F (36.7 C), temperature source Oral, resp. rate 16, height 5\' 8"  (1.727 m), weight 72.8 kg, SpO2 94 %.    Medical Problem List and Plan: 1. 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 Pt with new left sided weakness although he had some prior LUE weakness from a cervical spondylosis/myelopathy (hx of ACDF)             -patient may shower             -ELOS/Goals: 7-10 days, mod I goals with PT and OT. Pt has some nearby family and friends who can check in on him. 1 level home with level entry.  Con't CIR- PT and OT  - target DC 10/27            - 10/24: orthotics eval today for LLE AFO per PT for hyperextension  2.  Antithrombotics: -DVT/anticoagulation:  Pharmaceutical: Lovenox - high caprini risk score d/t recent stroke, if ambulating Mod I >250 ft x3 can consider DC DVT ppx for bruising             -antiplatelet therapy: Aspirin and Plavix x3 weeks then Plavix alone  3. Pain Management: Tylenol as needed 4. Mood/Behavior/Sleep: Provide emotional support             -antipsychotic agents: N/A 5. Neuropsych/cognition: This patient is capable of making decisions on his own behalf. 6. Skin/Wound Care: Routine skin check              - [facility]; SW looking into rescheduling  7. Fluids/Electrolytes/Nutrition: Routine in and outs with follow-up chemistries              - LBM 10/22  8.  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  Patient on Norvasc 10 mg daily as well as Zestoretic 20-12.5 mg daily prior to admission.  Resume as needed     07/20/2022    3:55 AM 07/19/2022    8:00 PM 07/19/2022    3:15 PM  Vitals with BMI  Systolic 143 139 07/21/2022  Diastolic 70 70 89  Pulse 66 65 78              -10/19: resume Norvasc at 5 mg daily          - 10/20: BP improved, monitor  - 10/21- BP running in 140s  systolic- con't regimen            -  10/23: increase Norvasc to 10 mg daily; will take 2-3 days for effect  9.  Tobacco/alcohol use.  NicoDerm patch.  Provide counseling 10.  Hypothyroidism.  Synthroid. Labs WNL 10/16.  11.  Diabetes mellitus.  Hemoglobin A1c 6.8.  Resume Glucophage 500 mg twice daily             -monitor sugars AC and HS - DC d/t patient refusal   12.  Hyperlipidemia.  Lipitor 13.  Hyponatremia.  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  Continue sodium chloride tabs for now.  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 Monitor with [facility] labs unless change in mental status, diuretics, or fluid intakes                - 10/23: NA 133, stable.                - 10/24 - repeat tomorrow given DC Friday; if stable can continue regimen as OP  14.  History of pulmonary nodules.  Follow-up CT 3 to 6 months.    LOS: 7 days A FACE TO FACE EVALUATION WAS PERFORMED  Gertie Gowda 07/20/2022, 8:55 AM

## 2022-07-20 NOTE — Patient Care Conference (Signed)
Inpatient RehabilitationTeam Conference and Plan of Care Update Date: 07/20/2022   Time: 2:48 PM    Patient Name: Vincent [name]      Medical Record Number: [id number]  Date of Birth: 07-Feb-1946 Sex: Male         Room/Bed: 4M05C/4M05C-01 Payor Info: Payor: [employer] / Plan: [place] / Product Type: *No Product type* /    Admit Date/Time:  07/13/2022  1:44 PM  Primary Diagnosis:  Stroke of right basal ganglia [facility])  Hospital Problems: Principal Problem:   Stroke of right basal ganglia [facility])    Expected Discharge Date: Expected Discharge Date: 07/23/22  Team Members Present: Physician leading conference: Other (comment) (Dr. Durel Salts, DO) Social Worker Present: Loralee Pacas, [place] Nurse Present: Other (comment) Tacy Learn, RN) PT Present: Tereasa Coop, PT OT Present: Cherylynn Ridges, OT PPS Coordinator present : Gunnar Fusi, SLP     Current Status/Progress Goal Weekly Team Focus  Bowel/Bladder   Continent of B/B  Remain Continent  Assist with toileting as needed   Swallow/Nutrition/ Hydration             ADL's   Supervision, occasional CGA  Supervision/mod i  balance, L NMR, activity tolerance   Mobility   supervision bed moiblity, CGA transfers, ambulation with RW  Mod(I)  DC prep, balance, strength   Communication             Safety/Cognition/ Behavioral Observations            Pain   No c/o pain  Pain <3/10  Assess Qshift and prn   Skin   Scattered bruising and abrasions  Promote healing, Maintain remainder of skin integrity  Assess Qshift and prn     Discharge Planning:  D/c to home with support from his brother. Pt would like to be as independent as possible, and eager to discharge.   Team Discussion: Right CVA. Continent B/B. Denies pain. Skin is CDI. CBGs not being checked d/t patient refusing. Patient has poor safety awareness. Loses balance when ambulating. Ortho consult for hyperextension of left lower  extremity. Reinforce education regarding smoking cessation, diet, and lifestyle changes.  Patient on target to meet rehab goals: yes, patient on track for discharge  *See Care Plan and progress notes for long and short-term goals.   Revisions to Treatment Plan:  Monitor labs, ortho consult  Teaching Needs: Medications, safety, skin care, diet, gait/transfer training, etc.   Current Barriers to Discharge: Decreased caregiver support, Home enviroment access/layout, Medication compliance, and Behavior  Possible Resolutions to Barriers: Family education, medication education, order recommended DME     Medical Summary Current Status: stable  Barriers to Discharge: Behavior;Decreased family/caregiver support;Home enviroment access/layout;Medication compliance;Medical stability  Barriers to Discharge Comments: family support, medical compliance, safety awareness Possible Resolutions to [employer] Focus: education/ family education   Continued Need for Acute Rehabilitation Level of Care: The patient requires daily medical management by a physician with specialized training in physical medicine and rehabilitation for the following reasons: Direction of a multidisciplinary physical rehabilitation program to maximize functional independence : Yes Medical management of patient stability for increased activity during participation in an intensive rehabilitation regime.: Yes Analysis of laboratory values and/or radiology reports with any subsequent need for medication adjustment and/or medical intervention. : Yes   I attest that I was present, lead the team conference, and concur with the assessment and plan of the team.   Ernest Pine 07/20/2022, 2:48 PM

## 2022-07-20 NOTE — Progress Notes (Signed)
Occupational Therapy Session Note  Patient Details  Name: Vincent [name] MRN: [id number] Date of Birth: 06-12-1946  Today's Date: 07/20/2022 OT Individual Time: 2574-9355 OT Individual Time Calculation (min): 75 min   Short Term Goals: Week 1:  OT Short Term Goal 1 (Week 1): Patient will maintain standing balance within grooming task with CGA OT Short Term Goal 2 (Week 1): Patient will complete toilet transfer with Min A OT Short Term Goal 3 (Week 1): Patient will recall hemi dressing techniques with min questioning cues.  Skilled Therapeutic Interventions/Progress Updates:    Pt greeted seated in wc and agreeable to OT treatment session. Pt declined need to participate in BADL tasks. Functional ambulation to therapy apartment w/ RW and close supervision and cues for step with LLE and follow through. OT set-up walk in shower transfer to simulate home environment. Pt reported he uses a wooden bar that goes across the top of shower where the shower curtain is to help him get in and out. Educated pt on side stepping technique with use of shower seat. Discussed that reaching overhead is not the safest way for shower transfer. Discussed install of grab bars and HH shower head. Practiced furniture transfers from [place] similar to his at home with cues for full anterior weight shift prior to stand, but able to get up with close supervision. Worked on L shoulder FF and balance standing on foam block and reaching outside base of support to the L to place horse shoes on basketball goal. Min A for balance but much improved L shoulder ROM to reach up. Continued standing on foam block while completing 3 sets of 10 beach ball volley with 1 lb bar. Min A for balance. L hand strength and FMC using pink theraputty and locating small beads. Pt ambulated back to room with RW and similar fashion. Pt left seated in wc with alarm belt on, call bell in reach, and needs met.  Therapy Documentation Precautions:   Precautions Precautions: Fall Precaution Comments: Impulsive Restrictions Weight Bearing Restrictions: No  Pain:  Denies pain   Therapy/Group: Individual Therapy  Valma Cava 07/20/2022, 9:54 AM

## 2022-07-21 LAB — BASIC METABOLIC PANEL
Anion gap: 11 (ref 5–15)
BUN: 18 mg/dL (ref 8–23)
CO2: 22 mmol/L (ref 22–32)
Calcium: 9.1 mg/dL (ref 8.9–10.3)
Chloride: 100 mmol/L (ref 98–111)
Creatinine, Ser: 0.81 mg/dL (ref 0.61–1.24)
GFR, Estimated: >60 mL/min (ref >60)
Glucose, Bld: 141 mg/dL — ABNORMAL HIGH (ref 70–99)
Potassium: 4 mmol/L (ref 3.5–5.1)
Sodium: 133 mmol/L — ABNORMAL LOW (ref 135–145)

## 2022-07-21 NOTE — Progress Notes (Signed)
Occupational Therapy Session Note  Patient Details  Name: Vincent [name] MRN: [id number] Date of Birth: March 19, 1946  Today's Date: 07/21/2022 Session 1:  OT Individual Time: 413-244   OT total time: 60 min  Session 2:  OT Individual Time: 0102-7253 OT Individual Time Calculation (min): 30 min    Short Term Goals: Week 1:  OT Short Term Goal 1 (Week 1): Patient will maintain standing balance within grooming task with CGA OT Short Term Goal 2 (Week 1): Patient will complete toilet transfer with Min A OT Short Term Goal 3 (Week 1): Patient will recall hemi dressing techniques with min questioning cues.  Skilled Therapeutic Interventions/Progress Updates:  Session 1:   Patient agreeable to participate in OT session. Reports 0/10 pain level.  Pt reports recent difficulty with left knee hyperextension and recently receiving a left knee brace to attempt to eliminate.  Pt already dressed for the day upon therapy arrival and awaiting his breakfast tray. Pt participated in self feeding during breakfast at Mod I level. Pt able to open all containers and packages demonstrating functional bilateral coordination.   Patient participated in skilled OT session focusing on ADL re-training and NM re-education and patient education when completing functional navigation within rehab unit. Pt with new knee brace after using trail brace. Staff PT consulted on proper application of knee brace (See photos in media tab). Overall, knee brace is not fitting properly and PT recommended Gerald Stabs (from [place]?) to look at it. While performing functional mobility with knee brace donned and utilizing RW, pt presented with poor form and management of RW. Pt demonstrated slightly flexed trunk, rounded shoulders, forward neck while RW remained too far in front. VC provided to patient to stand upright with walker closer to body while walking and pushing RW at the same time. RW height adjusted up 1 notch to facilitate an aright stand  posture. Improved standing posture noted post education and curing from OT. Required continued cues for reminders ~50% of the session. Mirrors in therapy gym used as visual aid as well to allow pt to check posture.     Session 2: Patient agreeable to participate in OT session. Reports 0/10 pain level.   Patient participated in skilled OT session focusing functional navigation and LLE strengthening in order to increase balance and safety while completing higher level balance tasks. Pt completed functional navigation to and from the main ortho gym from room with SBA utilizing RW. OT provided pt with intermittent VC for proper standing and walking form when completing functional mobility task and navigating from room to therapy gym in order to increase functional performance and safety awareness while decreasing back pain while navigating his environment.  Pt participated in LLE strengthening exercises in order to improve LLE strength and decrease risk of falls when completing self care tasks and functional transfers.   - Supine, straight leg raise, 5X with tactile cues for proper form and technique provided.  - Supine streight leg raise hold with active knee flexion and extension, 7X, max verbal and tactile cues to maintain hip flexion while completing knee flexion/extension.  - Supine, resisted hip extension, yellow band, 7X, VC to maintain a slow and controlled pace.    Therapy Documentation Precautions:  Precautions Precautions: Fall Precaution Comments: Impulsive Restrictions Weight Bearing Restrictions: No   Therapy/Group: Individual Therapy  Ailene Ravel, OTR/L,CBIS  Supplemental OT - [facility]  07/21/2022, 1:29 PM

## 2022-07-21 NOTE — Progress Notes (Signed)
PROGRESS NOTE   Subjective/Complaints:  No acute concerns. No events overnight.  Discussed easy bruising, DC lovenox if ambulating >250 ft x3; patient believes this is attainable with improved ambulation with swedish knee cage (ambulated >200 ft this AM).  ROS:  Pt denies SOB, abd pain, CP, N/V/C/D, and vision changes   Objective:   No results found. Recent Labs    07/19/22 0557  WBC 11.5*  HGB 15.2  HCT 42.6  PLT 247    Recent Labs    07/19/22 0557 07/21/22 0626  NA 133* 133*  K 4.3 4.0  CL 100 100  CO2 24 22  GLUCOSE 152* 141*  BUN 18 18  CREATININE 0.86 0.81  CALCIUM 8.9 9.1     Intake/Output Summary (Last 24 hours) at 07/21/2022 1239 Last data filed at 07/21/2022 0830 Gross per 24 hour  Intake 700 ml  Output 500 ml  Net 200 ml         Physical Exam: Vital Signs Blood pressure (!) 146/69, pulse 69, temperature 97.7 F (36.5 C), temperature source Oral, resp. rate 18, height 5\' 8"  (1.727 m), weight 72.8 kg, SpO2 95 %.   Physical Exam  General: awake, alert, appropriate, sitting up in w/c- looks good; NAD HENT: conjugate gaze; oropharynx moist- stable nasal mass CV: regular rate; no JVD Pulmonary: CTA B/L; no W/R/R- good air movement GI: soft, NT, ND, (+)BS Psychiatric: appropriate mood and affect Neurological: Ox3.  No apparent deficits.  MSK: antigravity and against resistance all 4 extremities, 5/5 throughout  Skin: A few scattered bruises and abrasions - stable R dorsal hand, without tension or warmth. Chronic vascular changes in the distal LE's - stable. No edema.    Assessment/Plan: 1. Functional deficits which require 3+ hours per day of interdisciplinary therapy in a comprehensive inpatient rehab setting. Physiatrist is providing close team supervision and 24 hour management of active medical problems listed below. Physiatrist and rehab team continue to assess barriers to  discharge/monitor patient progress toward functional and medical goals  Care Tool:  Bathing  Bathing activity did not occur: Refused Body parts bathed by patient: Right arm, Left arm, Chest, Abdomen, Buttocks, Front perineal area, Right upper leg, Left upper leg, Right lower leg, Face, Left lower leg         Bathing assist Assist Level: Minimal Assistance - Patient > 75%     Upper Body Dressing/Undressing Upper body dressing   What is the patient wearing?: Pull over shirt    Upper body assist Assist Level: Minimal Assistance - Patient > 75%    Lower Body Dressing/Undressing Lower body dressing      What is the patient wearing?: Underwear/pull up, Pants     Lower body assist Assist for lower body dressing: Minimal Assistance - Patient > 75%     Toileting Toileting    Toileting assist Assist for toileting: Supervision/Verbal cueing     Transfers Chair/bed transfer  Transfers assist  Chair/bed transfer activity did not occur: Safety/medical concerns  Chair/bed transfer assist level: Minimal Assistance - Patient > 75%     Locomotion Ambulation   Ambulation assist      Assist level: Contact Guard/Touching assist Assistive  device: Walker-rolling Max distance: 200   Walk 10 feet activity   Assist     Assist level: Contact Guard/Touching assist Assistive device: No Device   Walk 50 feet activity   Assist Walk 50 feet with 2 turns activity did not occur: Safety/medical concerns  Assist level: Contact Guard/Touching assist      Walk 150 feet activity   Assist Walk 150 feet activity did not occur: Safety/medical concerns  Assist level: Contact Guard/Touching assist Assistive device: Walker-rolling    Walk 10 feet on uneven surface  activity   Assist     Assist level: Minimal Assistance - Patient > 75% Assistive device:  (R hand rail)   Wheelchair     Assist Is the patient using a wheelchair?: No             Wheelchair 50  feet with 2 turns activity    Assist            Wheelchair 150 feet activity     Assist          Blood pressure (!) 146/69, pulse 69, temperature 97.7 F (36.5 C), temperature source Oral, resp. rate 18, height 5\' 8"  (1.727 m), weight 72.8 kg, SpO2 95 %.    Medical Problem List and Plan: 1. 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 Pt with new left sided weakness although he had some prior LUE weakness from a cervical spondylosis/myelopathy (hx of ACDF)             -patient may shower             -ELOS/Goals: 7-10 days, mod I goals with PT and OT. Pt has some nearby family and friends who can check in on him. 1 level home with level entry.  Con't CIR- PT and OT  - target DC 10/27            - 10/24: Obtained LLE swedish knee cage for hyperextension  2.  Antithrombotics: -DVT/anticoagulation:  Pharmaceutical: Lovenox - high caprini risk score d/t recent stroke, if ambulating Mod I >250 ft x3 can consider DC DVT ppx for bruising             -antiplatelet therapy: Aspirin and Plavix x3 weeks then Plavix alone  3. Pain Management: Tylenol as needed 4. Mood/Behavior/Sleep: Provide emotional support             -antipsychotic agents: N/A 5. Neuropsych/cognition: This patient is capable of making decisions on his own behalf. 6. Skin/Wound Care: Routine skin check              - [facility]; [facility] looking into rescheduling  7. Fluids/Electrolytes/Nutrition: Routine in and outs with follow-up chemistries  8.  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  Patient on Norvasc 10 mg daily as well as Zestoretic 20-12.5 mg daily prior to admission.  Resume as needed     07/21/2022    4:47 AM 07/20/2022    8:05 PM 07/20/2022    2:28 PM  Vitals with BMI  Systolic 161 096 045  Diastolic 69 60 93  Pulse 69 68 87              -10/19: resume Norvasc at 5 mg daily          - 10/20: BP  improved, monitor  - 10/21- BP running in 409W systolic- con't regimen            -  10/23: increase Norvasc to 10 mg daily; improved  9.  Tobacco/alcohol use.  NicoDerm patch.  Provide counseling 10.  Hypothyroidism.  Synthroid. Labs WNL 10/16.  11.  Diabetes mellitus.  Hemoglobin A1c 6.8.  Resume Glucophage 500 mg twice daily             -monitor sugars AC and HS - DC d/t patient refusal   12.  Hyperlipidemia.  Lipitor 13.  Hyponatremia.  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  Continue sodium chloride tabs for now.  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 Monitor with [facility] labs unless change in mental status, diuretics, or fluid intakes                - 10/23: NA 133, stable.                - 10/24 - repeat tomorrow given DC Friday; if stable can continue regimen as OP                - 10/25: AM labs stable; continue salt tabs on DC  14.  History of pulmonary nodules.  Follow-up CT 3 to 6 months.    LOS: 8 days A FACE TO FACE EVALUATION WAS PERFORMED  Angelina Sheriff 07/21/2022, 12:39 PM

## 2022-07-21 NOTE — Progress Notes (Signed)
Physical Therapy Session Note  Patient Details  Name: Vincent [name] MRN: [id number] Date of Birth: 1946/06/12  Today's Date: 07/21/2022 PT Individual Time: 1002-1059 and 8921-1941 PT Individual Time Calculation (min): 57 min and 71 min  Short Term Goals: Week 1:  PT Short Term Goal 1 (Week 1): Pt will perform bed to chair transfer with CGA. PT Short Term Goal 2 (Week 1): Pt will ambulate x100' with CGA and LRAD. PT Short Term Goal 3 (Week 1): Pt will improved Berg by MCID.  Skilled Therapeutic Interventions/Progress Updates:     1st Session: Pt received seated in [facility] and agrees to therapy. No complaint of pain. Wc transport to gym for time management. PT adjusts pt's L swedish knee cage for optimal fit. Pt performs sit to stand with cues for initiation. Pt ambulates x150' with RW and cues for upright posture to improve balance and body mechanics, increasing width of L stride due to pt tending to drift R with gait pattern, and increasing push-off with L forefoot and toes during terminal stance to promote knee flexion during swing phase. Seated rest break.  Pt performs soft tissue stretch for gastroc and soleus. Initially pt stands with RW and uses balance board to provide passive dorsiflexion stretch to L lower extremity. Pt uses bilateral upper extremity support on RW on the R for balancde and has notable R sided lean to maintain balance. Pt verbalizes fatigue in R shoulder so position adjusted for comfort. Pt wheeled into parallel bars and uses balance board with bilateral lower extremities, utilizing bilateral upper extremity support on parallel bars for more symmetrical WB and neutral posture. PT provides verbal and tactile cues for optimal posture to achieve desired soft tissue stretch, including extending hips.Following rest break, pt continues stretch of heel cords, including bending knees to target soleus. 3x1:00. Following, pt ambulates x100' and verbalizes improved feeling of ROM in L ankle.  Pt left seated in WC with alarm intact and all needs within reach.  2nd Session: Pt received seated in [facility] and agrees to therapy. No complaint of pain. WC transport to gym for time management. Pt performs stand pivot transfer to mat with verbal cues for sequencing and initiation. Pt participates in toss and catch activity with trampoline and 1kg medicine ball. Pt stands and tosses ball into trampoline and catches rebound. Pt completes x20 with cues for posture and positioning, requiring minA on final toss due to slight LOB forward. Following rest break, pt completes x20 while standing on airex pad to provide unreliable somatosensory input and promote ankle strategy for balance. Pt requires minA overall. Following rest break, pt completes x20 on airex mat with cue to toss ball from overhead to increase balance challenge as well as promote trunk extension.  PT measure's pt's thigh to assess proper size of swedish knee cage, as pt's current brace appears to large.  Pt ambulates x100' with RW and cues for upright gaze to improve posture and balance, ensuring pt achieves optimal dorsiflexion to prevent contracture, and increasing proximity to RW for safety.  Pt performs x6:00 on Nustep at workload of 6 with a average steps per minute ~55. Performed for endurance training and reciprocal coordination. Stand pivot to [facility] with cues for safe AD management. Pt left seated in WC with all needs within reach.  Therapy Documentation Precautions:  Precautions Precautions: Fall Precaution Comments: Impulsive Restrictions Weight Bearing Restrictions: No    Therapy/Group: Individual Therapy  Breck Coons, PT, DPT 07/21/2022, 3:52 PM

## 2022-07-21 NOTE — Progress Notes (Signed)
Patient ID: Vincent [name], male   DOB: 09-Jan-1946, 76 y.o.   MRN: [id number]  SW met with pt in rehab gym to inform him on expecting a call from rehab medicine with the [facility] or community care to schedule his outpatient therapy needs. Pt verbalized understanding.   Loralee Pacas, MSW, [place] Office: [phone] Cell: [phone] Fax: [phone]

## 2022-07-22 MED ORDER — ALBUTEROL SULFATE HFA 108 (90 BASE) MCG/ACT IN AERS: 2.0000 | INHALATION_SPRAY | Freq: Four times a day (QID) | RESPIRATORY_TRACT | 0 refills | Status: AC | PRN

## 2022-07-22 MED ORDER — LISINOPRIL-HYDROCHLOROTHIAZIDE 20-12.5 MG PO TABS: 1.0000 | ORAL_TABLET | Freq: Every day | ORAL | 0 refills | Status: AC

## 2022-07-22 MED ORDER — MUPIROCIN CALCIUM 2 % EX CREA
TOPICAL_CREAM | Freq: Two times a day (BID) | CUTANEOUS | Status: DC
  Filled 2022-07-22: qty 15

## 2022-07-22 MED ORDER — FOLIC ACID 1 MG PO TABS: 1.0000 mg | ORAL_TABLET | Freq: Every day | ORAL | 0 refills | Status: AC

## 2022-07-22 MED ORDER — METFORMIN HCL 500 MG PO TABS: 500.0000 mg | ORAL_TABLET | Freq: Two times a day (BID) | ORAL | 0 refills | Status: AC

## 2022-07-22 MED ORDER — CLOPIDOGREL BISULFATE 75 MG PO TABS: 75.0000 mg | ORAL_TABLET | Freq: Every day | ORAL | 0 refills | Status: AC

## 2022-07-22 MED ORDER — AMLODIPINE BESYLATE 10 MG PO TABS: 10.0000 mg | ORAL_TABLET | Freq: Every day | ORAL | 0 refills | Status: DC

## 2022-07-22 MED ORDER — ASPIRIN 81 MG PO TBEC: 81.0000 mg | DELAYED_RELEASE_TABLET | Freq: Every day | ORAL | 12 refills | Status: AC

## 2022-07-22 MED ORDER — ATORVASTATIN CALCIUM 40 MG PO TABS: 40.0000 mg | ORAL_TABLET | Freq: Every day | ORAL | 0 refills | Status: AC

## 2022-07-22 MED ORDER — ACETAMINOPHEN 325 MG PO TABS: 650.0000 mg | ORAL_TABLET | ORAL | Status: AC | PRN

## 2022-07-22 MED ORDER — NICOTINE 21 MG/24HR TD PT24: MEDICATED_PATCH | TRANSDERMAL | 0 refills | Status: AC

## 2022-07-22 MED ORDER — LEVOTHYROXINE SODIUM 125 MCG PO TABS: 125.0000 ug | ORAL_TABLET | Freq: Every day | ORAL | 0 refills | Status: AC

## 2022-07-22 MED ORDER — SODIUM CHLORIDE 1 G PO TABS: 1.0000 g | ORAL_TABLET | Freq: Two times a day (BID) | ORAL | 0 refills | Status: AC

## 2022-07-22 MED ORDER — MUPIROCIN CALCIUM 2 % EX CREA
TOPICAL_CREAM | Freq: Every day | CUTANEOUS | Status: DC
  Filled 2022-07-22: qty 15

## 2022-07-22 NOTE — Consult Note (Addendum)
[facility] Nurse Consult Note: Reason for Consult: Consult requested for left toe.  Pt states lesion has been present several months, but has grown in size and sometimes it bleeds.  Left anterior toe with a .8X.8cm black mass, atypical in appearance; discussed with patient this could possibly be a type of skin cancer and he should follow-up with a dermatologist after discharge and obtain a biopsy. [facility] does not have a dermatologist available in the acute care setting.  He verbalized understanding and states he is already seeing one at the [place] [facility] for a lesion to his nose, and he plans to follow-up with them after discharge.  Secure chat message sent to primary team to inform them of this occurrence.  Dressing procedure/placement/frequency: Topical treatment orders provided for bedside nurses to perform as follows to protect from further injury: Apply Bactroban to left 2nd toe Q day, then cover with foam dressing.  (Change foam dressing Q 3 days or PRN soiling.) Please re-consult if further assistance is needed.  Thank-you,  Julien Girt MSN, [place], [place], [place], [place]

## 2022-07-22 NOTE — Progress Notes (Signed)
Occupational Therapy Discharge Summary  Patient Details  Name: Vincent [name] MRN: [id number] Date of Birth: [birth year]  Date of Discharge from [place] 26, 2023  Today's Date: 07/22/2022 OT Individual Time: 3662-9476 OT Individual Time Calculation (min): 60 min   Pt greeted in wc and agreeable to OT treatment session. Pt ambulated to therapy gym w/ RW mod I. Pt given home UB there-ex program, home fine motor program, and theraputty exercises. Pt completed each of these exercises and activities. Educated on home modifications for safe participation in BADL tasks, RW management within iADLs, and transporting items. OT educated pt on need to be cleared by his doctor for return to driving. OT also educated pt on need for supervision from family for shower transfer to decrease fall risk in this environment. Pt verbalized understanding.  Pt ambulated back to room and left seated in wc with needs met.   Patient has met 12 of 12 long term goals due to improved activity tolerance, improved balance, postural control, ability to compensate for deficits, functional use of  LEFT upper and LEFT lower extremity, improved awareness, and improved coordination.  Patient to discharge at overall Modified Independent level.  Patient's care partner is independent to provide the necessary physical assistance at discharge for higher level iADL tasks and to provide supervision for shower transfers.     Reasons goals not met: n/a  Recommendation:  Patient will benefit from ongoing skilled OT services in outpatient setting to continue to advance functional skills in the area of BADL and functional use of L UE .  Equipment: RW, shower chair  Reasons for discharge: treatment goals met and discharge from hospital  Patient/family agrees with progress made and goals achieved: Yes  OT Discharge Precautions/Restrictions  Precautions Precautions: Fall Precaution Comments: Impulsive Restrictions Weight Bearing  Restrictions: No Pain  Denies pain ADL ADL Eating: Independent Grooming: Independent Upper Body Bathing: Modified independent Lower Body Bathing: Modified independent Upper Body Dressing: Independent Lower Body Dressing: Modified independent Toileting: Modified independent Toilet Transfer: Modified independent Social research officer, government: Close supervision Vision Additional Comments: No vision impairmnet Perception  Inattention/Neglect: Other (comment) (perception improved since eval) Praxis Praxis: Intact Cognition Cognition Overall Cognitive Status: Within Functional Limits for tasks assessed Arousal/Alertness: Awake/alert Orientation Level: Place;Person;Situation Person: Oriented Place: Oriented Situation: Oriented Memory: Appears intact Awareness: Impaired Safety/Judgment: Impaired Brief Interview for Mental Status (BIMS) Repetition of Three Words (First Attempt): 3 Temporal Orientation: Year: Correct Temporal Orientation: Month: Accurate within 5 days Temporal Orientation: Day: Correct Recall: "Sock": Yes, no cue required Recall: "Blue": Yes, no cue required Recall: "Bed": Yes, no cue required BIMS Summary Score: 15 Sensation Sensation Additional Comments: L UE numbness at baseline since neck surgery 9 years Coordination Gross Motor Movements are Fluid and Coordinated: No Fine Motor Movements are Fluid and Coordinated: No Coordination and Movement Description: Smoothness and accuracy greatly improved since eval 9 Hole Peg Test: R:35  L:50 Motor  Motor Motor: Hemiplegia Mobility  Bed Mobility Bed Mobility: Supine to Sit;Sit to Supine Supine to Sit: Independent Sit to Supine: Independent Transfers Sit to Stand: Independent with assistive device Stand to Sit: Independent with assistive device  Balance Static Sitting Balance Static Sitting - Balance Support: Feet supported Static Sitting - Level of Assistance: 7: Independent Dynamic Sitting Balance Dynamic  Sitting - Balance Support: Feet supported Dynamic Sitting - Level of Assistance: 7: Independent Static Standing Balance Static Standing - Balance Support: During functional activity Static Standing - Level of Assistance: 6: Modified independent (Device/Increase time) Dynamic Standing  Balance Dynamic Standing - Balance Support: During functional activity Dynamic Standing - Level of Assistance: 6: Modified independent (Device/Increase time);5: Stand by assistance Extremity/Trunk Assessment RUE Assessment RUE Assessment: Within Functional Limits LUE Assessment LUE Assessment: Exceptions to [facility] General Strength Comments: L UE FF ~125, still limited at shoulder 3/5, elbow,wrist,hand 4/5   Vincent [name] 07/22/2022, 9:51 AM

## 2022-07-22 NOTE — Progress Notes (Signed)
Patient ID: Vincent [name], male   DOB: [birth year], 76 y.o.   MRN: [id number]  SW waiting on updates on if DME delivered to pt home.   Loralee Pacas, MSW, [place] Office: [phone] Cell: [phone] Fax: [phone]

## 2022-07-22 NOTE — Progress Notes (Signed)
Physical Therapy Session Note  Patient Details  Name: Vincent [name] MRN: [id number] Date of Birth: 18-Mar-1946  Today's Date: 07/22/2022 PT Individual Time: 4174-0814 PT Individual Time Calculation (min): 70 min   Short Term Goals: Week 2:     Skilled Therapeutic Interventions/Progress Updates: Pt presents sitting in w/c and agreeable to therapy.  Pt wheeled to main gym for energy conservation.  Pt amb to stairs and performed 3 x 4 steps w/ supervision and 2 rails.  Verbal cues required for sequencing as pt attempts reciprocal ascension/descent w/ max weight-bearing to UE s.  Pt requires verbal cues for decreased recurvatum w/ LLE WB.  Pt amb 150-200'+ multiple trials w/ mod I but cueing for posture and maintaining appropriate positioning in RW, w/ L toe drag w/ poor positioning.  Swedish knee cage applied to L knee, personal cage has not arrived in room for use.  Pt amb > 200' w/ RW to room and remained sitting in w/c.  All needs in reach.  Pt is now independent in room.     Therapy Documentation Precautions:  Precautions Precautions: Fall Precaution Comments: Impulsive Restrictions Weight Bearing Restrictions: No General:   Vital Signs: Therapy Vitals Temp: 98.5 F (36.9 C) Pulse Rate: 72 Resp: 17 BP: (!) 149/81 Patient Position (if appropriate): Sitting Oxygen Therapy SpO2: 100 % O2 Device: Room Air Pain:no c/o pain   Mobility:   Locomotion : Stairs / Additional Locomotion Stairs: Yes Stairs Assistance: Supervision/Verbal cueing Stair Management Technique: Two rails Number of Stairs: 12 Height of Stairs: 6      Therapy/Group: Individual Therapy  [name] 07/22/2022, 2:50 PM

## 2022-07-22 NOTE — Plan of Care (Signed)

## 2022-07-22 NOTE — Progress Notes (Signed)
Inpatient Rehabilitation Care Coordinator Discharge Note   Patient Details  Name: Hilmer Aliberti MRN: [id number] Date of Birth: 02-Sep-1946   Discharge location: D/c to home  Length of Stay: 9 days  Discharge activity level: Mod I  Home/community participation: Limited  Patient response SA:YTKZSW Literacy - How often do you need to have someone help you when you read instructions, pamphlets, or other written material from your doctor or pharmacy?: Never  Patient response FU:XNATFT Isolation - How often do you feel lonely or isolated from those around you?: Never  Services provided included: MD, PT, OT, Pharmacy, SW, Neuropsych, TR, RN, SLP, CM, RD  Financial Services:  Charity fundraiser Utilized: [employer]  Choices offered to/list presented to: Yes  Follow-up services arranged:  DME, Other (Comment)      [facility] : [facility] RW and shower chair with back    Patient response to transportation need: Is the patient able to respond to transportation needs?: Yes In the past 12 months, has lack of transportation kept you from medical appointments or from getting medications?: No In the past 12 months, has lack of transportation kept you from meetings, work, or from getting things needed for daily living?: No   Comments (or additional information):  Patient/Family verbalized understanding of follow-up arrangements:  Yes  Individual responsible for coordination of the follow-up plan: contact pt  Confirmed correct DME delivered: Rana Snare 07/22/2022    Rana Snare

## 2022-07-22 NOTE — Progress Notes (Signed)
PROGRESS NOTE   Subjective/Complaints:  No events overnight. Patient complains of bleeding from left toe wound this AM; on exam, small 2nd toe mass with bleeding base. Patient states it's been present and growing for >6 months. Agreeable to discussing with dermatology at [facility] on follow up.   No other concerns, complaints.   ROS:  Pt denies SOB, abd pain, CP, N/V/C/D, and vision changes   Objective:   No results found. No results for input(s): "WBC", "HGB", "HCT", "PLT" in the last 72 hours.  Recent Labs    07/21/22 0626  NA 133*  K 4.0  CL 100  CO2 22  GLUCOSE 141*  BUN 18  CREATININE 0.81  CALCIUM 9.1     Intake/Output Summary (Last 24 hours) at 07/22/2022 1141 Last data filed at 07/22/2022 0709 Gross per 24 hour  Intake 692 ml  Output 1000 ml  Net -308 ml         Physical Exam: Vital Signs Blood pressure (!) 154/73, pulse 69, temperature 97.8 F (36.6 C), temperature source Oral, resp. rate 16, height 5\' 8"  (1.727 m), weight 72.8 kg, SpO2 96 %.   Physical Exam  General: awake, alert, appropriate, sitting up in w/c- looks good; NAD HENT: conjugate gaze; oropharynx moist- stable nasal mass CV: regular rate; no JVD Pulmonary: CTA B/L; no W/R/R- good air movement GI: soft, NT, ND, (+)BS Psychiatric: appropriate mood and affect Neurological: Ox3.  No apparent deficits.  MSK: antigravity and against resistance all 4 extremities, 5/5 throughout Gait: Slow with RW, L foot toe drags without catching, L knee hyperextension.   Skin: A few scattered bruises and abrasions - stable R dorsal hand, without tension or warmth. Chronic vascular changes in the distal LE's - stable. No edema.  L foot bleeding, somewhat mobile mass as below.  Cleaned with sterile saline and covered in gauze and tape.      Assessment/Plan: 1. Functional deficits which require 3+ hours per day of interdisciplinary therapy in a  comprehensive inpatient rehab setting. Physiatrist is providing close team supervision and 24 hour management of active medical problems listed below. Physiatrist and rehab team continue to assess barriers to discharge/monitor patient progress toward functional and medical goals  Care Tool:  Bathing  Bathing activity did not occur: Refused Body parts bathed by patient: Left arm, Right arm, Chest, Abdomen, Buttocks, Left upper leg, Front perineal area, Right upper leg, Right lower leg, Left lower leg, Face         Bathing assist Assist Level: Independent with assistive device     Upper Body Dressing/Undressing Upper body dressing   What is the patient wearing?: Pull over shirt    Upper body assist Assist Level: Independent    Lower Body Dressing/Undressing Lower body dressing      What is the patient wearing?: Underwear/pull up, Pants     Lower body assist Assist for lower body dressing: Independent with assitive device     Toileting Toileting    Toileting assist Assist for toileting: Independent with assistive device     Transfers Chair/bed transfer  Transfers assist  Chair/bed transfer activity did not occur: Safety/medical concerns  Chair/bed transfer assist level: Independent  with assistive device     Locomotion Ambulation   Ambulation assist      Assist level: Contact Guard/Touching assist Assistive device: Walker-rolling Max distance: 200   Walk 10 feet activity   Assist     Assist level: Contact Guard/Touching assist Assistive device: No Device   Walk 50 feet activity   Assist Walk 50 feet with 2 turns activity did not occur: Safety/medical concerns  Assist level: Contact Guard/Touching assist      Walk 150 feet activity   Assist Walk 150 feet activity did not occur: Safety/medical concerns  Assist level: Contact Guard/Touching assist Assistive device: Walker-rolling    Walk 10 feet on uneven surface  activity   Assist      Assist level: Minimal Assistance - Patient > 75% Assistive device:  (R hand rail)   Wheelchair     Assist Is the patient using a wheelchair?: No             Wheelchair 50 feet with 2 turns activity    Assist            Wheelchair 150 feet activity     Assist          Blood pressure (!) 154/73, pulse 69, temperature 97.8 F (36.6 C), temperature source Oral, resp. rate 16, height 5\' 8"  (1.727 m), weight 72.8 kg, SpO2 96 %.    Medical Problem List and Plan: 1. 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 Pt with new left sided weakness although he had some prior LUE weakness from a cervical spondylosis/myelopathy (hx of ACDF)             -patient may shower             -ELOS/Goals: 7-10 days, mod I goals with PT and OT. Pt has some nearby family and friends who can check in on him. 1 level home with level entry.  Con't CIR- PT and OT  - target DC 10/27            - 10/24: Obtained LLE swedish knee cage for hyperextension - too large; reordered 10/25.             - Per PT, will also need AFO but unable to obtain with current shoes, defer to [place] f/u  2.  Antithrombotics: -DVT/anticoagulation:  Pharmaceutical: Lovenox - high caprini risk score d/t recent stroke, if ambulating Mod I >250 ft x3 can consider DC DVT ppx for bruising - DC d/t bleeding 10/26             -antiplatelet therapy: Aspirin and Plavix x3 weeks then Plavix alone  3. Pain Management: Tylenol as needed 4. Mood/Behavior/Sleep: Provide emotional support             -antipsychotic agents: N/A 5. Neuropsych/cognition: This patient is capable of making decisions on his own behalf. 6. Skin/Wound Care: Routine skin check              - [facility]; [facility] looking into [facility]cheduling              - Wound eval of L toe 10/26; agree with [facility] 7. Fluids/Electrolytes/Nutrition: Routine in and outs with  follow-up chemistries  8.  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  Patient on Norvasc 10 mg daily as well as Zestoretic 20-12.5 mg daily prior to admission.  Resume as needed     07/22/2022  4:23 AM 07/21/2022    7:43 PM 07/21/2022    2:21 PM  Vitals with BMI  Systolic 154 140 540  Diastolic 73 69 73  Pulse 69 66 78              -10/19: resume Norvasc at 5 mg daily          - 10/20: BP improved, monitor  - 10/21- BP running in 140s systolic- con't regimen            - 10/23: increase Norvasc to 10 mg daily; improved  9.  Tobacco/alcohol use.  NicoDerm patch.  Provide counseling 10.  Hypothyroidism.  Synthroid. Labs WNL 10/16.  11.  Diabetes mellitus.  Hemoglobin A1c 6.8.  Resume Glucophage 500 mg twice daily             -monitor sugars AC and HS - DC d/t patient refusal   12.  Hyperlipidemia.  Lipitor 13.  Hyponatremia.  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  Continue sodium chloride tabs for now.  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 Monitor with [facility] labs unless change in mental status, diuretics, or fluid intakes                - 10/23: NA 133, stable.                - 10/24 - repeat tomorrow given DC Friday; if stable can continue regimen as OP                - 10/25: AM labs stable; continue salt tabs on DC  14.  History of pulmonary nodules.  Follow-up CT 3 to 6 months.    LOS: 9 days A FACE TO FACE EVALUATION WAS PERFORMED  Angelina Sheriff 07/22/2022, 11:41 AM

## 2022-07-22 NOTE — Progress Notes (Signed)
Physical Therapy Discharge Summary  Patient Details  Name: Vincent [name] MRN: [id number] Date of Birth: 11-Jun-1946  Date of Discharge from PT service:July 22, 2022  Today's Date: 07/22/2022 PT Individual Time: 5631-4970 PT Individual Time Calculation (min): 71 min    Patient has met 8 of 8 long term goals due to improved activity tolerance, improved balance, improved postural control, increased strength, and improved coordination.  Patient to discharge at an ambulatory level Modified Independent.    Reasons goals not met: NA  Recommendation:  Patient will benefit from ongoing skilled PT services in outpatient setting to continue to advance safe functional mobility, address ongoing impairments in strength, balance, ambulation, and minimize fall risk.  Equipment: RW  and L Swedish Knee Cage  Reasons for discharge: treatment goals met and discharge from hospital  Patient/family agrees with progress made and goals achieved: Yes  Skilled Therapeutic Interventions Pt received seated in [facility] and agrees to therapy. Reports pain in back. Number not provided. PT provides rest breaks as needed to manage pain. WC transport to gym for time management. Pt ambulates x200' with RW at [facility]). Seated rest break. MD arrives and provides check of pt's L lower extremity due to open lesion on toe. PT provides education on importance of foot hygiene due to pt reporting no pain. Following, pt ambulates x200' back to main gym with RW at [facility]). Pt performs [employer], as detailed below, demonstrating significant improvement from score on eval. WC transport back to room. Left seated with all needs within reach. RN informed that pt is now mod(I) in room.  PT Discharge Precautions/Restrictions Precautions Precautions: Fall Precaution Comments: Impulsive Restrictions Weight Bearing Restrictions: No Pain Interference Pain Interference Pain Effect on Sleep: 1. Rarely or not at all Pain Interference  with Therapy Activities: 1. Rarely or not at all Pain Interference with Day-to-Day Activities: 1. 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 Coordination Gross Motor Movements are Fluid and Coordinated: No Fine Motor Movements are Fluid and Coordinated: No Coordination and Movement Description: Smoothness and accuracy greatly improved since eval 9 Hole Peg Test: R:35  L:50 Motor  Motor Motor: Hemiplegia  Mobility Bed Mobility Bed Mobility: Supine to Sit;Sit to Supine Supine to Sit: Independent Sit to Supine: Independent Transfers Transfers: Stand to Sit;Sit to Stand;Stand Pivot Transfers Sit to Stand: Independent with assistive device Stand to Sit: Independent with assistive device Stand Pivot Transfers: Independent with assistive device Locomotion  Gait Ambulation: Yes Gait Assistance: Independent with assistive device Gait Distance (Feet): 200 Feet Assistive device: Rolling walker Gait Gait: Yes Gait Pattern: Impaired Gait Pattern:  (decreased L knee flexion, L knee hyperextension in stance phase) Gait velocity: decreased Stairs / Additional Locomotion Stairs: Yes Stairs Assistance: Supervision/Verbal cueing Stair Management Technique: Two rails Number of Stairs: 12 Height of Stairs: 6 Ramp: Independent with assistive device Curb: Supervision/Verbal cueing Wheelchair Mobility Wheelchair Mobility: No  Trunk/Postural Assessment   Cervical Assessment Cervical Assessment: Exceptions to [facility] (forward head) Thoracic Assessment Thoracic Assessment: Exceptions to [facility] (rounded shoulders) Lumbar Assessment Lumbar Assessment: Exceptions to [facility] (posterior pelvic tilt) Postural Control Postural Control: Deficits  on evaluation (delayed, but improved from eval)  Balance Static Sitting Balance Static Sitting - Balance Support: Feet supported Static Sitting - Level of Assistance: 7: Independent Dynamic Sitting Balance Dynamic Sitting - Balance Support: Feet supported Dynamic Sitting - Level of Assistance: 7: Independent Static Standing Balance Static Standing - Balance Support: During functional activity Static Standing - Level of Assistance: 6: Modified independent (Device/Increase time) Dynamic Standing Balance Dynamic Standing - Balance Support: During functional activity Dynamic Standing - Level of Assistance: 6: Modified independent (Device/Increase time);5: Stand by assistance Extremity Assessment  RUE Assessment RUE Assessment: Within Functional Limits LUE Assessment LUE Assessment: Exceptions to [facility] General Strength Comments: L UE FF ~125, still limited at shoulder 3/5, elbow,wrist,hand 4/5 RLE Assessment RLE Assessment: Within Functional Limits LLE Assessment LLE Assessment: Exceptions to [facility] General Strength Comments: Grossly 4/5   Breck Coons, PT, DPT 07/22/2022, 5:10 PM

## 2022-07-23 MED ORDER — AMLODIPINE BESYLATE 5 MG PO TABS: 5.0000 mg | ORAL_TABLET | Freq: Every day | ORAL | 0 refills | Status: AC

## 2022-07-23 MED ORDER — MUPIROCIN CALCIUM 2 % EX CREA: TOPICAL_CREAM | Freq: Every day | CUTANEOUS | 0 refills | Status: AC

## 2022-07-23 NOTE — Progress Notes (Signed)
Patient ID: Vincent [name], male   DOB: 14-Apr-1946, 76 y.o.   MRN: [id number]  07/22/22- SW received updates from [place]- that DME was ordered by prosthetics dept with [employer] this afternoon and will be shipped to his brother's home.  07/23/22-SW called pt in room to inform on above, and encouraged him to be on the lookout for DME. No questions/concerns about his discharge.   Loralee Pacas, MSW, [place] Office: [phone] Cell: [phone] Fax: [phone]

## 2022-07-23 NOTE — Progress Notes (Signed)
PROGRESS NOTE   Subjective/Complaints:  No events overnight. Attempted to see patient x2, not in room. No concerns regarding discharge.     Objective:   No results found. No results for input(s): "WBC", "HGB", "HCT", "PLT" in the last 72 hours.  Recent Labs    07/21/22 0626  NA 133*  K 4.0  CL 100  CO2 22  GLUCOSE 141*  BUN 18  CREATININE 0.81  CALCIUM 9.1     Intake/Output Summary (Last 24 hours) at 07/23/2022 0819 Last data filed at 07/23/2022 0718 Gross per 24 hour  Intake 692 ml  Output 500 ml  Net 192 ml         Physical Exam: Vital Signs Blood pressure (!) 146/78, pulse 94, temperature 97.8 F (36.6 C), temperature source Oral, resp. rate 18, height 5\' 8"  (1.727 m), weight 72.8 kg, SpO2 94 %.   Physical Exam  General: awake, alert, appropriate, sitting up in w/c- looks good; NAD HENT: conjugate gaze; oropharynx moist- stable nasal mass CV: regular rate; no JVD Pulmonary: CTA B/L; no W/R/R- good air movement GI: soft, NT, ND, (+)BS Psychiatric: appropriate mood and affect Neurological: Ox3.  No apparent deficits.  MSK: antigravity and against resistance all 4 extremities, 5/5 throughout Gait: Slow with RW, L foot toe drags without catching, L knee hyperextension.   Skin: A few scattered bruises and abrasions - stable R dorsal hand, without tension or warmth. Chronic vascular changes in the distal LE's - stable. No edema.  L foot bleeding, somewhat mobile mass as below.  Cleaned with sterile saline and covered in gauze and tape.      Assessment/Plan: 1. Functional deficits which require 3+ hours per day of interdisciplinary therapy in a comprehensive inpatient rehab setting. Physiatrist is providing close team supervision and 24 hour management of active medical problems listed below. Physiatrist and rehab team continue to assess barriers to discharge/monitor patient progress toward  functional and medical goals  Care Tool:  Bathing  Bathing activity did not occur: Refused Body parts bathed by patient: Left arm, Right arm, Chest, Abdomen, Buttocks, Left upper leg, Front perineal area, Right upper leg, Right lower leg, Left lower leg, Face         Bathing assist Assist Level: Independent with assistive device     Upper Body Dressing/Undressing Upper body dressing   What is the patient wearing?: Pull over shirt    Upper body assist Assist Level: Independent    Lower Body Dressing/Undressing Lower body dressing      What is the patient wearing?: Underwear/pull up, Pants     Lower body assist Assist for lower body dressing: Independent with assitive device     Toileting Toileting    Toileting assist Assist for toileting: Independent with assistive device     Transfers Chair/bed transfer  Transfers assist  Chair/bed transfer activity did not occur: Safety/medical concerns  Chair/bed transfer assist level: Independent with assistive device Chair/bed transfer assistive device:   Ambulation assist      Assist level: Independent with assistive device Assistive device: Walker-rolling Max distance: 200+   Walk 10 feet activity   Assist  Assist level: Independent with assistive device Assistive device: Walker-rolling   Walk 50 feet activity   Assist Walk 50 feet with 2 turns activity did not occur: Safety/medical concerns  Assist level: Independent with assistive device Assistive device: Walker-rolling    Walk 150 feet activity   Assist Walk 150 feet activity did not occur: Safety/medical concerns  Assist level: Independent with assistive device Assistive device: Walker-rolling    Walk 10 feet on uneven surface  activity   Assist     Assist level: Independent with assistive device Assistive device: Walker-rolling   Wheelchair     Assist Is the patient using a wheelchair?: No              Wheelchair 50 feet with 2 turns activity    Assist            Wheelchair 150 feet activity     Assist          Blood pressure (!) 146/78, pulse 94, temperature 97.8 F (36.6 C), temperature source Oral, resp. rate 18, height 5\' 8"  (1.727 m), weight 72.8 kg, SpO2 94 %.    Medical Problem List and Plan: 1. 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 Pt with new left sided weakness although he had some prior LUE weakness from a cervical spondylosis/myelopathy (hx of ACDF)             -patient may shower             -ELOS/Goals: 7-10 days, mod I goals with PT and OT. Pt has some nearby family and friends who can check in on him. 1 level home with level entry.  - medically stable for DC 10/27            - 10/24: Obtained LLE swedish knee cage for hyperextension - too large; reordered 10/25.             - Per PT, will also need AFO but unable to obtain with current shoes, defer to 11/25 f/u  2.  Antithrombotics: -DVT/anticoagulation:  Pharmaceutical: Lovenox - high caprini risk score d/t recent stroke, if ambulating Mod I >250 ft x3 can consider DC DVT ppx for bruising - DC d/t bleeding 10/26             -antiplatelet therapy: Aspirin and Plavix x3 weeks then Plavix alone  3. Pain Management: Tylenol as needed 4. Mood/Behavior/Sleep: Provide emotional support             -antipsychotic agents: N/A 5. Neuropsych/cognition: This patient is capable of making decisions on his own behalf. 6. Skin/Wound Care: Routine skin check              - [facility]; [facility] looking into rescheduling              - Wound eval of L toe 10/26; agree with plan for [facility] 7. Fluids/Electrolytes/Nutrition: Routine in and outs with follow-up chemistries  8.  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  Patient on Norvasc 10 mg daily as well as Zestoretic 20-12.5 mg daily prior to admission.   Resume as needed     07/23/2022    4:59 AM 07/22/2022    8:35 PM 07/22/2022    1:43 PM  Vitals with BMI  Systolic 146 137 07/24/2022  Diastolic 78 74 81  Pulse 94 69 72              -  10/19: resume Norvasc at 5 mg daily          - 10/20: BP improved, monitor  - 10/21- BP running in 371G systolic- con't regimen            - 10/23: increase Norvasc to 10 mg daily; improved  9.  Tobacco/alcohol use.  NicoDerm patch.  Provide counseling 10.  Hypothyroidism.  Synthroid. Labs WNL 10/16.  11.  Diabetes mellitus.  Hemoglobin A1c 6.8.  Resume Glucophage 500 mg twice daily             -monitor sugars AC and HS - DC d/t patient refusal   12.  Hyperlipidemia.  Lipitor 13.  Hyponatremia.  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  Continue sodium chloride tabs for now.  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 Monitor with [facility] labs unless change in mental status, diuretics, or fluid intakes                - 10/23: NA 133, stable.                - 10/24 - repeat tomorrow given DC Friday; if stable can continue regimen as OP                - 10/25: AM labs stable; continue salt tabs on DC  14.  History of pulmonary nodules.  Follow-up CT 3 to 6 months.    LOS: 10 days A FACE TO FACE EVALUATION WAS PERFORMED  Gertie Gowda 07/23/2022, 8:19 AM

## 2022-07-23 NOTE — Progress Notes (Signed)
Inpatient Rehabilitation Discharge Medication Review by a Pharmacist  A complete drug regimen review was completed for this patient to identify any potential clinically significant medication issues.  High Risk Drug Classes Is patient taking? Indication by Medication  Antipsychotic No   Anticoagulant No   Antibiotic No   Opioid No   Antiplatelet Yes Stroke prophylaxis: Aspirin and Plavix x 3 weeks then Plavix alone (aspirin to end November 2nd)  Hypoglycemics/insulin Yes Diabetes: Metformin  Vasoactive Medication Yes Hypertension: amlodipine, Zestoretic  Chemotherapy No   Other Yes Pain: Tylenol prn Nicotine dependence: Nicoderm Hypothyroidism: levothyroxine Hyperlipidemia: Lipitor Hyponatremia: salt tabs Vitamins/supplement: MVI, folic acid Shortness of breath/Wheezing: albuterol Wound care: Bactroban ointment     Type of Medication Issue Identified Description of Issue Recommendation(s)  Drug Interaction(s) (clinically significant)     Duplicate Therapy     Allergy     No Medication Administration End Date     Incorrect Dose     Additional Drug Therapy Needed     Significant med changes from prior encounter (inform family/care partners about these prior to discharge).    Other  Restarting Zestoretic after discharge. Will need follow up and monitoring shortly after discharge    Clinically significant medication issues were identified that warrant physician communication and completion of prescribed/recommended actions by midnight of the next day:  No  Name of provider notified for urgent issues identified: Dr. Durel Salts and Stann Mainland, PA  Provider Method of Notification: Secure chat   Pharmacist comments: Will need follow up and monitoring shortly after discharge since restarting Zestoretic after discharge.  Time spent performing this drug regimen review (minutes):  30   Thank you for allowing [place] to participate in this patients care. Jens Som,  PharmD 07/23/2022 8:31 AM  **Pharmacist phone directory can be found on [email or web address] listed under [place]**

## 2022-07-26 ENCOUNTER — Telehealth: Payer: Self-pay

## 2022-07-26 NOTE — Telephone Encounter (Signed)
Transition Care Management Unsuccessful Follow-up Telephone Call  Date of discharge and from where:  07/23/22 [facility]  Attempts:  1st Attempt  Reason for unsuccessful TCM follow-up call:  Left voice message

## 2022-08-02 ENCOUNTER — Encounter
Payer: No Typology Code available for payment source | Attending: Physical Medicine and Rehabilitation | Admitting: Physical Medicine and Rehabilitation

## 2022-08-23 ENCOUNTER — Ambulatory Visit: Payer: No Typology Code available for payment source | Admitting: Physical Therapy
